# Patient Record
Sex: Female | Born: 1945 | Race: White | Hispanic: No | Marital: Married | Smoking: Never smoker
Health system: Southern US, Community
[De-identification: ages and names within clinical notes are randomized; demographics above are authoritative.]

## PROBLEM LIST (undated history)

## (undated) DIAGNOSIS — Z8489 Family history of other specified conditions: Secondary | ICD-10-CM

## (undated) DIAGNOSIS — S0300XA Dislocation of jaw, unspecified side, initial encounter: Secondary | ICD-10-CM

## (undated) DIAGNOSIS — C801 Malignant (primary) neoplasm, unspecified: Secondary | ICD-10-CM

## (undated) DIAGNOSIS — G479 Sleep disorder, unspecified: Secondary | ICD-10-CM

## (undated) DIAGNOSIS — K219 Gastro-esophageal reflux disease without esophagitis: Secondary | ICD-10-CM

## (undated) DIAGNOSIS — R5383 Other fatigue: Secondary | ICD-10-CM

## (undated) DIAGNOSIS — E739 Lactose intolerance, unspecified: Secondary | ICD-10-CM

## (undated) DIAGNOSIS — C787 Secondary malignant neoplasm of liver and intrahepatic bile duct: Secondary | ICD-10-CM

## (undated) DIAGNOSIS — L719 Rosacea, unspecified: Secondary | ICD-10-CM

## (undated) DIAGNOSIS — E7800 Pure hypercholesterolemia, unspecified: Secondary | ICD-10-CM

## (undated) DIAGNOSIS — I350 Nonrheumatic aortic (valve) stenosis: Secondary | ICD-10-CM

## (undated) DIAGNOSIS — Z803 Family history of malignant neoplasm of breast: Secondary | ICD-10-CM

## (undated) DIAGNOSIS — Z800 Family history of malignant neoplasm of digestive organs: Secondary | ICD-10-CM

## (undated) DIAGNOSIS — Z780 Asymptomatic menopausal state: Secondary | ICD-10-CM

## (undated) DIAGNOSIS — M8580 Other specified disorders of bone density and structure, unspecified site: Secondary | ICD-10-CM

## (undated) DIAGNOSIS — G40909 Epilepsy, unspecified, not intractable, without status epilepticus: Secondary | ICD-10-CM

## (undated) DIAGNOSIS — I2510 Atherosclerotic heart disease of native coronary artery without angina pectoris: Secondary | ICD-10-CM

## (undated) HISTORY — DX: Other fatigue: R53.83

## (undated) HISTORY — PX: HEMANGIOMA W/ LASER EXCISION: SHX1735

## (undated) HISTORY — DX: Lactose intolerance, unspecified: E73.9

## (undated) HISTORY — PX: CORONARY ANGIOPLASTY: SHX604

## (undated) HISTORY — DX: Rosacea, unspecified: L71.9

## (undated) HISTORY — DX: Secondary malignant neoplasm of liver and intrahepatic bile duct: C78.7

## (undated) HISTORY — PX: ANKLE FRACTURE SURGERY: SHX122

## (undated) HISTORY — DX: Nonrheumatic aortic (valve) stenosis: I35.0

## (undated) HISTORY — DX: Family history of malignant neoplasm of digestive organs: Z80.0

## (undated) HISTORY — PX: TONSILLECTOMY: SUR1361

## (undated) HISTORY — DX: Pure hypercholesterolemia, unspecified: E78.00

## (undated) HISTORY — DX: Family history of malignant neoplasm of breast: Z80.3

## (undated) HISTORY — DX: Malignant (primary) neoplasm, unspecified: C80.1

## (undated) HISTORY — DX: Dislocation of jaw, unspecified side, initial encounter: S03.00XA

## (undated) HISTORY — DX: Other specified disorders of bone density and structure, unspecified site: M85.80

## (undated) HISTORY — PX: HEMANGIOMA EXCISION: SHX1734

## (undated) HISTORY — DX: Asymptomatic menopausal state: Z78.0

## (undated) HISTORY — DX: Sleep disorder, unspecified: G47.9

---

## 1997-12-07 ENCOUNTER — Other Ambulatory Visit: Admission: RE | Admit: 1997-12-07 | Discharge: 1997-12-07 | Payer: Self-pay | Admitting: *Deleted

## 1999-03-14 ENCOUNTER — Other Ambulatory Visit: Admission: RE | Admit: 1999-03-14 | Discharge: 1999-03-14 | Payer: Self-pay | Admitting: Family Medicine

## 2000-12-26 ENCOUNTER — Encounter: Payer: Self-pay | Admitting: Family Medicine

## 2000-12-26 ENCOUNTER — Ambulatory Visit (HOSPITAL_COMMUNITY): Admission: RE | Admit: 2000-12-26 | Discharge: 2000-12-26 | Payer: Self-pay | Admitting: Family Medicine

## 2001-01-29 ENCOUNTER — Encounter: Payer: Self-pay | Admitting: Family Medicine

## 2001-01-29 ENCOUNTER — Ambulatory Visit (HOSPITAL_COMMUNITY): Admission: RE | Admit: 2001-01-29 | Discharge: 2001-01-29 | Payer: Self-pay | Admitting: Family Medicine

## 2001-05-26 ENCOUNTER — Ambulatory Visit (HOSPITAL_COMMUNITY): Admission: RE | Admit: 2001-05-26 | Discharge: 2001-05-26 | Payer: Self-pay | Admitting: Gastroenterology

## 2004-02-29 ENCOUNTER — Other Ambulatory Visit: Admission: RE | Admit: 2004-02-29 | Discharge: 2004-02-29 | Payer: Self-pay | Admitting: Family Medicine

## 2005-03-12 ENCOUNTER — Other Ambulatory Visit: Admission: RE | Admit: 2005-03-12 | Discharge: 2005-03-12 | Payer: Self-pay | Admitting: Family Medicine

## 2006-03-21 ENCOUNTER — Other Ambulatory Visit: Admission: RE | Admit: 2006-03-21 | Discharge: 2006-03-21 | Payer: Self-pay | Admitting: *Deleted

## 2007-03-31 ENCOUNTER — Other Ambulatory Visit: Admission: RE | Admit: 2007-03-31 | Discharge: 2007-03-31 | Payer: Self-pay | Admitting: Family Medicine

## 2008-06-08 ENCOUNTER — Encounter: Admission: RE | Admit: 2008-06-08 | Discharge: 2008-06-08 | Payer: Self-pay | Admitting: Neurology

## 2008-12-21 ENCOUNTER — Other Ambulatory Visit: Admission: RE | Admit: 2008-12-21 | Discharge: 2008-12-21 | Payer: Self-pay | Admitting: Family Medicine

## 2010-09-03 ENCOUNTER — Encounter: Payer: Self-pay | Admitting: Neurology

## 2010-12-29 NOTE — Procedures (Signed)
[facility]  Patient:    Judy Cummings, Judy Cummings Visit Number: [id number] MRN: [id number]          Service Type: END Location: ENDO Attending Physician:  Orland Mustard Dictated by:   Llana Aliment. Randa Evens, M.D. Proc. Date: 05/26/01 Admit Date:  05/26/2001   CC:         Arvella Merles, M.D.   Procedure Report  PROCEDURE PERFORMED:  Colonoscopy.  MEDICATIONS:  Fentanyl 75 mcg, Versed 6 mg IV  INDICATION:  A strong family history of colon cancer.  Grandfather and brother both died of colon cancer.  SCOPE:  Olympus pediatric video colonoscope  DESCRIPTION OF PROCEDURE:  The procedure had been explained to the patient and consent obtained.  The patient in the left lateral decubitus position, the Olympus video colonoscope pediatric type was inserted and advanced under direct visualization. Prep was excellent able to reach the cecum without difficulty.  The ileocecal valve and appendiceal orifice were seen.  The scope was withdrawn and the cecum, ascending colon, hepatic flexure, transverse colon, splenic flexure, descending and sigmoid colon were seen well.  No polyps or any other lesions seen. There is no significant diverticular disease.  The scope was withdrawn.  The patient tolerated the procedure well. He was maintained on low flow oxygen and pulse oximetry throughout the procedure with no obvious problems.  ASSESSMENT:  Normal colonoscopy and no colon polyps in a woman with a strong family history of colon cancer.  PLAN:  Will recommend repeating in five years and yearly hemoccults. Dictated by:   Llana Aliment. Randa Evens, M.D. Attending Physician:  Orland Mustard DD:  05/26/01 TD:  05/26/01 Job: [id number]

## 2012-03-06 ENCOUNTER — Other Ambulatory Visit (HOSPITAL_COMMUNITY)
Admission: RE | Admit: 2012-03-06 | Discharge: 2012-03-06 | Disposition: A | Discharge status: Home / Self Care | Payer: Self-pay | Source: Ambulatory Visit | Attending: Family Medicine | Admitting: Family Medicine

## 2012-03-06 ENCOUNTER — Other Ambulatory Visit: Payer: Self-pay | Admitting: Physician Assistant

## 2012-03-06 DIAGNOSIS — Z0000 Encounter for general adult medical examination without abnormal findings: Secondary | ICD-10-CM | POA: Insufficient documentation

## 2014-01-27 DIAGNOSIS — E739 Lactose intolerance, unspecified: Secondary | ICD-10-CM | POA: Insufficient documentation

## 2014-01-27 DIAGNOSIS — E785 Hyperlipidemia, unspecified: Secondary | ICD-10-CM | POA: Insufficient documentation

## 2014-01-27 DIAGNOSIS — R569 Unspecified convulsions: Secondary | ICD-10-CM | POA: Insufficient documentation

## 2014-01-27 DIAGNOSIS — Z780 Asymptomatic menopausal state: Secondary | ICD-10-CM | POA: Insufficient documentation

## 2014-01-27 DIAGNOSIS — M8580 Other specified disorders of bone density and structure, unspecified site: Secondary | ICD-10-CM | POA: Insufficient documentation

## 2014-01-27 DIAGNOSIS — L719 Rosacea, unspecified: Secondary | ICD-10-CM | POA: Insufficient documentation

## 2014-01-27 DIAGNOSIS — E7800 Pure hypercholesterolemia, unspecified: Secondary | ICD-10-CM

## 2015-01-12 ENCOUNTER — Encounter: Payer: Self-pay | Admitting: Cardiology

## 2015-01-12 ENCOUNTER — Ambulatory Visit (INDEPENDENT_AMBULATORY_CARE_PROVIDER_SITE_OTHER): Payer: Medicare PPO | Admitting: Cardiology

## 2015-01-12 VITALS — BP 158/88 | HR 90 | Ht 63.0 in | Wt 122.0 lb

## 2015-01-12 DIAGNOSIS — R002 Palpitations: Secondary | ICD-10-CM | POA: Diagnosis not present

## 2015-01-12 DIAGNOSIS — T733XXA Exhaustion due to excessive exertion, initial encounter: Secondary | ICD-10-CM | POA: Insufficient documentation

## 2015-01-12 DIAGNOSIS — R0609 Other forms of dyspnea: Secondary | ICD-10-CM

## 2015-01-12 DIAGNOSIS — R011 Cardiac murmur, unspecified: Secondary | ICD-10-CM | POA: Insufficient documentation

## 2015-01-12 DIAGNOSIS — R0989 Other specified symptoms and signs involving the circulatory and respiratory systems: Secondary | ICD-10-CM

## 2015-01-12 NOTE — Patient Instructions (Signed)
Medication Instructions:  Your physician recommends that you continue on your current medications as directed. Please refer to the Current Medication list given to you today.   Labwork: None  Testing/Procedures: Your physician has requested that you have an echocardiogram. Echocardiography is a painless test that uses sound waves to create images of your heart. It provides your doctor with information about the size and shape of your heart and how well your heart's chambers and valves are working. This procedure takes approximately one hour. There are no restrictions for this procedure.   Your physician has requested that you have a carotid duplex. This test is an ultrasound of the carotid arteries in your neck. It looks at blood flow through these arteries that supply the brain with blood. Allow one hour for this exam. There are no restrictions or special instructions.   Dr. Radford Pax recommends you have a STRESS MYOVIEW.  Your physician has recommended that you wear an event monitor. Event monitors are medical devices that record the heart's electrical activity. Doctors most often [place] these monitors to diagnose arrhythmias. Arrhythmias are problems with the speed or rhythm of the heartbeat. The monitor is a small, portable device. You can wear one while you do your normal daily activities. This is usually used to diagnose what is causing palpitations/syncope (passing out).  Follow-Up: Your physician recommends that you schedule a follow-up appointment AS NEEDED with Dr. Radford Pax pending your study results.  Any Other Special Instructions Will Be Listed Below (If Applicable).

## 2015-01-12 NOTE — Progress Notes (Signed)
Cardiology Office Note   Date:  01/12/2015   ID:  Judy Cummings, DOB [birth year], MRN [id number]  PCP:  No primary care provider on file.    Chief Complaint  Patient presents with  . New Evaluation    extreme fatigue      History of Present Illness: Judy Cummings is a 69 y.o. female who presents for evaluation of exertional fatigue.  She also has felt like her heart is not beating fast enough to keep up with her and she felt SOB.  These symptoms typically occur after she has been working out in the yard for a few days, keeping grandkids and running errand.  The next day she will feel exhausted and felt that her heart was not beating fast enough.  She denies any chest pain or pressure.  She has noticed some exertional SOB when walking up a hill but is able to ride a bike on flat ground without any problems.  She has felt dizzy sometimes when out doing yard work even walking across the yard.  She denies any LE edema, PND, orthopnea or syncope.  She had a stomach flu in April and since then has had a lot of reflux symptoms with indigestion and gagging.    Past Medical History  Diagnosis Date  . Seizures   . Osteopenia   . Hypercholesterolemia   . Menopause   . Rosacea   . Lactose intolerance   . TMJ (dislocation of temporomandibular joint)   . Fatigue   . Rosacea   . TMJ (dislocation of temporomandibular joint)     Past Surgical History  Procedure Laterality Date  . Cosmetic surgery       Current Outpatient Prescriptions  Medication Sig Dispense Refill  . acetaminophen (TYLENOL) 325 MG tablet Take 650 mg by mouth every 6 (six) hours as needed for mild pain.    . Azelaic Acid 15 % cream 1 application to affected area    . carbamazepine (TEGRETOL XR) 400 MG 12 hr tablet Take 400 mg by mouth daily.    . fluticasone (FLONASE) 50 MCG/ACT nasal spray Place 1 spray into both nostrils daily. 1 spray in each nostril    . TEGRETOL-XR 200 MG 12 hr tablet Take 200 mg by  mouth daily.     . zaleplon (SONATA) 5 MG capsule Take 5 mg by mouth at bedtime as needed for sleep.     No current facility-administered medications for this visit.    Allergies:   Boniva; Erythromycin; Fosamax; Glucosamine forte; Guaifenesin & derivatives; Nexium; and Zostavax    Social History:  The patient  reports that she has never smoked. She does not have any smokeless tobacco history on file. She reports that she does not drink alcohol or use illicit drugs.   Family History:  The patient's family history includes Atrial fibrillation in her mother; CAD in her father; CVA in her father and paternal grandmother; Colon cancer in her paternal grandfather; Diabetes Mellitus I in her mother; Heart attack in her father and maternal grandfather; Hyperlipidemia in her sister; Hypertension in her mother.    ROS:  Please see the history of present illness.   Otherwise, review of systems are positive for none.   All other systems are reviewed and negative.    PHYSICAL EXAM: VS:  BP 158/88 mmHg  Pulse 90  Ht 5\' 3"  (1.6 m)  Wt  122 lb (55.339 kg)  BMI 21.62 kg/m2  SpO2 97% , BMI Body mass index is 21.62 kg/(m^2). GEN: Well nourished, well developed, in no acute distress HEENT: normal Neck: no JVD or masses.  Left carotid artery bruit Cardiac: RRR; no rubs, or gallops,no edema.  1/6 SM at RUSB to LLSB Respiratory:  clear to auscultation bilaterally, normal work of breathing GI: soft, nontender, nondistended, + BS MS: no deformity or atrophy Skin: warm and dry, no rash Neuro:  Strength and sensation are intact Psych: euthymic mood, full affect   EKG:  EKG is not ordered today.    Recent Labs: No results found for requested labs within last 365 days.    Lipid Panel No results found for: CHOL, TRIG, HDL, CHOLHDL, VLDL, LDLCALC, LDLDIRECT    Wt Readings from Last 3 Encounters:  01/12/15 122 lb (55.339 kg)       ASSESSMENT AND PLAN:  1.  Palpitations that she describes as  her heart not being able to keep up with her activities.  I will get a 30 day heart monitor to assess for arrhythmias 2.  DOE that is new for her.  It has gotten to the point that walking up any incline causes SOB.  She has a family history of CAD with her Dad having an MI at age 9.  Her mom has afib.  She has a history of dyslipidemia. She has never smoked.  Her EKG done at her PCP office is normal.  I will get a stress myoview to rule out ischemia and 2D echo to assess LVF and look for diastolic dysfunction. 3.  Exertional fagitue with Family history of CAD 4.  GERD 5.  Left carotid artery bruit - I will check carotid arterial dopplers 6.  Heart murmur - check 2D echo   Current medicines are reviewed at length with the patient today.  The patient does not have concerns regarding medicines.  The following changes have been made:  no change  Labs/ tests ordered today: See above Assessment and Plan No orders of the defined types were placed in this encounter.     Disposition:   FU with me in PRN pending results of studies  Signed, [name], MD  01/12/2015 1:35 PM    [facility] [place], [place], [place]  [address] Phone: [phone]; Fax: [phone]

## 2015-01-31 ENCOUNTER — Encounter: Payer: Self-pay | Admitting: Cardiology

## 2015-01-31 ENCOUNTER — Ambulatory Visit (HOSPITAL_COMMUNITY): Payer: Medicare PPO | Attending: Cardiology

## 2015-01-31 ENCOUNTER — Other Ambulatory Visit: Payer: Self-pay

## 2015-01-31 ENCOUNTER — Ambulatory Visit (HOSPITAL_BASED_OUTPATIENT_CLINIC_OR_DEPARTMENT_OTHER): Payer: Medicare PPO

## 2015-01-31 ENCOUNTER — Telehealth (HOSPITAL_COMMUNITY): Payer: Self-pay | Admitting: *Deleted

## 2015-01-31 DIAGNOSIS — I350 Nonrheumatic aortic (valve) stenosis: Secondary | ICD-10-CM | POA: Insufficient documentation

## 2015-01-31 DIAGNOSIS — I6523 Occlusion and stenosis of bilateral carotid arteries: Secondary | ICD-10-CM | POA: Insufficient documentation

## 2015-01-31 DIAGNOSIS — Z87891 Personal history of nicotine dependence: Secondary | ICD-10-CM | POA: Insufficient documentation

## 2015-01-31 DIAGNOSIS — R0989 Other specified symptoms and signs involving the circulatory and respiratory systems: Secondary | ICD-10-CM

## 2015-01-31 DIAGNOSIS — E785 Hyperlipidemia, unspecified: Secondary | ICD-10-CM | POA: Insufficient documentation

## 2015-01-31 DIAGNOSIS — R011 Cardiac murmur, unspecified: Secondary | ICD-10-CM

## 2015-01-31 DIAGNOSIS — R42 Dizziness and giddiness: Secondary | ICD-10-CM | POA: Diagnosis not present

## 2015-01-31 NOTE — Telephone Encounter (Signed)
Patient given detailed instructions per Myocardial Perfusion Study Information Sheet for test on 02/02/15 at 0730. Patient Notified to arrive 15 minutes early, and that it is imperative to arrive on time for appointment to keep from having the test rescheduled. Patient verbalized understanding. Malita Ignasiak, Ranae Palms

## 2015-01-31 NOTE — Telephone Encounter (Signed)
Left message on voicemail in reference to upcoming appointment scheduled for 02/02/15. Phone number given for a call back so details instructions can be given. Judy Cummings   

## 2015-02-01 ENCOUNTER — Telehealth: Payer: Self-pay

## 2015-02-01 DIAGNOSIS — I6523 Occlusion and stenosis of bilateral carotid arteries: Secondary | ICD-10-CM

## 2015-02-02 ENCOUNTER — Ambulatory Visit (HOSPITAL_COMMUNITY): Payer: Medicare PPO | Attending: Cardiology

## 2015-02-02 ENCOUNTER — Ambulatory Visit (INDEPENDENT_AMBULATORY_CARE_PROVIDER_SITE_OTHER): Payer: Medicare PPO

## 2015-02-02 DIAGNOSIS — R002 Palpitations: Secondary | ICD-10-CM | POA: Insufficient documentation

## 2015-02-02 DIAGNOSIS — R0609 Other forms of dyspnea: Secondary | ICD-10-CM | POA: Insufficient documentation

## 2015-02-02 DIAGNOSIS — I779 Disorder of arteries and arterioles, unspecified: Secondary | ICD-10-CM | POA: Diagnosis not present

## 2015-02-02 LAB — MYOCARDIAL PERFUSION IMAGING
CHL CUP NUCLEAR SSS: 6
CSEPEW: 10.4 METS
CSEPPHR: 134 {beats}/min
Exercise duration (min): 9 min
Exercise duration (sec): 16 s
LHR: 0.22
LV dias vol: 56 mL
LVSYSVOL: 20 mL
MPHR: 153 {beats}/min
Percent HR: 87 %
Rest HR: 75 {beats}/min
SDS: 6
SRS: 0
TID: 0.93

## 2015-02-02 MED ORDER — TECHNETIUM TC 99M SESTAMIBI GENERIC - CARDIOLITE
11.0000 | Freq: Once | INTRAVENOUS | Status: AC | PRN
  Administered 2015-02-02: 11 via INTRAVENOUS

## 2015-02-02 MED ORDER — TECHNETIUM TC 99M SESTAMIBI GENERIC - CARDIOLITE
33.0000 | Freq: Once | INTRAVENOUS | Status: AC | PRN
  Administered 2015-02-02: 33 via INTRAVENOUS

## 2015-02-02 MED ORDER — ASPIRIN EC 81 MG PO TBEC: 81.0000 mg | DELAYED_RELEASE_TABLET | Freq: Every day | ORAL | Status: DC

## 2015-02-02 NOTE — Telephone Encounter (Signed)
-----   Message from [name], MD sent at 01/31/2015  3:29 PM EDT ----- 40-59% carotid artery stenosis bilaterally - repeat study in 1 year

## 2015-02-02 NOTE — Telephone Encounter (Signed)
See result note.  Repeat duplex ordered to be scheduled in 1 year.  Patient knows to start 81 mg ASA daily.

## 2015-02-03 ENCOUNTER — Other Ambulatory Visit: Payer: Self-pay | Admitting: Cardiology

## 2015-02-03 ENCOUNTER — Encounter (HOSPITAL_COMMUNITY): Payer: Self-pay | Admitting: Pharmacy Technician

## 2015-02-03 ENCOUNTER — Telehealth: Payer: Self-pay

## 2015-02-03 DIAGNOSIS — R9439 Abnormal result of other cardiovascular function study: Secondary | ICD-10-CM

## 2015-02-03 DIAGNOSIS — Z01812 Encounter for preprocedural laboratory examination: Secondary | ICD-10-CM

## 2015-02-03 NOTE — Telephone Encounter (Signed)
-----   Message from [name], MD sent at 02/02/2015  7:27 PM EDT ----- Abnormal stress test with positive EKG for ischemia and nuclear with inferior ischemia - needs to be set up for Cath ASAP

## 2015-02-03 NOTE — Telephone Encounter (Signed)
Informed patient of results and verbal understanding expressed.  Scheduled patient for cath on Monday, June 27 with Dr. Angelena Form. Pre-procedure labwork scheduled for tomorrow. Patient understands to pick up instruction letter at check-in tomorrow.

## 2015-02-04 ENCOUNTER — Telehealth: Payer: Self-pay

## 2015-02-04 ENCOUNTER — Other Ambulatory Visit (INDEPENDENT_AMBULATORY_CARE_PROVIDER_SITE_OTHER): Payer: Medicare PPO | Admitting: *Deleted

## 2015-02-04 DIAGNOSIS — R931 Abnormal findings on diagnostic imaging of heart and coronary circulation: Secondary | ICD-10-CM | POA: Diagnosis not present

## 2015-02-04 DIAGNOSIS — R9439 Abnormal result of other cardiovascular function study: Secondary | ICD-10-CM

## 2015-02-04 DIAGNOSIS — Z01812 Encounter for preprocedural laboratory examination: Secondary | ICD-10-CM | POA: Diagnosis not present

## 2015-02-04 DIAGNOSIS — Z5181 Encounter for therapeutic drug level monitoring: Secondary | ICD-10-CM

## 2015-02-04 LAB — APTT: APTT: 25.9 s (ref 23.4–32.7)

## 2015-02-04 LAB — CBC WITH DIFFERENTIAL/PLATELET
Basophils Absolute: 0 10*3/uL (ref 0.0–0.1)
Basophils Relative: 0.3 % (ref 0.0–3.0)
EOS ABS: 0.2 10*3/uL (ref 0.0–0.7)
Eosinophils Relative: 2.6 % (ref 0.0–5.0)
HCT: 37.6 % (ref 36.0–46.0)
Hemoglobin: 12.4 g/dL (ref 12.0–15.0)
Lymphocytes Relative: 9.2 % — ABNORMAL LOW (ref 12.0–46.0)
Lymphs Abs: 0.8 10*3/uL (ref 0.7–4.0)
MCHC: 33.1 g/dL (ref 30.0–36.0)
MCV: 94.6 fl (ref 78.0–100.0)
MONO ABS: 0.8 10*3/uL (ref 0.1–1.0)
Monocytes Relative: 9.7 % (ref 3.0–12.0)
Neutro Abs: 6.6 10*3/uL (ref 1.4–7.7)
Neutrophils Relative %: 78.2 % — ABNORMAL HIGH (ref 43.0–77.0)
PLATELETS: 204 10*3/uL (ref 150.0–400.0)
RBC: 3.98 Mil/uL (ref 3.87–5.11)
RDW: 15.4 % (ref 11.5–15.5)
WBC: 8.4 10*3/uL (ref 4.0–10.5)

## 2015-02-04 LAB — BASIC METABOLIC PANEL
BUN: 8 mg/dL (ref 6–23)
CO2: 33 mEq/L — ABNORMAL HIGH (ref 19–32)
Calcium: 9.1 mg/dL (ref 8.4–10.5)
Chloride: 100 mEq/L (ref 96–112)
Creatinine, Ser: 0.58 mg/dL (ref 0.40–1.20)
GFR: 110.17 mL/min (ref >60.00)
GLUCOSE: 85 mg/dL (ref 70–99)
Potassium: 3.8 mEq/L (ref 3.5–5.1)
SODIUM: 137 meq/L (ref 135–145)

## 2015-02-04 LAB — PROTIME-INR
INR: 1.1 ratio — AB (ref 0.8–1.0)
Prothrombin Time: 12.1 s (ref 9.6–13.1)

## 2015-02-04 NOTE — Telephone Encounter (Signed)
Patient came in to have lab work done. Patient concerned about having elevated liver labs about a week ago with [facility] with Dr. Jonny Ruiz. Informed patient that with heart cath. that the office checks regularly BMET, CBC and INR/PT. Informed patient that I would let her primary cardiologist know about her concerns. Patient had no other questions. Will forward to Dr. Radford Pax and Dr. Standley Brooking, who is doing the heart cath.

## 2015-02-04 NOTE — Telephone Encounter (Signed)
Spoke with patient who states she had elevated liver enzymes per PCP, believed to be related to interaction between Tegretol and Omeprazole.  She was advised to stop Omeprazole and have liver enzymes rechecked in 1 week. I advised her that the cardiac cath will not interfere with her elevated liver enzymes and to follow her PCP's advice.  I advised her that she will receive sedation and be made comfortable but not put to sleep for her cath.  She verbalized understanding and agreement and thanked me for the call

## 2015-02-04 NOTE — Telephone Encounter (Signed)
Left message for patient to call back  

## 2015-02-04 NOTE — Telephone Encounter (Signed)
Follow Up      Pt calling stating that she was scheduled for a heart cath for Monday and wants to know if she will be given a medication to help her relax because she will be very anxious. Pt also states that about a week ago her PCP did some blood work and her liver levels were elivated, is she ok for surgery? Pt states her PCP will be checking those levels again in 1 week. Please call back and advise.

## 2015-02-07 ENCOUNTER — Encounter (HOSPITAL_COMMUNITY): Payer: Self-pay | Admitting: Cardiovascular Disease

## 2015-02-07 ENCOUNTER — Ambulatory Visit (HOSPITAL_COMMUNITY)
Admission: RE | Admit: 2015-02-07 | Discharge: 2015-02-08 | Disposition: A | Discharge status: Home / Self Care | Payer: Medicare PPO | Source: Ambulatory Visit | Attending: Cardiovascular Disease | Admitting: Cardiovascular Disease

## 2015-02-07 ENCOUNTER — Encounter (HOSPITAL_COMMUNITY)
Admission: RE | Disposition: A | Discharge status: Home / Self Care | Payer: Medicare PPO | Source: Ambulatory Visit | Attending: Cardiovascular Disease

## 2015-02-07 DIAGNOSIS — Z8249 Family history of ischemic heart disease and other diseases of the circulatory system: Secondary | ICD-10-CM | POA: Insufficient documentation

## 2015-02-07 DIAGNOSIS — K219 Gastro-esophageal reflux disease without esophagitis: Secondary | ICD-10-CM | POA: Insufficient documentation

## 2015-02-07 DIAGNOSIS — E780 Pure hypercholesterolemia: Secondary | ICD-10-CM | POA: Diagnosis not present

## 2015-02-07 DIAGNOSIS — I200 Unstable angina: Secondary | ICD-10-CM | POA: Diagnosis present

## 2015-02-07 DIAGNOSIS — Z955 Presence of coronary angioplasty implant and graft: Secondary | ICD-10-CM

## 2015-02-07 DIAGNOSIS — I10 Essential (primary) hypertension: Secondary | ICD-10-CM | POA: Diagnosis present

## 2015-02-07 DIAGNOSIS — R9439 Abnormal result of other cardiovascular function study: Secondary | ICD-10-CM

## 2015-02-07 DIAGNOSIS — R011 Cardiac murmur, unspecified: Secondary | ICD-10-CM | POA: Diagnosis not present

## 2015-02-07 DIAGNOSIS — R0609 Other forms of dyspnea: Secondary | ICD-10-CM

## 2015-02-07 DIAGNOSIS — E785 Hyperlipidemia, unspecified: Secondary | ICD-10-CM | POA: Diagnosis present

## 2015-02-07 DIAGNOSIS — I25119 Atherosclerotic heart disease of native coronary artery with unspecified angina pectoris: Secondary | ICD-10-CM | POA: Diagnosis present

## 2015-02-07 DIAGNOSIS — I25110 Atherosclerotic heart disease of native coronary artery with unstable angina pectoris: Secondary | ICD-10-CM | POA: Diagnosis not present

## 2015-02-07 HISTORY — PX: CARDIAC CATHETERIZATION: SHX172

## 2015-02-07 HISTORY — DX: Epilepsy, unspecified, not intractable, without status epilepticus: G40.909

## 2015-02-07 HISTORY — DX: Atherosclerotic heart disease of native coronary artery without angina pectoris: I25.10

## 2015-02-07 HISTORY — DX: Gastro-esophageal reflux disease without esophagitis: K21.9

## 2015-02-07 HISTORY — DX: Family history of other specified conditions: Z84.89

## 2015-02-07 LAB — POCT ACTIVATED CLOTTING TIME
Activated Clotting Time: 313 seconds
Activated Clotting Time: 325 seconds
Activated Clotting Time: 319 seconds

## 2015-02-07 SURGERY — LEFT HEART CATH AND CORONARY ANGIOGRAPHY
Anesthesia: LOCAL

## 2015-02-07 MED ORDER — HEPARIN SODIUM (PORCINE) 1000 UNIT/ML IJ SOLN
INTRAMUSCULAR | Status: AC
  Filled 2015-02-07: qty 1

## 2015-02-07 MED ORDER — CARBAMAZEPINE ER 200 MG PO TB12: 200.0000 mg | ORAL_TABLET | Freq: Every day | ORAL | Status: DC

## 2015-02-07 MED ORDER — MIDAZOLAM HCL 2 MG/2ML IJ SOLN
INTRAMUSCULAR | Status: AC
Start: 2015-02-07 — End: 2015-02-07
  Filled 2015-02-07: qty 2

## 2015-02-07 MED ORDER — FENTANYL CITRATE (PF) 100 MCG/2ML IJ SOLN
INTRAMUSCULAR | Status: AC
  Filled 2015-02-07: qty 2

## 2015-02-07 MED ORDER — CLOPIDOGREL BISULFATE 300 MG PO TABS
ORAL_TABLET | ORAL | Status: AC
  Filled 2015-02-07: qty 1

## 2015-02-07 MED ORDER — CARBAMAZEPINE ER 100 MG PO TB12
200.0000 mg | ORAL_TABLET | Freq: Every day | ORAL | Status: DC
  Administered 2015-02-08: 09:00:00 200 mg via ORAL
  Filled 2015-02-07: qty 2
  Filled 2015-02-07: qty 1

## 2015-02-07 MED ORDER — ACETAMINOPHEN 325 MG PO TABS
650.0000 mg | ORAL_TABLET | ORAL | Status: DC | PRN
  Administered 2015-02-08: 06:00:00 650 mg via ORAL
  Filled 2015-02-07: qty 2

## 2015-02-07 MED ORDER — ASPIRIN EC 81 MG PO TBEC
81.0000 mg | DELAYED_RELEASE_TABLET | Freq: Every day | ORAL | Status: DC
  Administered 2015-02-08: 09:00:00 81 mg via ORAL
  Filled 2015-02-07: qty 1

## 2015-02-07 MED ORDER — HEPARIN (PORCINE) IN NACL 2-0.9 UNIT/ML-% IJ SOLN
INTRAMUSCULAR | Status: AC
  Filled 2015-02-07: qty 1000

## 2015-02-07 MED ORDER — CARBAMAZEPINE ER 400 MG PO TB12: 600.0000 mg | ORAL_TABLET | Freq: Every day | ORAL | Status: DC

## 2015-02-07 MED ORDER — CARBAMAZEPINE ER 100 MG PO TB12
400.0000 mg | ORAL_TABLET | Freq: Every day | ORAL | Status: DC
  Administered 2015-02-07: 21:00:00 400 mg via ORAL
  Filled 2015-02-07: qty 4
  Filled 2015-02-07: qty 1

## 2015-02-07 MED ORDER — SODIUM CHLORIDE 0.9 % WEIGHT BASED INFUSION: 1.0000 mL/kg/h | INTRAVENOUS | Status: DC

## 2015-02-07 MED ORDER — ATORVASTATIN CALCIUM 80 MG PO TABS
80.0000 mg | ORAL_TABLET | Freq: Every day | ORAL | Status: DC
  Administered 2015-02-07: 21:00:00 80 mg via ORAL
  Filled 2015-02-07: qty 1

## 2015-02-07 MED ORDER — MIDAZOLAM HCL 2 MG/2ML IJ SOLN
INTRAMUSCULAR | Status: AC
  Filled 2015-02-07: qty 2

## 2015-02-07 MED ORDER — ASPIRIN 81 MG PO CHEW: 81.0000 mg | CHEWABLE_TABLET | ORAL | Status: DC

## 2015-02-07 MED ORDER — SODIUM CHLORIDE 0.9 % IV SOLN
250.0000 mL | INTRAVENOUS | Status: DC | PRN
Start: 2015-02-07 — End: 2015-02-07

## 2015-02-07 MED ORDER — VERAPAMIL HCL 2.5 MG/ML IV SOLN
INTRAVENOUS | Status: AC
  Filled 2015-02-07: qty 2

## 2015-02-07 MED ORDER — SODIUM CHLORIDE 0.9 % IV SOLN: 250.0000 mL | INTRAVENOUS | Status: DC | PRN

## 2015-02-07 MED ORDER — CARBAMAZEPINE ER 200 MG PO TB12
200.0000 mg | ORAL_TABLET | Freq: Every day | ORAL | Status: DC
  Filled 2015-02-07 (×2): qty 1

## 2015-02-07 MED ORDER — SODIUM CHLORIDE 0.9 % IV SOLN: INTRAVENOUS | Status: AC

## 2015-02-07 MED ORDER — CLOPIDOGREL BISULFATE 300 MG PO TABS
ORAL_TABLET | ORAL | Status: DC | PRN
  Administered 2015-02-07: 600 mg via ORAL

## 2015-02-07 MED ORDER — SODIUM CHLORIDE 0.9 % IJ SOLN: 3.0000 mL | Freq: Two times a day (BID) | INTRAMUSCULAR | Status: DC

## 2015-02-07 MED ORDER — SODIUM CHLORIDE 0.9 % IJ SOLN
3.0000 mL | Freq: Two times a day (BID) | INTRAMUSCULAR | Status: DC
  Administered 2015-02-07: 3 mL via INTRAVENOUS

## 2015-02-07 MED ORDER — FENTANYL CITRATE (PF) 100 MCG/2ML IJ SOLN
INTRAMUSCULAR | Status: DC | PRN
  Administered 2015-02-07 (×2): 25 ug via INTRAVENOUS
  Administered 2015-02-07: 50 ug via INTRAVENOUS

## 2015-02-07 MED ORDER — SODIUM CHLORIDE 0.9 % WEIGHT BASED INFUSION
3.0000 mL/kg/h | INTRAVENOUS | Status: DC
Start: 2015-02-08 — End: 2015-02-07

## 2015-02-07 MED ORDER — ANGIOPLASTY BOOK
Freq: Once | Status: AC
  Administered 2015-02-07: 20:00:00
  Filled 2015-02-07: qty 1

## 2015-02-07 MED ORDER — SODIUM CHLORIDE 0.9 % IJ SOLN: 3.0000 mL | INTRAMUSCULAR | Status: DC | PRN

## 2015-02-07 MED ORDER — CLOPIDOGREL BISULFATE 75 MG PO TABS
75.0000 mg | ORAL_TABLET | Freq: Every day | ORAL | Status: DC
  Filled 2015-02-07: qty 1

## 2015-02-07 MED ORDER — CARBAMAZEPINE ER 400 MG PO TB12: 400.0000 mg | ORAL_TABLET | Freq: Every day | ORAL | Status: DC

## 2015-02-07 MED ORDER — LIDOCAINE HCL (PF) 1 % IJ SOLN
INTRAMUSCULAR | Status: AC
  Filled 2015-02-07: qty 30

## 2015-02-07 MED ORDER — ALUM & MAG HYDROXIDE-SIMETH 200-200-20 MG/5ML PO SUSP
30.0000 mL | ORAL | Status: DC | PRN
  Administered 2015-02-07: 30 mL via ORAL
  Filled 2015-02-07 (×2): qty 30

## 2015-02-07 MED ORDER — NITROGLYCERIN 1 MG/10 ML FOR IR/CATH LAB
INTRA_ARTERIAL | Status: DC | PRN
  Administered 2015-02-07 (×2): 200 ug via INTRACORONARY
  Administered 2015-02-07: 100 ug via INTRACORONARY
  Administered 2015-02-07: 200 ug via INTRACORONARY

## 2015-02-07 MED ORDER — VERAPAMIL HCL 2.5 MG/ML IV SOLN
INTRAVENOUS | Status: DC | PRN
  Administered 2015-02-07: 09:00:00 via INTRA_ARTERIAL

## 2015-02-07 MED ORDER — NITROGLYCERIN 1 MG/10 ML FOR IR/CATH LAB
INTRA_ARTERIAL | Status: AC
  Filled 2015-02-07: qty 10

## 2015-02-07 MED ORDER — MIDAZOLAM HCL 2 MG/2ML IJ SOLN
INTRAMUSCULAR | Status: DC | PRN
  Administered 2015-02-07: 1 mg via INTRAVENOUS
  Administered 2015-02-07: 2 mg via INTRAVENOUS
  Administered 2015-02-07: 1 mg via INTRAVENOUS

## 2015-02-07 MED ORDER — FLUTICASONE PROPIONATE 50 MCG/ACT NA SUSP
1.0000 | Freq: Every day | NASAL | Status: DC
  Filled 2015-02-07: qty 16

## 2015-02-07 MED ORDER — HEPARIN SODIUM (PORCINE) 1000 UNIT/ML IJ SOLN
INTRAMUSCULAR | Status: DC | PRN
  Administered 2015-02-07: 7000 [IU] via INTRAVENOUS
  Administered 2015-02-07: 2000 [IU] via INTRAVENOUS
  Administered 2015-02-07: 3000 [IU] via INTRAVENOUS

## 2015-02-07 MED ORDER — ONDANSETRON HCL 4 MG/2ML IJ SOLN: 4.0000 mg | Freq: Four times a day (QID) | INTRAMUSCULAR | Status: DC | PRN

## 2015-02-07 SURGICAL SUPPLY — 29 items
BALLN EMERGE MR 2.0X12 (BALLOONS) ×2
BALLN EMERGE MR 2.0X20 (BALLOONS) ×2
BALLN ~~LOC~~ EMERGE MR 2.25X20 (BALLOONS) ×2
BALLN ~~LOC~~ EMERGE MR 2.25X8 (BALLOONS) ×1 IMPLANT
BALLOON EMERGE MR 2.0X12 (BALLOONS) IMPLANT
BALLOON EMERGE MR 2.0X20 (BALLOONS) IMPLANT
BALLOON ~~LOC~~ EMERGE MR 2.25X20 (BALLOONS) IMPLANT
CATH INFINITI 5 FR JL3.5 (CATHETERS) ×2 IMPLANT
CATH INFINITI 5FR ANG PIGTAIL (CATHETERS) ×2 IMPLANT
CATH INFINITI 5FR MULTPACK ANG (CATHETERS) IMPLANT
CATH INFINITI JR4 5F (CATHETERS) ×2 IMPLANT
CATH VISTA GUIDE 6FR AL1 (CATHETERS) ×1 IMPLANT
CATH VISTA GUIDE 6FR XBLAD3.5 (CATHETERS) ×1 IMPLANT
DEVICE RAD COMP TR BAND LRG (VASCULAR PRODUCTS) ×3 IMPLANT
GLIDESHEATH SLEND SS 6F .021 (SHEATH) ×2 IMPLANT
KIT ENCORE 26 ADVANTAGE (KITS) ×1 IMPLANT
KIT HEART LEFT (KITS) ×2 IMPLANT
PACK CARDIAC CATHETERIZATION (CUSTOM PROCEDURE TRAY) ×2 IMPLANT
SHEATH PINNACLE 5F 10CM (SHEATH) IMPLANT
STENT PROMUS PREM MR 2.25X12 (Permanent Stent) ×2 IMPLANT
STENT PROMUS PREM MR 2.25X32 (Permanent Stent) ×1 IMPLANT
SYR MEDRAD MARK V 150ML (SYRINGE) ×2 IMPLANT
TRANSDUCER W/STOPCOCK (MISCELLANEOUS) ×2 IMPLANT
TUBING CIL FLEX 10 FLL-RA (TUBING) ×2 IMPLANT
WIRE COUGAR XT STRL 190CM (WIRE) ×2 IMPLANT
WIRE EMERALD 3MM-J .035X150CM (WIRE) IMPLANT
WIRE HI TORQ VERSACORE-J 145CM (WIRE) ×1 IMPLANT
WIRE MAILMAN 182CM (WIRE) ×1 IMPLANT
WIRE SAFE-T 1.5MM-J .035X260CM (WIRE) ×2 IMPLANT

## 2015-02-07 NOTE — Progress Notes (Signed)
Patient had slight bruising proximal to site on admission to unit. Bruising noted and pressure held x two with 2 cc's added to band. After 2nd episode bruised area wrapped after pressure held and area soft. 1230 wrap removed and area proximal bruised but soft. Noted swelling after wrap removed and manual pressure held. Wrapped for additional 30 minutes. When wrap removed swelling again, manual pressure held and Dr. Angelena Form paged at 1400. Called lab as instructed and cath lab personnel up to assess and treat bruising and hematoma.  Blood pressure cuff put around bruised swollen area and inflated for 2 minutes by Sherlyn Lick and area softer. Dr. Ellyn Hack into see patient and 2nd TR band applied proximal to original TR band and bands adjusted by Dr. Ellyn Hack. Instructed to slowly deflate each band distal first then proximal and apply ice to proximal area to bands for comfort. Area stable with no further swelling noted.  CSMs to right hand wnls continuously with no deficit noted and radial and ulnar pulses continuously palpable. Oximetry to right thumb continuous and good pleth noted. No deficit in circulation to extremity during shift noted. Patient reported less discomfort after intervention by [facility] and Dr. Ellyn Hack. Ice pack decreased discomfort, applied intermittently for comfort.  Removed band and noted bruising and tenderness. Area soft with no swelling or palpable hematoma at 1800.  Report to Roselle Zarsona at 1900.

## 2015-02-07 NOTE — H&P (View-Only) (Signed)
Cardiology Office Note   Date:  01/12/2015   ID:  Judy Cummings, DOB Dec 19, 1945, MRN [id number]  PCP:  No primary care provider on file.    Chief Complaint  Patient presents with  . New Evaluation    extreme fatigue      History of Present Illness: Judy Cummings is a 69 y.o. female who presents for evaluation of exertional fatigue.  She also has felt like her heart is not beating fast enough to keep up with her and she felt SOB.  These symptoms typically occur after she has been working out in the yard for a few days, keeping grandkids and running errand.  The next day she will feel exhausted and felt that her heart was not beating fast enough.  She denies any chest pain or pressure.  She has noticed some exertional SOB when walking up a hill but is able to ride a bike on flat ground without any problems.  She has felt dizzy sometimes when out doing yard work even walking across the yard.  She denies any LE edema, PND, orthopnea or syncope.  She had a stomach flu in April and since then has had a lot of reflux symptoms with indigestion and gagging.    Past Medical History  Diagnosis Date  . Seizures   . Osteopenia   . Hypercholesterolemia   . Menopause   . Rosacea   . Lactose intolerance   . TMJ (dislocation of temporomandibular joint)   . Fatigue   . Rosacea   . TMJ (dislocation of temporomandibular joint)     Past Surgical History  Procedure Laterality Date  . Cosmetic surgery       Current Outpatient Prescriptions  Medication Sig Dispense Refill  . acetaminophen (TYLENOL) 325 MG tablet Take 650 mg by mouth every 6 (six) hours as needed for mild pain.    . Azelaic Acid 15 % cream 1 application to affected area    . carbamazepine (TEGRETOL XR) 400 MG 12 hr tablet Take 400 mg by mouth daily.    . fluticasone (FLONASE) 50 MCG/ACT nasal spray Place 1 spray into both nostrils daily. 1 spray in each nostril    . TEGRETOL-XR 200 MG 12 hr tablet Take 200 mg by  mouth daily.     . zaleplon (SONATA) 5 MG capsule Take 5 mg by mouth at bedtime as needed for sleep.     No current facility-administered medications for this visit.    Allergies:   Boniva; Erythromycin; Fosamax; Glucosamine forte; Guaifenesin & derivatives; Nexium; and Zostavax    Social History:  The patient  reports that she has never smoked. She does not have any smokeless tobacco history on file. She reports that she does not drink alcohol or use illicit drugs.   Family History:  The patient's family history includes Atrial fibrillation in her mother; CAD in her father; CVA in her father and paternal grandmother; Colon cancer in her paternal grandfather; Diabetes Mellitus I in her mother; Heart attack in her father and maternal grandfather; Hyperlipidemia in her sister; Hypertension in her mother.    ROS:  Please see the history of present illness.   Otherwise, review of systems are positive for none.   All other systems are reviewed and negative.    PHYSICAL EXAM: VS:  BP 158/88 mmHg  Pulse 90  Ht 5\' 3"  (1.6 m)  Wt  122 lb (55.339 kg)  BMI 21.62 kg/m2  SpO2 97% , BMI Body mass index is 21.62 kg/(m^2). GEN: Well nourished, well developed, in no acute distress HEENT: normal Neck: no JVD or masses.  Left carotid artery bruit Cardiac: RRR; no rubs, or gallops,no edema.  1/6 SM at RUSB to LLSB Respiratory:  clear to auscultation bilaterally, normal work of breathing GI: soft, nontender, nondistended, + BS MS: no deformity or atrophy Skin: warm and dry, no rash Neuro:  Strength and sensation are intact Psych: euthymic mood, full affect   EKG:  EKG is not ordered today.    Recent Labs: No results found for requested labs within last 365 days.    Lipid Panel No results found for: CHOL, TRIG, HDL, CHOLHDL, VLDL, LDLCALC, LDLDIRECT    Wt Readings from Last 3 Encounters:  01/12/15 122 lb (55.339 kg)       ASSESSMENT AND PLAN:  1.  Palpitations that she describes as  her heart not being able to keep up with her activities.  I will get a 30 day heart monitor to assess for arrhythmias 2.  DOE that is new for her.  It has gotten to the point that walking up any incline causes SOB.  She has a family history of CAD with her Dad having an MI at age 40.  Her mom has afib.  She has a history of dyslipidemia. She has never smoked.  Her EKG done at her PCP office is normal.  I will get a stress myoview to rule out ischemia and 2D echo to assess LVF and look for diastolic dysfunction. 3.  Exertional fagitue with Family history of CAD 4.  GERD 5.  Left carotid artery bruit - I will check carotid arterial dopplers 6.  Heart murmur - check 2D echo   Current medicines are reviewed at length with the patient today.  The patient does not have concerns regarding medicines.  The following changes have been made:  no change  Labs/ tests ordered today: See above Assessment and Plan No orders of the defined types were placed in this encounter.     Disposition:   FU with me in PRN pending results of studies  Signed, [name], MD  01/12/2015 1:35 PM    [facility] [place], [place], [place]  [address] Phone: [phone]; Fax: [phone]

## 2015-02-07 NOTE — Progress Notes (Signed)
Stent card reviewed and given to husband [name].

## 2015-02-07 NOTE — Research (Signed)
CAD LAD Informed Consent   Subject Name: Judy Cummings  Subject met inclusion and exclusion criteria.  The informed consent form, study requirements and expectations were reviewed with the subject and questions and concerns were addressed prior to the signing of the consent form.  The subject verbalized understanding of the trail requirements.  The subject agreed to participate in the CAD LAD trial and signed the informed consent.  The informed consent was obtained prior to performance of any protocol-specific procedures for the subject.  A copy of the signed informed consent was given to the subject and a copy was placed in the subject's medical record.  Jakyron Fabro 02/07/2015, 07:20 AM

## 2015-02-07 NOTE — Interval H&P Note (Signed)
History and Physical Interval Note:  02/07/2015 7:26 AM  Judy Cummings  has presented today for cardiac cath with the diagnosis of unstable angina, abnornal stress myoview  The various methods of treatment have been discussed with the patient and family. After consideration of risks, benefits and other options for treatment, the patient has consented to  Procedure(s): Left Heart Cath and Coronary Angiography (N/A) as a surgical intervention .  The patient's history has been reviewed, patient examined, no change in status, stable for surgery.  I have reviewed the patient's chart and labs.  Questions were answered to the patient's satisfaction.    Cath Lab Visit (complete for each Cath Lab visit)  Clinical Evaluation Leading to the Procedure:   ACS: No.  Non-ACS:    Anginal Classification: CCS II  Anti-ischemic medical therapy: No Therapy  Non-Invasive Test Results: Intermediate-risk stress test findings: cardiac mortality 1-3%/year  Prior CABG: No previous CABG         [name]

## 2015-02-08 DIAGNOSIS — K219 Gastro-esophageal reflux disease without esophagitis: Secondary | ICD-10-CM | POA: Diagnosis not present

## 2015-02-08 DIAGNOSIS — I10 Essential (primary) hypertension: Secondary | ICD-10-CM | POA: Diagnosis not present

## 2015-02-08 DIAGNOSIS — I200 Unstable angina: Secondary | ICD-10-CM | POA: Diagnosis not present

## 2015-02-08 DIAGNOSIS — E780 Pure hypercholesterolemia: Secondary | ICD-10-CM | POA: Diagnosis not present

## 2015-02-08 DIAGNOSIS — R0609 Other forms of dyspnea: Secondary | ICD-10-CM

## 2015-02-08 DIAGNOSIS — I25110 Atherosclerotic heart disease of native coronary artery with unstable angina pectoris: Secondary | ICD-10-CM | POA: Diagnosis not present

## 2015-02-08 LAB — BASIC METABOLIC PANEL
Anion gap: 5 (ref 5–15)
CHLORIDE: 101 mmol/L (ref 101–111)
CO2: 29 mmol/L (ref 22–32)
Calcium: 8.5 mg/dL — ABNORMAL LOW (ref 8.9–10.3)
Creatinine, Ser: 0.56 mg/dL (ref 0.44–1.00)
GFR calc Af Amer: >60 mL/min (ref >60)
GFR calc non Af Amer: >60 mL/min (ref >60)
GLUCOSE: 105 mg/dL — AB (ref 65–99)
POTASSIUM: 4.1 mmol/L (ref 3.5–5.1)
Sodium: 135 mmol/L (ref 135–145)

## 2015-02-08 LAB — CBC
HCT: 34 % — ABNORMAL LOW (ref 36.0–46.0)
Hemoglobin: 11.5 g/dL — ABNORMAL LOW (ref 12.0–15.0)
MCH: 31.6 pg (ref 26.0–34.0)
MCHC: 33.8 g/dL (ref 30.0–36.0)
MCV: 93.4 fL (ref 78.0–100.0)
Platelets: 180 10*3/uL (ref 150–400)
RBC: 3.64 MIL/uL — AB (ref 3.87–5.11)
RDW: 14.5 % (ref 11.5–15.5)
WBC: 8.5 10*3/uL (ref 4.0–10.5)

## 2015-02-08 MED ORDER — TICAGRELOR 90 MG PO TABS
180.0000 mg | ORAL_TABLET | Freq: Once | ORAL | Status: AC
  Administered 2015-02-08: 09:00:00 180 mg via ORAL
  Filled 2015-02-08: qty 2

## 2015-02-08 MED ORDER — LISINOPRIL 5 MG PO TABS
5.0000 mg | ORAL_TABLET | Freq: Every day | ORAL | Status: DC
  Administered 2015-02-08: 15:00:00 5 mg via ORAL
  Filled 2015-02-08: qty 1

## 2015-02-08 MED ORDER — ATORVASTATIN CALCIUM 80 MG PO TABS: 80.0000 mg | ORAL_TABLET | Freq: Every day | ORAL | Status: DC

## 2015-02-08 MED ORDER — METOPROLOL TARTRATE 12.5 MG HALF TABLET
12.5000 mg | ORAL_TABLET | Freq: Two times a day (BID) | ORAL | Status: DC
  Administered 2015-02-08: 09:00:00 12.5 mg via ORAL
  Filled 2015-02-08: qty 1

## 2015-02-08 MED ORDER — CLOPIDOGREL BISULFATE 75 MG PO TABS: 75.0000 mg | ORAL_TABLET | Freq: Every day | ORAL | Status: DC

## 2015-02-08 MED ORDER — LISINOPRIL 5 MG PO TABS: 5.0000 mg | ORAL_TABLET | Freq: Every day | ORAL | Status: DC

## 2015-02-08 MED ORDER — TICAGRELOR 90 MG PO TABS: 90.0000 mg | ORAL_TABLET | Freq: Two times a day (BID) | ORAL | Status: DC

## 2015-02-08 MED ORDER — ALUM & MAG HYDROXIDE-SIMETH 200-200-20 MG/5ML PO SUSP
30.0000 mL | ORAL | Status: DC | PRN
  Administered 2015-02-08: 10:00:00 30 mL via ORAL

## 2015-02-08 MED ORDER — METOPROLOL TARTRATE 12.5 MG HALF TABLET: 12.5000 mg | ORAL_TABLET | Freq: Two times a day (BID) | ORAL | Status: DC

## 2015-02-08 MED ORDER — METOPROLOL TARTRATE 25 MG PO TABS: 12.5000 mg | ORAL_TABLET | Freq: Two times a day (BID) | ORAL | Status: AC

## 2015-02-08 MED FILL — Heparin Sodium (Porcine) 2 Unit/ML in Sodium Chloride 0.9%: INTRAMUSCULAR | Qty: 500 | Status: AC

## 2015-02-08 MED FILL — Lidocaine HCl Local Preservative Free (PF) Inj 1%: INTRAMUSCULAR | Qty: 30 | Status: AC

## 2015-02-08 NOTE — Progress Notes (Signed)
Subjective: Right wrist sore.  Objective: Vital signs in last 24 hours: Temp:  [97.7 F (36.5 C)-98.7 F (37.1 C)] 98.6 F (37 C) (06/28 0526) Pulse Rate:  [0-109] 80 (06/28 0526) Resp:  [0-69] 20 (06/28 0526) BP: (140-199)/(68-101) 165/76 mmHg (06/28 0625) SpO2:  [0 %-100 %] 98 % (06/28 0526) Weight:  [117 lb 1 oz (53.1 kg)-120 lb (54.432 kg)] 117 lb 1 oz (53.1 kg) (06/28 0007) Last BM Date: 02/07/15  Intake/Output from previous day: 06/27 0701 - 06/28 0700 In: 930 [P.O.:480; I.V.:450] Out: 2700 [Urine:2700] Intake/Output this shift: Total I/O In: -  Out: 1750 [Urine:1750]  Medications Scheduled Meds: . aspirin EC  81 mg Oral Daily  . atorvastatin  80 mg Oral q1800  . carbamazepine  200 mg Oral Daily  . carbamazepine  400 mg Oral QHS  . clopidogrel  75 mg Oral Q breakfast  . fluticasone  1 spray Each Nare Daily  . metoprolol tartrate  12.5 mg Oral BID  . sodium chloride  3 mL Intravenous Q12H   Continuous Infusions:  PRN Meds:.sodium chloride, acetaminophen, alum & mag hydroxide-simeth, ondansetron (ZOFRAN) IV, sodium chloride  PE: General appearance: alert, cooperative and no distress Lungs: clear to auscultation bilaterally Heart: regular rate and rhythm and 1/6 sys MM RSB Extremities: No LEE Pulses: 2+ and symmetric Skin: Warm and dry.  Large area of ecchymosis at the right wrist cath site.  Sore Neurologic: Grossly normal  Lab Results:   Recent Labs  02/08/15 0510  WBC 8.5  HGB 11.5*  HCT 34.0*  PLT 180   BMET  Recent Labs  02/08/15 0510  NA 135  K 4.1  CL 101  CO2 29  GLUCOSE 105*  BUN <5*  CREATININE 0.56  CALCIUM 8.5*    Ost RPDA lesion, 95% stenosed.  Prox RCA lesion, 40% stenosed.  Mid Cx lesion, 20% stenosed.  Prox LAD to Mid LAD lesion, 40% stenosed.  The left ventricular systolic function is normal.  Dist LAD lesion, 99% stenosed. There is a 0% residual stenosis post intervention. The lesion was previously treated  with a stent (unknown type) .  A drug-eluting stent was placed.  Mid LAD to Dist LAD lesion, 99% stenosed. There is a 0% residual stenosis post intervention.  A drug-eluting stent was placed.  Mid RCA-1 lesion, 40% stenosed.  Mid RCA-2 lesion, 99% stenosed. There is a 0% residual stenosis post intervention.  A drug-eluting stent was placed.   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  She underwent successful PTCA/DES x 2 mid LAD.  ASA, plavix, statin .  Add lopressor 12.5 bid.  Titrate up.  Cardiac rehab this morning and Phase II.  SCr WNL.   HAGER, BRYAN PA-C 02/08/2015 6:46 AM  i added lisinopril 5mg  also.  BP still high  HAGER, BRYAN, PAC 10:00 AM  I have examined the patient and reviewed assessment and plan and discussed with patient.  Agree with above as stated.  Mild bruise at right wrist.  Pulse present in the right wrist. Doing well post cath.  Plan dsicharge after cardiac rehab eval.  SHe is interested in the education from cardiac rehab and also in dietary changes.   I personally reviewed tele.  Adding ACE-I for HTN.  Maurina Fawaz  S.

## 2015-02-08 NOTE — Discharge Summary (Signed)
Physician Discharge Summary     Cardiologist:  Turner  Patient ID: Judy Cummings MRN: [id number] DOB/AGE: 08-28-1945 69 y.o.  Admit date: 02/07/2015 Discharge date: 02/08/2015  Admission Diagnoses:  DOE, Unstable angina  Discharge Diagnoses:  Active Problems:   Hypercholesterolemia   DOE (dyspnea on exertion)   Unstable angina   Essential hypertension   Discharged Condition: stable  Hospital Course:   Judy Cummings is a 69 y.o. female who presents for evaluation of exertional fatigue. She also has felt like her heart is not beating fast enough to keep up with her and she felt SOB. These symptoms typically occur after she has been working out in the yard for a few days, keeping grandkids and running errand. The next day she will feel exhausted and felt that her heart was not beating fast enough. She denies any chest pain or pressure. She has noticed some exertional SOB when walking up a hill but is able to ride a bike on flat ground without any problems. She has felt dizzy sometimes when out doing yard work even walking across the yard. She denies any LE edema, PND, orthopnea or syncope. She had a stomach flu in April and since then has had a lot of reflux symptoms with indigestion and gagging.  She underwent a myoview stress test which was interpreted as moderate risk with a small area of moderate inferior wall ischemia from apex to base.  The patient was then scheduled for and underwent a left heart cath which revealed severe double vessel CAD with severe stenosis mid LAD and severe stenosis mid RCA, normal LV systolic function. She underwent successful PTCA/DES x 2 mid LAD. ASA, plavix, statin were started.  The patient was apparently enrolled in the TWILIGHT trial and plavix was discontinued by the study RN.  We added lopressor 12.5 bid and lisinopril 5mg  for HTN.  Follow in the office. Cardiac rehab with recommendation for Phase II. SCr WNL post cath.  The patient was seen by Dr.  Irish Lack who felt she was stable for DC home.   Consults: Cardiac rehab  Significant Diagnostic Studies:   Left Heart cath  PCI Note:   Lesion #1 mid LAD: XB LAD 3.5 guiding catheter. Additional 7000 units IV heparin. ACT over 200. Cougar IC wire down LAD. 2.0 x 20 mm balloon x 1 mid LAD x 1. 2.25 x 32 Promus Premier deployed mid LAD. The stent was post-dilated with a 2.25 x 20 mm [place] balloon x 2. I was unable to deliver a stent to the distal segment. I initially used another Cougar wire as a buddy wire but could not deliver the stent. I then put down a Mailman wire into the distal LAD. A 2.0 x 20 mm balloon was used to pre-dilate the distal segment. I then delivered and deployed a 2.25 x 12 mm Promus Premier DES in the distal LAD overlapping the first stent. The stents were post-dilated with the 2.25 x 20 mm [place] balloon x 2. The stenosis was taken from 99% in three locations down to 0% in all locations. TIMI-3 flow pre and post stent.   Lesion #2 mid RCA: AR1 guiding catheter. Cougar IC wire down the RCA. 2.0 x 12 mm balloon x 1 in mid RCA. A 2.25 x 12 mm Promus Premier DES was deployed in the mid RCA. The stent was post-dilated with a 2.25 x 8 mm [place] balloon x 1. The stenosis was taken from 99% down to 0%. TIMI-3 flow pre  and post stent.   The sheath was removed from the right radial artery and a Terumo hemostasis band was applied at the arteriotomy site on the right wrist.    Conclusion     Ost RPDA lesion, 95% stenosed.  Prox RCA lesion, 40% stenosed.  Mid Cx lesion, 20% stenosed.  Prox LAD to Mid LAD lesion, 40% stenosed.  The left ventricular systolic function is normal.  Dist LAD lesion, 99% stenosed. There is a 0% residual stenosis post intervention. The lesion was previously treated with a stent (unknown type) .  A drug-eluting stent was placed.  Mid LAD to Dist LAD lesion, 99% stenosed. There is a 0% residual stenosis post intervention.  A drug-eluting stent was  placed.  Mid RCA-1 lesion, 40% stenosed.  Mid RCA-2 lesion, 99% stenosed. There is a 0% residual stenosis post intervention.  A drug-eluting stent was placed.  1. Severe double vessel CAD with severe stenosis mid LAD and severe stenosis mid RCA 2. Normal LV systolic function 3. Unstable angina 4. Successful PTCA/DES x 2 mid LAD 5. Successful PTCA/DES x 1 mid RCA  Recommendations: Will continue ASA and Plavix for at least one year. Will start statin.       Treatments:  See above  Discharge Exam: Blood pressure 158/72, pulse 71, temperature 98 F (36.7 C), temperature source Oral, resp. rate 20, height 5\' 3"  (1.6 m), weight 117 lb 1 oz (53.1 kg), SpO2 98 %.  Disposition: Final discharge disposition not confirmed      Discharge Instructions    Amb Referral to Cardiac Rehabilitation    Complete by:  As directed   Congestive Heart Failure: If diagnosis is Heart Failure, patient MUST meet each of the CMS criteria: 1. Left Ventricular Ejection Fraction </= 35% 2. NYHA class II-IV symptoms despite being on optimal heart failure therapy for at least 6 weeks. 3. Stable = have not had a recent (<6 weeks) or planned (<6 months) major cardiovascular hospitalization or procedure  Program Details: - Physician supervised classes - 1-3 classes per week over a 12-18 week period, generally for a total of 36 sessions  Physician Certification: I certify that the above Cardiac Rehabilitation treatment is medically necessary and is medically approved by me for treatment of this patient. The patient is willing and cooperative, able to ambulate and medically stable to participate in exercise rehabilitation. The participant's progress and Individualized Treatment Plan will be reviewed by the Medical Director, Cardiac Rehab staff and as indicated by the Referring/Ordering Physician.  Diagnosis:  PCI     Diet - low sodium heart healthy    Complete by:  As directed      Discharge instructions     Complete by:  As directed   No lifting with right arm for 3-4 days.     Increase activity slowly    Complete by:  As directed             Medication List    TAKE these medications        acetaminophen 325 MG tablet  Commonly known as:  TYLENOL  Take 650 mg by mouth every 6 (six) hours as needed for mild pain.     aspirin EC 81 MG tablet  Take 1 tablet (81 mg total) by mouth daily.     atorvastatin 80 MG tablet  Commonly known as:  LIPITOR  Take 1 tablet (80 mg total) by mouth daily at 6 PM.     Azelaic Acid 15 % cream  Apply 1 application topically daily. 1 application to affected area     fluticasone 50 MCG/ACT nasal spray  Commonly known as:  FLONASE  Place 1 spray into both nostrils daily. 1 spray in each nostril     lisinopril 5 MG tablet  Commonly known as:  PRINIVIL,ZESTRIL  Take 1 tablet (5 mg total) by mouth daily.     metoprolol tartrate 25 MG tablet  Commonly known as:  LOPRESSOR  Take 0.5 tablets (12.5 mg total) by mouth 2 (two) times daily.     carbamazepine 400 MG 12 hr tablet  Commonly known as:  TEGRETOL XR  Take 400 mg by mouth at bedtime. Take along with the 200mg  XR tablet per patient     TEGRETOL-XR 200 MG 12 hr tablet  Generic drug:  carbamazepine  Take 200 mg by mouth daily. Take along with the 400mg  XR tablet. Per patient     zaleplon 5 MG capsule  Commonly known as:  SONATA  Take 5 mg by mouth at bedtime as needed for sleep.       Follow-up Information    Follow up with [name], PA-C On 02/16/2015.   Specialties:  Cardiology, Radiology   Why:  3:00 PM   Contact information:   [address] [place] [address] [phone]      Greater than 30 minutes was spent completing the patient's discharge.    SignedTarri Fuller, [place] 02/08/2015, 12:24 PM  I have examined the patient and reviewed assessment and plan and discussed with patient.  Agree with above as stated.  Mild bruise at right wrist.  Pulse present in  the right wrist. Doing well post cath.  Plan dsicharge after cardiac rehab eval.  SHe is interested in the education from cardiac rehab and also in dietary changes.   I personally reviewed tele.  Adding ACE-I for HTN.  VARANASI,JAYADEEP S.

## 2015-02-08 NOTE — Research (Signed)
TWILIGHT Informed Consent   Subject Name: Judy Cummings  Subject met inclusion and exclusion criteria.  The informed consent form, study requirements and expectations were reviewed with the subject and questions and concerns were addressed prior to the signing of the consent form.  The subject verbalized understanding of the trail requirements.  The subject agreed to participate in the TWILIGHT trial and signed the informed consent.  The informed consent was obtained prior to performance of any protocol-specific procedures for the subject.  A copy of the signed informed consent was given to the subject and a copy was placed in the subject's medical record.  Hedrick,Trammell Bowden W 02/08/2015, 2998

## 2015-02-08 NOTE — Progress Notes (Signed)
CARDIAC REHAB PHASE I   PRE:  Rate/Rhythm: 86 SR    BP: sitting 146/72    SaO2:   MODE:  Ambulation: 800 ft   POST:  Rate/Rhythm: 102 ST    BP: sitting 160/84     SaO2:   Pt tolerated fairly well. Felt tired toward end. To recliner. Began ed but toward end of education pt felt too tired to sit in recliner, felt she needed to lie down. Also felt hot. HR and BP stable, somewhat elevated after walking. Ed completed with good reception. Pt interested in CRPII and requests her name be sent to [place].  [place], [place], ACSM 02/08/2015 8:55 AM

## 2015-02-09 ENCOUNTER — Other Ambulatory Visit: Payer: Self-pay | Admitting: *Deleted

## 2015-02-09 ENCOUNTER — Telehealth: Payer: Self-pay | Admitting: Cardiology

## 2015-02-09 MED ORDER — AMBULATORY NON FORMULARY MEDICATION: 81.0000 mg | Freq: Every day | Status: DC

## 2015-02-09 MED ORDER — AMBULATORY NON FORMULARY MEDICATION: 90.0000 mg | Freq: Two times a day (BID) | Status: AC

## 2015-02-09 NOTE — Telephone Encounter (Signed)
Follow up     pt is calling in to speak to Dr.Turner  She states on 6-28 the pt was in the office and the RN took the IV out and the bleeding and the nurse tore her skin  About office visit please return call.

## 2015-02-09 NOTE — Telephone Encounter (Signed)
Judy Cummings is calling because she had stents placed on Monday Dr. Angelena Form and when they took the IV out  The tape took some of her skin off and she cannot get it to stop bleeding . Please call    Thanks

## 2015-02-09 NOTE — Telephone Encounter (Signed)
**Note De-Identified Denna Fryberger Obfuscation** The pt is advised and she verbalized understanding.  Correction to last entry: The pt was not seen in this office yesterday. Her complant is that after her cath on 6/27 the nurse at the hospital accidentally tore the skin on her left [facility] when removing IV tape.  Will forward message to [name] as Juluis Rainier.

## 2015-02-09 NOTE — Telephone Encounter (Addendum)
LMTCB.  Per Kerin Ransom, PA-c (Flex) the pt should lightly wrap wound with a non stick dressing.

## 2015-02-10 ENCOUNTER — Other Ambulatory Visit: Payer: Self-pay | Admitting: Family Medicine

## 2015-02-10 ENCOUNTER — Telehealth: Payer: Self-pay | Admitting: Physician Assistant

## 2015-02-10 DIAGNOSIS — R9389 Abnormal findings on diagnostic imaging of other specified body structures: Secondary | ICD-10-CM

## 2015-02-10 NOTE — Telephone Encounter (Signed)
Per Dr. Radford Pax, called patient to ask her if her arm is better.  Left message to call back.

## 2015-02-10 NOTE — Telephone Encounter (Signed)
Patient st her arm is not bleeding anymore, but there is a skin tear. She has covered it with gauze.  She c/o mild intermittent back pain from her shoulder blades to her waist that is improving. Informed patient that her back pain could be from lying on the cath lab table for so long.  Informed patient that if her back pain persists, to contact her PCP. Patient has FU appointment with Kerin Ransom on 7/6.

## 2015-02-10 NOTE — Telephone Encounter (Signed)
Contacted the after hour cardiology service for advise on abdominal U/S and recent stents. I have told her stents will not interfere with any U/S procedure. I have advised her to continue on ASA and brilinta this morning after U/S.  Hilbert Corrigan PA Pager: [phone]

## 2015-02-15 ENCOUNTER — Ambulatory Visit
Admission: RE | Admit: 2015-02-15 | Discharge: 2015-02-15 | Disposition: A | Discharge status: Home / Self Care | Payer: Medicare PPO | Source: Ambulatory Visit | Attending: Family Medicine | Admitting: Family Medicine

## 2015-02-15 ENCOUNTER — Telehealth: Payer: Self-pay | Admitting: Cardiology

## 2015-02-15 DIAGNOSIS — R9389 Abnormal findings on diagnostic imaging of other specified body structures: Secondary | ICD-10-CM

## 2015-02-15 MED ORDER — IOPAMIDOL (ISOVUE-370) INJECTION 76%
100.0000 mL | Freq: Once | INTRAVENOUS | Status: AC | PRN
  Administered 2015-02-15: 100 mL via INTRAVENOUS

## 2015-02-15 NOTE — Telephone Encounter (Signed)
Called patient back at 940am.  Left VM to return call

## 2015-02-15 NOTE — Telephone Encounter (Signed)
New message      Pt had stints put in on June 27th.  Pt having pain in back, pain around left breast.  Pt also having problem with fatigue. Please advise

## 2015-02-16 ENCOUNTER — Other Ambulatory Visit: Payer: Self-pay | Admitting: Rheumatology

## 2015-02-16 ENCOUNTER — Ambulatory Visit (INDEPENDENT_AMBULATORY_CARE_PROVIDER_SITE_OTHER): Payer: Medicare PPO | Admitting: Cardiology

## 2015-02-16 ENCOUNTER — Encounter: Payer: Self-pay | Admitting: Cardiology

## 2015-02-16 VITALS — BP 132/66 | HR 71 | Ht 63.0 in | Wt 116.6 lb

## 2015-02-16 DIAGNOSIS — I739 Peripheral vascular disease, unspecified: Secondary | ICD-10-CM

## 2015-02-16 DIAGNOSIS — Z9861 Coronary angioplasty status: Secondary | ICD-10-CM

## 2015-02-16 DIAGNOSIS — R569 Unspecified convulsions: Secondary | ICD-10-CM

## 2015-02-16 DIAGNOSIS — R0609 Other forms of dyspnea: Secondary | ICD-10-CM

## 2015-02-16 DIAGNOSIS — E7800 Pure hypercholesterolemia, unspecified: Secondary | ICD-10-CM

## 2015-02-16 DIAGNOSIS — I779 Disorder of arteries and arterioles, unspecified: Secondary | ICD-10-CM

## 2015-02-16 DIAGNOSIS — I2510 Atherosclerotic heart disease of native coronary artery without angina pectoris: Secondary | ICD-10-CM | POA: Insufficient documentation

## 2015-02-16 DIAGNOSIS — T733XXA Exhaustion due to excessive exertion, initial encounter: Secondary | ICD-10-CM

## 2015-02-16 DIAGNOSIS — I10 Essential (primary) hypertension: Secondary | ICD-10-CM

## 2015-02-16 NOTE — Patient Instructions (Signed)
Your physician recommends that you schedule a follow-up appointment in: 3 Months with [name]  Your physician recommends that you return for lab work in: 6 Weeks CMP and Fasting Lipids

## 2015-02-16 NOTE — Assessment & Plan Note (Signed)
Moderate, asymptomatic

## 2015-02-16 NOTE — Progress Notes (Signed)
02/16/2015 Judy Cummings   Feb 15, 1946  [id number]  Primary Physician Shirline Frees, MD Primary Cardiologist: [name]  HPI:  69 y/o female referred to [name] with complaints of fatigue, DOE, and palpitations. The pt had risk factors for CAD and had PVD (moderate bilateral ICA disease). Work up done included and echo ( essentially normal) and a Myoview 02/02/15 that was positive for ischemia. She underwent cath and was found to have severe LAD and RCA disease. She underwent DES placement and was enrolled in the TWILIGHT study. She is in the office today for follow up. She has had some "sharp" shooting chest pain. She notes DOE, worse in the am. She has had some anorexia and GI issues but this started before her stents. She has an appointment with Dr Oletta Lamas about his.    Current Outpatient Prescriptions  Medication Sig Dispense Refill  . acetaminophen (TYLENOL) 325 MG tablet Take 650 mg by mouth every 6 (six) hours as needed for mild pain.    [name] AMBULATORY NON FORMULARY MEDICATION Take 90 mg by mouth 2 (two) times daily. Medication Name: Brilinta 90 mg BID provided by TWILIGHT Research study (Do Not Fill)    . AMBULATORY NON FORMULARY MEDICATION Take 81 mg by mouth daily. Medication Name: ASPIRIN 81 mg daily provided by [facility] Research study    . atorvastatin (LIPITOR) 80 MG tablet Take 1 tablet (80 mg total) by mouth daily at 6 PM. 30 tablet 11  . Azelaic Acid 15 % cream Apply 1 application topically daily. 1 application to affected area    . carbamazepine (TEGRETOL XR) 400 MG 12 hr tablet Take 400 mg by mouth at bedtime. Take along with the 200mg  XR tablet per patient    . fluticasone (FLONASE) 50 MCG/ACT nasal spray Place 1 spray into both nostrils daily. 1 spray in each nostril    . lisinopril (PRINIVIL,ZESTRIL) 5 MG tablet Take 1 tablet (5 mg total) by mouth daily. 30 tablet 11  . metoprolol tartrate (LOPRESSOR) 25 MG tablet Take 0.5 tablets (12.5 mg total) by mouth 2 (two) times  daily. 60 tablet 4  . Multiple Vitamins-Minerals (MULTIVITAMIN & MINERAL PO) Take 1 tablet by mouth daily.    . TEGRETOL-XR 200 MG 12 hr tablet Take 200 mg by mouth daily. Take along with the 400mg  XR tablet. Per patient    . zaleplon (SONATA) 5 MG capsule Take 5 mg by mouth at bedtime as needed for sleep.     No current facility-administered medications for this visit.    Allergies  Allergen Reactions  . Boniva [Ibandronic Acid]     DIARRHEA   . Erythromycin     INTERACTION WITH TEGRETOL AND SIDE EFFECTS   . Fosamax [Alendronate Sodium]     UNKNOWN  . Glucosamine Forte [Nutritional Supplements]     VARIOCELES IN MOUTH AND SIDE EFFECTS   . Guaifenesin & Derivatives     SLEEPY AND SIDE EFFECTS  . Nexium [Esomeprazole Magnesium]     DIZZINESS  . Zostavax [Zoster Vaccine Live]     DIZZINESS, HEADACHES, DYSPHAGIA    History   Social History  . Marital Status: Married    Spouse Name: gilbert  . Number of Children: 2  . Years of Education: college   Occupational History  . retired    Social History Main Topics  . Smoking status: Never Smoker   . Smokeless tobacco: Never Used  . Alcohol Use: No  . Drug Use: No  .  Sexual Activity: Not on file   Other Topics Concern  . Not on file   Social History Narrative     Review of Systems: General: negative for chills, fever, night sweats or weight changes.  Cardiovascular: negative for chest pain, dyspnea on exertion, edema, orthopnea, palpitations, paroxysmal nocturnal dyspnea or shortness of breath Dermatological: negative for rash Respiratory: negative for cough or wheezing Urologic: negative for hematuria Abdominal: negative for nausea, vomiting, diarrhea, bright red blood per rectum, melena, or hematemesis Neurologic: negative for visual changes, syncope, or dizziness All other systems reviewed and are otherwise negative except as noted above.    Blood pressure 132/66, pulse 71, height 5\' 3"  (1.6 m), weight 116 lb  9.6 oz (52.889 kg).  General appearance: alert, cooperative and no distress Neck: no JVD and soft LCA bruit Lungs: clear to auscultation bilaterally Heart: regular rate and rhythm Abdomen: soft not distended Extremities: ecchymosis LUE Pulses: 2+ and symmetric Skin: Skin color, texture, turgor normal. No rashes or lesions Neurologic: Grossly normal  EKG NSR  ASSESSMENT AND PLAN:   Dyslipidemia Statin is new June 2016  Seizures On Tegretol, she has not had siezures in years  Carotid disease, bilateral Moderate, asymptomatic  Essential hypertension Controlled  Fatigue due to excessive exertion Anginal equivalent leading to her work up May and early June 2016  DOE (dyspnea on exertion) I suspect this is from [facility]. I reviewed her medications in detail with her. She should have lipids and a CMET in 6 weeks. I did not suggest a PPI as she had had problems with these in the past, and she will see Dr Oletta Lamas in a couple of weeks. I did tell her she could take her Brilinta with a little caffeine. She has a f/u with the research RN and will mention this to them as well. F/U [name] in 3 months.   Kerin Ransom K PA-C 02/16/2015 4:11 PM

## 2015-02-16 NOTE — Assessment & Plan Note (Signed)
Controlled.  

## 2015-02-16 NOTE — Assessment & Plan Note (Signed)
Statin is new June 2016

## 2015-02-16 NOTE — Telephone Encounter (Signed)
Patient complains she just doesn't have any energy.  Gets up and walks but is having intermittent pain from shoulders to mid-back. Better than it was but still there.  C/o always being humgry.  Up at 0300 eating, Will be seeing Kerin Ransom PA for appointment this afternoon.at 3pm

## 2015-02-16 NOTE — Assessment & Plan Note (Addendum)
Anginal equivalent leading to her work up May and early June 2016

## 2015-02-16 NOTE — Assessment & Plan Note (Signed)
I suspect this is from [employer]

## 2015-02-16 NOTE — Assessment & Plan Note (Signed)
On Tegretol, she has not had siezures in years

## 2015-02-17 ENCOUNTER — Other Ambulatory Visit (HOSPITAL_COMMUNITY): Payer: Self-pay | Admitting: Family Medicine

## 2015-02-17 ENCOUNTER — Telehealth: Payer: Self-pay | Admitting: Cardiology

## 2015-02-17 DIAGNOSIS — K769 Liver disease, unspecified: Secondary | ICD-10-CM

## 2015-02-17 NOTE — Telephone Encounter (Signed)
Pt is scheduled for a liver biopsy 02/21/15.  After discussing patients meds she is taking brilinta and aspirin. Pt was told she can not stop brilinta for up to 1 year due to new cardiac stents placed last month. She was told to call ordering physician for the liver biopsy to make them aware.  Pt was told to call with further questions, she voiced understanding.

## 2015-02-17 NOTE — Telephone Encounter (Signed)
New Message       Pt calling stating that she is scheduled to have a Biopsy on Monday at 10 am and was told to take her heart meds but not aspirin. Pt is wanting to clarify with Dr. Radford Pax which meds she should and should not take. Please call back and advise.

## 2015-02-18 ENCOUNTER — Other Ambulatory Visit: Payer: Self-pay | Admitting: Family Medicine

## 2015-02-18 ENCOUNTER — Other Ambulatory Visit: Payer: Self-pay | Admitting: Radiology

## 2015-02-18 ENCOUNTER — Ambulatory Visit
Admission: RE | Admit: 2015-02-18 | Discharge: 2015-02-18 | Disposition: A | Discharge status: Home / Self Care | Payer: Medicare PPO | Source: Ambulatory Visit | Attending: Family Medicine | Admitting: Family Medicine

## 2015-02-18 ENCOUNTER — Telehealth: Payer: Self-pay | Admitting: Hematology

## 2015-02-18 DIAGNOSIS — K769 Liver disease, unspecified: Secondary | ICD-10-CM

## 2015-02-18 NOTE — Telephone Encounter (Signed)
new patient appt-s/w patient and gave np appt for 07/11 @ 1:30 w/Dr. Irene Limbo. Referring Dr. Shirline Frees Dx- Liver Mets   Referral information scanned

## 2015-02-21 ENCOUNTER — Ambulatory Visit: Payer: Medicare PPO

## 2015-02-21 ENCOUNTER — Ambulatory Visit (HOSPITAL_COMMUNITY): Payer: Medicare PPO

## 2015-02-21 ENCOUNTER — Ambulatory Visit (HOSPITAL_BASED_OUTPATIENT_CLINIC_OR_DEPARTMENT_OTHER): Payer: Medicare PPO | Admitting: Hematology

## 2015-02-21 ENCOUNTER — Telehealth: Payer: Self-pay | Admitting: Hematology

## 2015-02-21 ENCOUNTER — Encounter: Payer: Self-pay | Admitting: Hematology

## 2015-02-21 ENCOUNTER — Ambulatory Visit (HOSPITAL_BASED_OUTPATIENT_CLINIC_OR_DEPARTMENT_OTHER): Payer: Medicare PPO

## 2015-02-21 ENCOUNTER — Other Ambulatory Visit (HOSPITAL_BASED_OUTPATIENT_CLINIC_OR_DEPARTMENT_OTHER): Payer: Self-pay | Admitting: *Deleted

## 2015-02-21 VITALS — BP 159/78 | HR 76 | Temp 98.4°F | Resp 16 | Ht 63.0 in | Wt 117.7 lb

## 2015-02-21 DIAGNOSIS — I6523 Occlusion and stenosis of bilateral carotid arteries: Secondary | ICD-10-CM

## 2015-02-21 DIAGNOSIS — K30 Functional dyspepsia: Secondary | ICD-10-CM

## 2015-02-21 DIAGNOSIS — R52 Pain, unspecified: Secondary | ICD-10-CM

## 2015-02-21 DIAGNOSIS — E46 Unspecified protein-calorie malnutrition: Secondary | ICD-10-CM

## 2015-02-21 DIAGNOSIS — C801 Malignant (primary) neoplasm, unspecified: Secondary | ICD-10-CM

## 2015-02-21 DIAGNOSIS — R110 Nausea: Secondary | ICD-10-CM

## 2015-02-21 DIAGNOSIS — R1013 Epigastric pain: Secondary | ICD-10-CM

## 2015-02-21 DIAGNOSIS — C787 Secondary malignant neoplasm of liver and intrahepatic bile duct: Secondary | ICD-10-CM

## 2015-02-21 DIAGNOSIS — R978 Other abnormal tumor markers: Secondary | ICD-10-CM

## 2015-02-21 LAB — CBC WITH DIFFERENTIAL/PLATELET
BASO%: 0.7 % (ref 0.0–2.0)
BASOS ABS: 0.1 10*3/uL (ref 0.0–0.1)
EOS%: 2.8 % (ref 0.0–7.0)
Eosinophils Absolute: 0.3 10*3/uL (ref 0.0–0.5)
HCT: 37 % (ref 34.8–46.6)
HGB: 12.2 g/dL (ref 11.6–15.9)
LYMPH#: 0.9 10*3/uL (ref 0.9–3.3)
LYMPH%: 7.5 % — ABNORMAL LOW (ref 14.0–49.7)
MCH: 31.4 pg (ref 25.1–34.0)
MCHC: 33.1 g/dL (ref 31.5–36.0)
MCV: 94.7 fL (ref 79.5–101.0)
MONO#: 1 10*3/uL — AB (ref 0.1–0.9)
MONO%: 8.9 % (ref 0.0–14.0)
NEUT#: 9.4 10*3/uL — ABNORMAL HIGH (ref 1.5–6.5)
NEUT%: 80.1 % — ABNORMAL HIGH (ref 38.4–76.8)
PLATELETS: 230 10*3/uL (ref 145–400)
RBC: 3.9 10*6/uL (ref 3.70–5.45)
RDW: 15.2 % — ABNORMAL HIGH (ref 11.2–14.5)
WBC: 11.7 10*3/uL — ABNORMAL HIGH (ref 3.9–10.3)

## 2015-02-21 LAB — COMPREHENSIVE METABOLIC PANEL (CC13)
ALBUMIN: 3.7 g/dL (ref 3.5–5.0)
ALK PHOS: 237 U/L — AB (ref 40–150)
ALT: 39 U/L (ref 0–55)
AST: 67 U/L — AB (ref 5–34)
Anion Gap: 8 mEq/L (ref 3–11)
BILIRUBIN TOTAL: 0.48 mg/dL (ref 0.20–1.20)
BUN: 10.7 mg/dL (ref 7.0–26.0)
CO2: 25 mEq/L (ref 22–29)
CREATININE: 0.7 mg/dL (ref 0.6–1.1)
Calcium: 9.7 mg/dL (ref 8.4–10.4)
Chloride: 100 mEq/L (ref 98–109)
EGFR: >90 mL/min/{1.73_m2} (ref >90)
GLUCOSE: 96 mg/dL (ref 70–140)
POTASSIUM: 4.9 meq/L (ref 3.5–5.1)
Sodium: 134 mEq/L — ABNORMAL LOW (ref 136–145)
TOTAL PROTEIN: 7.5 g/dL (ref 6.4–8.3)

## 2015-02-21 NOTE — Patient Instructions (Addendum)
Liver lesions concerning for tumor Plan -Dr. cardiologist about managing your antiplatelet therapy in preparation for a biopsy -We'll get a PET/CT scan to better study to disease process -We shall get labs today to check your blood counts, tumor markers  -referral to have a endoscopy with your gastroenterologist -Return to clinic in 7-10 days. -I shall call with a plan for biopsy

## 2015-02-21 NOTE — Telephone Encounter (Signed)
Gave patient appointment schedule for July. [facility] will call patient with pet scan appointment - patient aware. Spoke with Museum/gallery conservator at Dr. Oletta Lamas' office - [facility] re EGD. Per [employer] patient already has appointment with Dr. Oletta Lamas 02/24/15 @ 9 am - fax notes and they will be given to Dr. Oletta Lamas - EGD will be discussed with patient at visit - patient aware. Copy of referral along with fax coversheet sent to HIM to send notes to [facility] @ [phone] or [phone] - Attn: [facility].

## 2015-02-22 ENCOUNTER — Encounter: Payer: Self-pay | Admitting: Hematology

## 2015-02-22 ENCOUNTER — Telehealth: Payer: Self-pay | Admitting: Cardiology

## 2015-02-22 DIAGNOSIS — R978 Other abnormal tumor markers: Secondary | ICD-10-CM | POA: Insufficient documentation

## 2015-02-22 DIAGNOSIS — C801 Malignant (primary) neoplasm, unspecified: Secondary | ICD-10-CM

## 2015-02-22 DIAGNOSIS — R1013 Epigastric pain: Secondary | ICD-10-CM | POA: Insufficient documentation

## 2015-02-22 DIAGNOSIS — E785 Hyperlipidemia, unspecified: Secondary | ICD-10-CM

## 2015-02-22 DIAGNOSIS — F488 Other specified nonpsychotic mental disorders: Secondary | ICD-10-CM | POA: Insufficient documentation

## 2015-02-22 DIAGNOSIS — E46 Unspecified protein-calorie malnutrition: Secondary | ICD-10-CM | POA: Insufficient documentation

## 2015-02-22 DIAGNOSIS — G40909 Epilepsy, unspecified, not intractable, without status epilepticus: Secondary | ICD-10-CM | POA: Insufficient documentation

## 2015-02-22 DIAGNOSIS — C259 Malignant neoplasm of pancreas, unspecified: Secondary | ICD-10-CM | POA: Insufficient documentation

## 2015-02-22 DIAGNOSIS — R5383 Other fatigue: Secondary | ICD-10-CM | POA: Insufficient documentation

## 2015-02-22 DIAGNOSIS — Z79899 Other long term (current) drug therapy: Secondary | ICD-10-CM

## 2015-02-22 DIAGNOSIS — G479 Sleep disorder, unspecified: Secondary | ICD-10-CM | POA: Insufficient documentation

## 2015-02-22 DIAGNOSIS — C787 Secondary malignant neoplasm of liver and intrahepatic bile duct: Secondary | ICD-10-CM

## 2015-02-22 DIAGNOSIS — R52 Pain, unspecified: Secondary | ICD-10-CM | POA: Insufficient documentation

## 2015-02-22 HISTORY — DX: Secondary malignant neoplasm of liver and intrahepatic bile duct: C80.1

## 2015-02-22 HISTORY — DX: Secondary malignant neoplasm of liver and intrahepatic bile duct: C78.7

## 2015-02-22 LAB — APTT: aPTT: 31 seconds (ref 24–37)

## 2015-02-22 LAB — CANCER ANTIGEN 19-9: CA 19 9: 11838.7 U/mL — AB (ref <35.0)

## 2015-02-22 LAB — PROTHROMBIN TIME
INR: 1.12 (ref <1.50)
Prothrombin Time: 14.4 seconds (ref 11.6–15.2)

## 2015-02-22 NOTE — Addendum Note (Signed)
Addended by: Sullivan Lone on: 02/22/2015 12:10 PM   Modules accepted: Orders

## 2015-02-22 NOTE — Telephone Encounter (Signed)
CMET & Lipid ordered - per last OV note.  Pt instructed on fasting for test and approximate date, when and where to report for lab draw. She verbalized understanding of instructions.

## 2015-02-22 NOTE — Telephone Encounter (Signed)
Pt saw Lurena Joiner last week,he told her to get lab work in 6 weeks. Her question is where is she supposed to have the lab work? Does she take her medicine before her lab work?

## 2015-02-22 NOTE — Addendum Note (Signed)
Addended by: Sullivan Lone on: 02/22/2015 08:37 AM   Modules accepted: Orders

## 2015-02-22 NOTE — Assessment & Plan Note (Signed)
We will refer the patient to nutrition specialist Will add pancreatic enzyme supplements once diagnosis confirmed.

## 2015-02-22 NOTE — Assessment & Plan Note (Signed)
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 Patient has been referred to gastroenterology for an EGD to rule out other etiologies. Order Zofran when necessary if needed for nausea patient currently is not requesting any antinausea medication

## 2015-02-22 NOTE — Assessment & Plan Note (Signed)
Patient's pain is reasonably controlled currently without any overt pain medication requirements. We shall anticipate that pain management might become an increasing issue. Plan -Closely monitor the patient for evolving symptoms and proactively manage her pain.

## 2015-02-22 NOTE — Assessment & Plan Note (Addendum)
Patient is having mild upper abdominal pain worse with palpation. Also having dyspeptic symptoms and anorexia has lost about 15 pounds in the last couple of months. 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  Plan -Getting a tissue diagnosis at this point is somewhat challenging given the patient has recently had drug-eluting stents placed in her LAD and RCA for coronary artery disease and is on dual antiplatelet therapy which will be difficult to interrupt the patient is only about 2 weeks out from stent placement. -I shall discuss with her cardiologist the best approach to managing her antiplatelet therapy to allow for tissue diagnosis. -PET/CT scan accurately evaluate the extent of her disease and determine if there is an alternative biopsy site that might be less likely to bleed or that might be easier to source control if bleeding occurs. -Gastroenterology referral for upper GI endoscopy -Tumor markers CA-19-9, CEA, the chromogranin A levels. -if pancreatic cancer is confirmed somewhat the patient's dyspeptic symptoms might be from exocrine pancreatic insufficiency we will consider adding pancreatic enzyme supplements  Return to clinic with Dr. Sullivan Lone in 7 days with plan to start treatment based on tissue diagnosis.

## 2015-02-22 NOTE — Progress Notes (Signed)
CONSULT NOTE  Patient Care Team: Shirline Frees, MD as PCP - General (Family Medicine)  CHIEF COMPLAINTS/PURPOSE OF CONSULTATION: Abdominal pain with newly noted multiple liver lesions concerning for metastatic malignancy with an unknown primary.  HISTORY OF PRESENTING ILLNESS:  Judy Cummings 69 y.o. female below mentioned past medical history who has been referred for evaluation of imaging evidence of multiple liver nodules concerning for metastatic malignancy. Judy Cummings was apparently usual state of health until about April 2016 when she first noted some nausea vomiting and upper abdominal discomfort. She initially thought that this might be a stomach flu and was managed conservatively. Her nausea was an associated with dyspepsia, loss of appetite and progressive weight loss of about 15 pounds. She also noted increased fatigue and some progressive shortness of breath and dyspnea on exertion which led to a cardiac workup a myocardial perfusion stress test on 02/02/2015 which showed a small area of moderate inferior wall ischemia and EKG positive for ischemic changes. Normal ejection fraction of 65%. Patient subsequently had a cardiac catheterization on 02/07/2015 with stenting of her mid LAD and mid RCA with drug-eluting stents.  She notes that her shortness of breath and dyspnea on exertion haven't significantly changed. She was having upper abdominal pain and ongoing nausea and anorexia and subsequently had a CT scan of her abdomen on 02/15/2015 which showed widespread hepatic metastatic disease as well as probable metastatic adenopathy within the porta hepatis and upper retroperitoneum and no overt evidence of a primary malignancy in the abdomen.   Patient was subsequently set up for an ultrasound-guided biopsy by her primary care physician for 02/21/2015 and reported for this procedure which was appropriately canceled given that she was on 2 different antiplatelet agents aspirin and Brilinta in the  setting of having recent drug-eluting stents placed about 2 weeks ago.  Patient currently notes ongoing anorexia and upper abdominal discomfort that is not very pronounced at rest but worsens with palpation of her upper belly. Notes that her stools are somewhat more loose but no evidence of blood in the stools or black stools. Currently not having any overt emesis.    MEDICAL HISTORY:  Past Medical History  Diagnosis Date  . Osteopenia   . Hypercholesterolemia   . Menopause   . Rosacea   . Lactose intolerance   . TMJ (dislocation of temporomandibular joint)   . Fatigue   . Rosacea   . TMJ (dislocation of temporomandibular joint)   . Mild aortic stenosis     echo 01/2015  . Coronary artery disease   . Family history of adverse reaction to anesthesia     "my mother gets PONV"  . GERD (gastroesophageal reflux disease)   . Epileptic seizure     "controlled; started w/my periods; last one was years ago" (02/07/2015)  . 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 -Coronary artery disease status post angioplasty and stenting on 02/07/2015  Patient Active Problem List   Diagnosis Date Noted  . CAD S/P RCA, LAD DES 02/16/2015  . Carotid disease, bilateral 02/16/2015  . Essential hypertension 02/08/2015  . Unstable angina 02/07/2015  . Fatigue due to excessive exertion 01/12/2015  . DOE (dyspnea on exertion) 01/12/2015  . Heart palpitations 01/12/2015  . Heart murmur 01/12/2015  . Left carotid bruit 01/12/2015  . Seizures 01/27/2014  . Osteopenia 01/27/2014  .  Dyslipidemia 01/27/2014  . Menopause 01/27/2014  . Rosacea 01/27/2014  . Lactose intolerance 01/27/2014    SURGICAL HISTORY: Past Surgical History  Procedure Laterality Date  . Tonsillectomy    . Coronary angioplasty    . Ankle fracture surgery Left ~ 1977    "put 4 pins in"  .  Hemangioma excision  ~ 2006    "inside my mouth"  . Hemangioma w/ laser excision  "yearly since ~ 2006"    "inside my mouth"  . Cardiac catheterization N/A 02/07/2015    Procedure: Left Heart Cath and Coronary Angiography;  Surgeon: Burnell Blanks, MD;  Location: [facility];  Service: Cardiovascular;  Laterality: N/A;  . Cardiac catheterization N/A 02/07/2015    Procedure: Coronary Stent Intervention;  Surgeon: Burnell Blanks, MD;  Location: [facility];  Service: Cardiovascular;  Laterality: N/A;    SOCIAL HISTORY: History   Social History  . Marital Status: Married    Spouse Name: Judy Cummings  . Number of Children: 2  . Years of Education: college   Occupational History  . retired    Social History Main Topics  . Smoking status: Never Smoker   . Smokeless tobacco: Never Used  . Alcohol Use: No  . Drug Use: No  . Sexual Activity: Not on file   Other Topics Concern  . Not on file   Social History Narrative    FAMILY HISTORY: Family History  Problem Relation Age of Onset  . Diabetes Mellitus I Mother   . Hypertension Mother   . Atrial fibrillation Mother   . Heart attack Father   . CVA Father   . CAD Father   . Hyperlipidemia Sister   . Heart attack Maternal Grandfather   . CVA Paternal Grandmother   . Colon cancer Paternal Grandfather    paternal grandfather had colon cancer at age 24 Paternal grandfather's brother died of colon cancer unknown age Paternal grandfather's sister had unknown type of cancer. No history of cancer on her mother's side of the family No history of known blood disorders, bleeding disorders or clotting disorders.  ALLERGIES:  is allergic to boniva; erythromycin; fosamax; glucosamine forte; guaifenesin & derivatives; nexium; and zostavax.  MEDICATIONS:  Current Outpatient Prescriptions  Medication Sig Dispense Refill  . acetaminophen (TYLENOL) 325 MG tablet Take 650 mg by mouth every 6 (six) hours as needed for  mild pain.    [name] AMBULATORY NON FORMULARY MEDICATION Take 90 mg by mouth 2 (two) times daily. Medication Name: Brilinta 90 mg BID provided by [employer] Research study (Do Not Fill)    . aspirin 81 MG tablet Take 81 mg by mouth daily.    [name] atorvastatin (LIPITOR) 80 MG tablet Take 1 tablet (80 mg total) by mouth daily at 6 PM. 30 tablet 11  . Azelaic Acid 15 % cream Apply 1 application topically daily. 1 application to affected area    . carbamazepine (TEGRETOL XR) 400 MG 12 hr tablet Take 400 mg by mouth at bedtime. Take along with the 200mg  XR tablet per patient    . fluticasone (FLONASE) 50 MCG/ACT nasal spray Place 1 spray into both nostrils daily. 1 spray in each nostril    . lisinopril (PRINIVIL,ZESTRIL) 5 MG tablet Take 1 tablet (5 mg total) by mouth daily. 30 tablet 11  . metoprolol tartrate (LOPRESSOR) 25 MG tablet Take 0.5 tablets (12.5 mg total) by mouth 2 (two) times daily. 60 tablet 4  . Multiple Vitamins-Minerals (MULTIVITAMIN & MINERAL PO)  Take 1 tablet by mouth daily.    . TEGRETOL-XR 200 MG 12 hr tablet Take 200 mg by mouth daily. Take along with the 400mg  XR tablet. Per patient    . zaleplon (SONATA) 5 MG capsule Take 5 mg by mouth at bedtime as needed for sleep.     No current facility-administered medications for this visit.    REVIEW OF SYSTEMS:   Constitutional: Denies fevers, chills or abnormal night sweats. Does note fatigue and loss of appetite Eyes: Denies blurriness of vision, double vision or watery eyes Ears, nose, mouth, throat, and face: Denies mucositis or sore throat Respiratory: Denies cough, dyspnea or wheezes Cardiovascular: Denies palpitation, chest discomfort or lower extremity swelling Gastrointestinal:  Patient notes nausea. With no emesis. Upper abdominal discomfort. No difficulty with swallowing. No dysphagia Skin: Denies abnormal skin rashes Lymphatics: Denies new lymphadenopathy or easy bruising Neurological:Denies numbness, tingling or new  weaknesses Behavioral/Psych: Mood is stable, no new changes  All other systems were reviewed with the patient and are negative.  PHYSICAL EXAMINATION: ECOG PERFORMANCE STATUS: 1 - Symptomatic but completely ambulatory  Filed Vitals:   02/21/15 1359  BP: 159/78  Pulse: 76  Temp: 98.4 F (36.9 C)  Resp: 16   Filed Weights   02/21/15 1359  Weight: 117 lb 11.2 oz (53.388 kg)    GENERAL:alert, no distress and comfortable SKIN: skin color, texture, turgor are normal, no rashes or significant lesions EYES: normal, conjunctiva are pink and non-injected, sclera clear non-icteric. OROPHARYNX:no exudate, hemangioma noted under the tongue and also involving the tongue and lips. NECK: supple, thyroid normal size, non-tender, without nodularity LYMPH:  no palpable lymphadenopathy in the cervical, axillary or inguinal LUNGS: clear to auscultation and percussion with normal breathing effort HEART: regular rate & rhythm and no murmurs and no lower extremity edema ABDOMEN:abdomen mildly distended with tenderness to palpation over the right upper quadrant and epigastric area. No rigidity or rebound.  Musculoskeletal: no cyanosis of digits and no clubbing  PSYCH: alert & oriented x 3 with fluent speech NEURO: no focal motor/sensory deficits  LABORATORY DATA:  I have reviewed the data as listed Lab Results  Component Value Date   WBC 11.7* 02/21/2015   HGB 12.2 02/21/2015   HCT 37.0 02/21/2015   MCV 94.7 02/21/2015   PLT 230 02/21/2015    Recent Labs  02/04/15 1034 02/08/15 0510 02/21/15 1649  NA 137 135 134*  K 3.8 4.1 4.9  CL 100 101  --   CO2 33* 29 25  GLUCOSE 85 105* 96  BUN 8 <5* 10.7  CREATININE 0.58 0.56 0.7  CALCIUM 9.1 8.5* 9.7  GFRNONAA  --  >60  --   GFRAA  --  >60  --   PROT  --   --  7.5  ALBUMIN  --   --  3.7  AST  --   --  67*  ALT  --   --  39  ALKPHOS  --   --  237*  BILITOT  --   --  0.48   .  Lab Results  Component Value Date   CA19-9 11838.7*  02/21/2015    RADIOGRAPHIC STUDIES: I have personally reviewed the radiological images as listed and agreed with the findings in the report. Dg Chest 2 View  02/18/2015   CLINICAL DATA:  Status post cardiac stent placement 1 month ago. It dyspnea on exertion and chest palpitations.  EXAM: CHEST  2 VIEW  COMPARISON:  None.  FINDINGS: Lungs  are clear. Heart size is normal. No pneumothorax or pleural effusion. Coronary artery stent in the LAD is noted. No pneumothorax or pleural effusion.  IMPRESSION: No acute disease.   Electronically Signed   By: Inge Rise M.D.   On: 02/18/2015 11:40   Ct Abd Wo & W Cm  02/15/2015   CLINICAL DATA:  Upper abdominal pain with burning sensation down, weight loss and decreased appetite. Abnormal outside ultrasound. Evaluate for liver mass. Initial encounter.  EXAM: CT ABDOMEN WITHOUT AND WITH CONTRAST  TECHNIQUE: Multidetector CT imaging of the abdomen was performed following the standard protocol before and following the bolus administration of intravenous contrast.  CONTRAST:  100 ml Omnipaque 300.  COMPARISON:  None.  FINDINGS: Lower chest: Clear lung bases. No significant pleural or pericardial effusion. Coronary artery calcifications are noted.  Hepatobiliary: There is widespread hepatic neoplastic disease with multiple peripherally hypervascular lesions. Confluent tumor superiorly in the left hepatic lobe measures up to 7.2 cm transverse on image 23. There is also confluent tumor posteriorly in the right hepatic lobe. A peripherally enhancing lesion lateral to the gallbladder is best seen on the delayed images (series 12), measuring 4.4 x 3.1 cm on image number 25. No underlying morphologic changes of cirrhosis identified. No evidence of gallbladder wall thickening, gallstones or biliary dilatation.  Pancreas: Unremarkable. No pancreatic ductal dilatation or surrounding inflammatory changes.  Spleen: Normal in size without focal abnormality.  Adrenals/Urinary Tract:  Both adrenal glands appear normal.The kidneys appear normal without evidence of urinary tract calculus or hydronephrosis. Bladder not imaged.  Stomach/Bowel: No evidence of bowel wall thickening, distention or surrounding inflammatory change.  Vascular/Lymphatic: There are enlarged lymph nodes within the porta hepatis, measuring 13 mm on images 36 and 38 of series 11. There is an aortocaval node measuring 10 mm on image 46. Mild aortoiliac atherosclerosis noted.  Other: No ascites or peritoneal nodularity identified.  Musculoskeletal: No acute or significant osseous findings.  IMPRESSION: 1. Widespread hepatic metastatic disease. No underlying morphologic changes of cirrhosis are demonstrated to suggest multifocal hepatocellular carcinoma. 2. No primary malignancy identified within the abdomen. The pelvis was not imaged. 3. Probable metastatic adenopathy within the porta hepatis and upper retroperitoneum. 4. The hepatic disease should be amenable to percutaneous tissue sampling. These results will be called to the ordering clinician or representative by the Radiologist Assistant, and communication documented in the PACS or zVision Dashboard.   Electronically Signed   By: Richardean Sale M.D.   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 Also having dyspeptic symptoms and anorexia has lost about 15 pounds in the last couple of months. 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  Plan -Getting a tissue diagnosis at this point is somewhat challenging given the patient has recently had drug-eluting stents placed in her LAD and RCA for coronary artery disease and is on dual antiplatelet therapy which will be difficult to interrupt the patient is only about 2 weeks out from stent placement. -I shall discuss with her cardiologist the best approach to managing her antiplatelet  therapy to allow for tissue diagnosis. -PET/CT scan accurately evaluate the extent of her disease and determine if there is an alternative biopsy site that might be less likely to bleed or that might be easier to source control if bleeding occurs. -Gastroenterology referral for upper GI endoscopy -Tumor markers CA-19-9, CEA, the chromogranin  A levels. -if pancreatic cancer is confirmed somewhat the patient's dyspeptic symptoms might be from exocrine pancreatic insufficiency we will consider adding pancreatic enzyme supplements  Return to clinic with Dr. Sullivan Lone in 7 days with plan to start treatment based on tissue diagnosis.   Protein calorie malnutrition We will refer the patient to nutrition specialist Will add pancreatic enzyme supplements once diagnosis confirmed.  Pain management Patient's pain is reasonably controlled currently without any overt pain medication requirements. We shall anticipate that pain management might become an increasing issue. Plan -Closely monitor the patient for evolving symptoms and proactively manage her pain.   Dyspepsia Nausea and dyspepsia likely due to a combination of liver metastases and exocrine pancreatic insufficiency. Patient has been referred to gastroenterology for an EGD to rule out other etiologies. Order Zofran when necessary if needed for nausea patient currently is not requesting any antinausea medication     All questions were answered. The patient knows to call the clinic with any problems, questions or concerns. I spent 70 minutes counseling the patient face to face. The total time spent in the appointment was 80 minutes and more than 50% was on counseling.     Brunetta Genera, MD 02/22/2015 6:17 AM

## 2015-02-23 LAB — CEA: CEA: 10.7 ng/mL — ABNORMAL HIGH (ref 0.0–5.0)

## 2015-02-23 LAB — AFP TUMOR MARKER: AFP-Tumor Marker: 6.8 ng/mL — ABNORMAL HIGH (ref <6.1)

## 2015-02-24 ENCOUNTER — Other Ambulatory Visit (HOSPITAL_COMMUNITY): Payer: Self-pay | Admitting: Gastroenterology

## 2015-02-24 ENCOUNTER — Other Ambulatory Visit: Payer: Self-pay | Admitting: Gastroenterology

## 2015-02-24 DIAGNOSIS — C787 Secondary malignant neoplasm of liver and intrahepatic bile duct: Secondary | ICD-10-CM

## 2015-02-25 ENCOUNTER — Other Ambulatory Visit: Payer: Self-pay | Admitting: *Deleted

## 2015-02-25 MED ORDER — AMBULATORY NON FORMULARY MEDICATION: 81.0000 mg | Freq: Every day | Status: DC

## 2015-02-25 NOTE — Addendum Note (Signed)
Addended by: Sullivan Lone on: 02/25/2015 02:58 PM   Modules accepted: Orders

## 2015-02-26 ENCOUNTER — Encounter: Payer: Self-pay | Admitting: Hematology

## 2015-02-26 ENCOUNTER — Encounter: Payer: Self-pay | Admitting: Cardiology

## 2015-02-28 ENCOUNTER — Encounter (HOSPITAL_COMMUNITY)
Admission: RE | Admit: 2015-02-28 | Discharge: 2015-02-28 | Disposition: A | Discharge status: Home / Self Care | Payer: Medicare PPO | Source: Ambulatory Visit | Attending: Hematology | Admitting: Hematology

## 2015-02-28 ENCOUNTER — Telehealth: Payer: Self-pay | Admitting: Cardiology

## 2015-02-28 DIAGNOSIS — I10 Essential (primary) hypertension: Secondary | ICD-10-CM | POA: Diagnosis present

## 2015-02-28 DIAGNOSIS — E780 Pure hypercholesterolemia: Secondary | ICD-10-CM | POA: Diagnosis present

## 2015-02-28 DIAGNOSIS — C801 Malignant (primary) neoplasm, unspecified: Principal | ICD-10-CM

## 2015-02-28 DIAGNOSIS — G479 Sleep disorder, unspecified: Secondary | ICD-10-CM | POA: Diagnosis present

## 2015-02-28 DIAGNOSIS — Z888 Allergy status to other drugs, medicaments and biological substances status: Secondary | ICD-10-CM

## 2015-02-28 DIAGNOSIS — Z6820 Body mass index (BMI) 20.0-20.9, adult: Secondary | ICD-10-CM

## 2015-02-28 DIAGNOSIS — E785 Hyperlipidemia, unspecified: Secondary | ICD-10-CM | POA: Diagnosis present

## 2015-02-28 DIAGNOSIS — J069 Acute upper respiratory infection, unspecified: Secondary | ICD-10-CM | POA: Diagnosis present

## 2015-02-28 DIAGNOSIS — C772 Secondary and unspecified malignant neoplasm of intra-abdominal lymph nodes: Secondary | ICD-10-CM

## 2015-02-28 DIAGNOSIS — E46 Unspecified protein-calorie malnutrition: Secondary | ICD-10-CM | POA: Diagnosis present

## 2015-02-28 DIAGNOSIS — G40909 Epilepsy, unspecified, not intractable, without status epilepticus: Secondary | ICD-10-CM | POA: Diagnosis present

## 2015-02-28 DIAGNOSIS — I2510 Atherosclerotic heart disease of native coronary artery without angina pectoris: Secondary | ICD-10-CM | POA: Diagnosis present

## 2015-02-28 DIAGNOSIS — I350 Nonrheumatic aortic (valve) stenosis: Secondary | ICD-10-CM | POA: Diagnosis present

## 2015-02-28 DIAGNOSIS — Z955 Presence of coronary angioplasty implant and graft: Secondary | ICD-10-CM

## 2015-02-28 DIAGNOSIS — R591 Generalized enlarged lymph nodes: Secondary | ICD-10-CM | POA: Diagnosis present

## 2015-02-28 DIAGNOSIS — C787 Secondary malignant neoplasm of liver and intrahepatic bile duct: Principal | ICD-10-CM | POA: Diagnosis present

## 2015-02-28 DIAGNOSIS — Z881 Allergy status to other antibiotic agents status: Secondary | ICD-10-CM

## 2015-02-28 DIAGNOSIS — Z8249 Family history of ischemic heart disease and other diseases of the circulatory system: Secondary | ICD-10-CM

## 2015-02-28 DIAGNOSIS — M8580 Other specified disorders of bone density and structure, unspecified site: Secondary | ICD-10-CM | POA: Diagnosis present

## 2015-02-28 DIAGNOSIS — K219 Gastro-esophageal reflux disease without esophagitis: Secondary | ICD-10-CM | POA: Diagnosis present

## 2015-02-28 DIAGNOSIS — E739 Lactose intolerance, unspecified: Secondary | ICD-10-CM | POA: Diagnosis present

## 2015-02-28 DIAGNOSIS — L719 Rosacea, unspecified: Secondary | ICD-10-CM | POA: Diagnosis present

## 2015-02-28 DIAGNOSIS — Z887 Allergy status to serum and vaccine status: Secondary | ICD-10-CM

## 2015-02-28 LAB — GLUCOSE, CAPILLARY: Glucose-Capillary: 111 mg/dL — ABNORMAL HIGH (ref 65–99)

## 2015-02-28 MED ORDER — FLUDEOXYGLUCOSE F - 18 (FDG) INJECTION
5.9600 | Freq: Once | INTRAVENOUS | Status: AC | PRN
  Administered 2015-02-28: 5.96 via INTRAVENOUS

## 2015-02-28 NOTE — Telephone Encounter (Signed)
Per Dr. Radford Pax, "Patient is scheduled for Bx on 7/25. She will need to be admitted to tele bed on Wednesday 7/20 for holding Brilinta and starting IV Integrellin. She has been instructed not to take her Brilinta the morning of 7/20.  I have discussed this with Cardiac pharmacist and they will follow platelet function and start IV Integrellin when appropriate. This will give patient 5 days off Brilinta in preparation for biopsy. Please arrange direct admit on Wednesday. She needs to be contacted with info tomorrow afternoon.   Traci Turner"   Spoke to bed management and they are aware patient is to be admitted to tele bed Wednesday, 7/20, for initiation of IV Integrellin for liver biopsy. Patient understands Bed Management will call her with a bed assignment and time to arrive Wednesday morning.  Patient understands to hold Brilinta the morning of admission. Instructed patient to keep her phone on her Wednesday. Patient agrees with treatment plan.

## 2015-02-28 NOTE — Telephone Encounter (Signed)
Follow up    Pt Returning your call.

## 2015-02-28 NOTE — Telephone Encounter (Signed)
Patient called for medication clarification. Instructed patient to take other medications as directed, but to make sure to hold her Brilinta the morning of admission. Patient agrees with treatment plan.

## 2015-02-28 NOTE — Telephone Encounter (Signed)
Follow Up ° ° ° ° ° °Pt returning Katy's phone call. °

## 2015-03-01 ENCOUNTER — Telehealth: Payer: Self-pay | Admitting: *Deleted

## 2015-03-01 ENCOUNTER — Other Ambulatory Visit (HOSPITAL_COMMUNITY)
Admission: RE | Admit: 2015-03-01 | Discharge: 2015-03-01 | Disposition: A | Discharge status: Home / Self Care | Payer: Medicare PPO | Source: Ambulatory Visit | Attending: Hematology | Admitting: Hematology

## 2015-03-01 ENCOUNTER — Encounter: Payer: Self-pay | Admitting: Hematology

## 2015-03-01 ENCOUNTER — Telehealth: Payer: Self-pay | Admitting: Hematology

## 2015-03-01 ENCOUNTER — Ambulatory Visit (HOSPITAL_BASED_OUTPATIENT_CLINIC_OR_DEPARTMENT_OTHER): Payer: Medicare PPO | Admitting: Hematology

## 2015-03-01 ENCOUNTER — Ambulatory Visit: Payer: Medicare PPO

## 2015-03-01 VITALS — BP 132/68 | HR 87 | Temp 101.0°F | Resp 18 | Ht 63.0 in | Wt 113.6 lb

## 2015-03-01 DIAGNOSIS — J40 Bronchitis, not specified as acute or chronic: Secondary | ICD-10-CM

## 2015-03-01 DIAGNOSIS — R059 Cough, unspecified: Secondary | ICD-10-CM | POA: Insufficient documentation

## 2015-03-01 DIAGNOSIS — R52 Pain, unspecified: Secondary | ICD-10-CM

## 2015-03-01 DIAGNOSIS — E46 Unspecified protein-calorie malnutrition: Secondary | ICD-10-CM

## 2015-03-01 DIAGNOSIS — R05 Cough: Secondary | ICD-10-CM | POA: Insufficient documentation

## 2015-03-01 DIAGNOSIS — C787 Secondary malignant neoplasm of liver and intrahepatic bile duct: Secondary | ICD-10-CM | POA: Insufficient documentation

## 2015-03-01 DIAGNOSIS — R109 Unspecified abdominal pain: Secondary | ICD-10-CM

## 2015-03-01 DIAGNOSIS — C772 Secondary and unspecified malignant neoplasm of intra-abdominal lymph nodes: Secondary | ICD-10-CM | POA: Diagnosis not present

## 2015-03-01 DIAGNOSIS — C801 Malignant (primary) neoplasm, unspecified: Secondary | ICD-10-CM

## 2015-03-01 DIAGNOSIS — R1110 Vomiting, unspecified: Secondary | ICD-10-CM

## 2015-03-01 LAB — INFLUENZA PANEL BY PCR (TYPE A & B)
H1N1FLUPCR: NOT DETECTED
Influenza A By PCR: NEGATIVE
Influenza B By PCR: NEGATIVE

## 2015-03-01 MED ORDER — PANCRELIPASE (LIP-PROT-AMYL) 12000-38000 UNITS PO CPEP: 12000.0000 [IU] | ORAL_CAPSULE | Freq: Three times a day (TID) | ORAL | Status: AC

## 2015-03-01 MED ORDER — DEXAMETHASONE 2 MG PO TABS: 2.0000 mg | ORAL_TABLET | Freq: Two times a day (BID) | ORAL | Status: AC

## 2015-03-01 MED ORDER — DEXAMETHASONE 2 MG PO TABS: 2.0000 mg | ORAL_TABLET | Freq: Two times a day (BID) | ORAL | Status: DC

## 2015-03-01 MED ORDER — ONDANSETRON HCL 8 MG PO TABS: 8.0000 mg | ORAL_TABLET | Freq: Three times a day (TID) | ORAL | Status: DC | PRN

## 2015-03-01 MED ORDER — LEVOFLOXACIN 500 MG PO TABS: 500.0000 mg | ORAL_TABLET | Freq: Every day | ORAL | Status: AC

## 2015-03-01 MED ORDER — OXYCODONE HCL 5 MG PO TABS: 5.0000 mg | ORAL_TABLET | ORAL | Status: DC | PRN

## 2015-03-01 NOTE — Assessment & Plan Note (Addendum)
Given that the patient's tumor markers show a significantly elevated CA-19-9 of nearly 12,000 a pancreatic primary is most likely though a hepatobiliary primary cannot be ruled out at this time. PET/CT scan reviewed and does not shed additional light on primary source of her malignancy. She has a couple of enlarged lymph nodes in the mediastinum old this might be related to her recent respiratory infection based on her current symptoms. The extent of liver involvement suggests she has impending visceral crisis and lends tself to urgency of starting treatment. Patient's upper abdominal pain is slightly worse especially with coughing. Notes significant nausea and some diarrhea. Loss of appetite. Notes that she has lost another 5 pounds since her last visit.   Plan -Over the last week I had multiple discussions with her cardiologist's especially Dr. Golden Hurter to develop a plan for managing her antiplatelet therapy to allow for a biopsy. -I also discussed with Dr. Paulita Fujita her GI doctor about his appropriate decision to hold off on endoscopy given that she would not be able to get a biopsy while on dual antiplatelet therapy. -She has been arranged to have an ultrasound-guided biopsy on 03/07/2015 by radiology. She shall be admitted to the hospital under telemetry for discontinuation of her Brilinta and starting IV Integrilin to allow for Brilinta washout for about 5 days prior to biopsy. -she'll follow-up with me on 03/10/2015 along with the chemotherapy infusion room visit to start treatment based on her final biopsy results. 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 -Consideration of port placement while off dual antiplatelet therapy.

## 2015-03-01 NOTE — Assessment & Plan Note (Signed)
Her cough appears to be related to contact with her mother and a nursing home who had about your upper respiratory tract infection. She appears to have a a upper respiratory tract infection resulting in significant cough and potentially some diarrhea. Has a fever up to 101 which could be related to her U RTI are perhaps might be tumor fever. No overt new shortness of breath. No pleuritic chest pain concerning for PE. Patient is not hypoxic. Respiratory examination was relatively benign. Plan -viral respiratory panel was ordered but might likely only be sent for influenza A and B based on lab options. -We'll empirically treat with Levaquin 500 mg daily for 5 days -Her prescribed narcotics should work as a cough suppressant as well.

## 2015-03-01 NOTE — Telephone Encounter (Signed)
Gave patient avs report and appointments for July. Per desk nurse came to scheduling desk and reported patient did not need to return to lab.

## 2015-03-01 NOTE — Progress Notes (Signed)
[name]  HEMATOLOGY ONCOLOGY PROGRESS NOTE  Patient Care Team: Shirline Frees, MD as PCP - General (Family Medicine) Brunetta Genera, MD as Consulting Physician (Hematology)  Dr. 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 Widespread hepatic metastatic disease. No underlying morphologic changes of cirrhosis are demonstrated to suggest multifocal hepatocellular carcinoma. 2. No primary malignancy identified within the abdomen. The pelvis was not imaged.    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 Widespread neoplastic disease throughout the liver and lymph nodes of the upper abdomen. 2. No definite extrahepatic primary malignancy identified. 3. Potential early nodal metastases within the right internal mammary, subcarinal and left hilar station    INTERVAL HISTORY: Patient is here for her scheduled clinic follow-up after her last clinic visit on 02/21/2015. She notes feeling significantly more unwell. Notes that she visited her mother about a week ago and feels like she had may have contracted her respiratory infection. Has a fever about 101. Notes bothersome cough. No hypoxia. No acute change or worsening in shortness of breath. Notes some increased diarrhea, no blood in stools. No black stools.. Notes worsening nausea and vomiting. Notes that her cough is causing significantly more noticeable upper abdominal pain. Poor appetite. Has lost another 5 pounds since her last clinic visit.  REVIEW OF SYSTEMS:    Pertinent positive and negative review of systems as noted in interval history. Remaining 10 point review of systems is unrevealing.  I have reviewed the past medical history, past surgical history, social history and family history with the patient and they are  unchanged from previous note.  ALLERGIES:  is allergic to boniva; erythromycin; fosamax; glucosamine forte; guaifenesin & derivatives; nexium; and zostavax.  MEDICATIONS:  Current Outpatient Prescriptions  Medication Sig Dispense Refill  . acetaminophen (TYLENOL) 325 MG tablet Take 650 mg by mouth every 6 (six) hours as needed for mild pain.    [name] AMBULATORY NON FORMULARY MEDICATION Take 90 mg by mouth 2 (two) times daily. Medication Name: Brilinta 90 mg BID provided by [employer] Research study (Do Not Fill)    . AMBULATORY NON FORMULARY MEDICATION Take 81 mg by mouth daily. Medication Name: ASA 81 mg daily Provided by [facility] study    . atorvastatin (LIPITOR) 80 MG tablet Take 1 tablet (80 mg total) by mouth daily at 6 PM. 30 tablet 11  . Azelaic Acid 15 % cream Apply 1 application topically daily. 1 application to affected area    . carbamazepine (TEGRETOL XR) 400 MG 12 hr tablet Take 400 mg by mouth at bedtime. Take along with the 200mg  XR tablet per patient    . dexamethasone (DECADRON) 2 MG tablet Take 1 tablet (2 mg total) by mouth 2 (two) times daily with a meal. 30 tablet 0  . fluticasone (FLONASE) 50 MCG/ACT nasal spray Place 1 spray into both nostrils daily. 1 spray in each nostril    . levofloxacin (LEVAQUIN) 500 MG tablet Take 1 tablet (500 mg total) by mouth daily. 5 tablet 0  . lipase/protease/amylase (CREON) 12000 UNITS CPEP capsule Take 1 capsule (12,000 Units total) by mouth 3 (three) times daily before meals. 270 capsule 1  . lisinopril (PRINIVIL,ZESTRIL) 5 MG tablet Take 1 tablet (5 mg total) by mouth daily. 30 tablet 11  . metoprolol tartrate (LOPRESSOR)  25 MG tablet Take 0.5 tablets (12.5 mg total) by mouth 2 (two) times daily. 60 tablet 4  . Multiple Vitamins-Minerals (MULTIVITAMIN & MINERAL PO) Take 1 tablet by mouth daily.    . ondansetron (ZOFRAN) 8 MG tablet Take 1 tablet (8 mg total) by mouth every 8 (eight) hours as needed for nausea or vomiting. 30 tablet 3  .  oxyCODONE (OXY IR/ROXICODONE) 5 MG immediate release tablet Take 1 tablet (5 mg total) by mouth every 4 (four) hours as needed for severe pain. 60 tablet 0  . TEGRETOL-XR 200 MG 12 hr tablet Take 200 mg by mouth daily. Take along with the 400mg  XR tablet. Per patient    . zaleplon (SONATA) 5 MG capsule Take 5 mg by mouth at bedtime as needed for sleep.     No current facility-administered medications for this visit.    PHYSICAL EXAMINATION: ECOG PERFORMANCE STATUS: 1 - Symptomatic but completely ambulatory  Filed Vitals:   03/01/15 1212  BP: 132/68  Pulse: 87  Temp: 101 F (38.3 C)  Resp: 18   Filed Weights   03/01/15 1212  Weight: 113 lb 9.6 oz (51.529 kg)   GENERAL: alert, understandable anxiety. Fatigue. Mild distress from cough nausea or vomiting. SKIN: skin color, texture, turgor are normal, no rashes or significant lesions EYES: normal, Conjunctiva are pink and non-injected, sclera clear OROPHARYNX: No exudate, no erythema and lips, buccal mucosa, and tongue normal  NECK: supple, thyroid normal size, non-tender, without nodularity LYMPH:  no palpable lymphadenopathy in the cervical, axillary or inguinal LUNGS: clear to auscultation and percussion with normal breathing effort HEART: regular rate & rhythm and no murmurs and no lower extremity edema ABDOMEN:abdomen soft, non-tender and normal bowel sounds Musculoskeletal:no cyanosis of digits and no clubbing  NEURO: alert & oriented x 3 with fluent speech, no focal motor/sensory deficits  LABORATORY DATA:  I have reviewed the data as listed    Component Value Date/Time   NA 134* 02/21/2015 1649   NA 135 02/08/2015 0510   K 4.9 02/21/2015 1649   K 4.1 02/08/2015 0510   CL 101 02/08/2015 0510   CO2 25 02/21/2015 1649   CO2 29 02/08/2015 0510   GLUCOSE 96 02/21/2015 1649   GLUCOSE 105* 02/08/2015 0510   BUN 10.7 02/21/2015 1649   BUN <5* 02/08/2015 0510   CREATININE 0.7 02/21/2015 1649   CREATININE 0.56 02/08/2015  0510   CALCIUM 9.7 02/21/2015 1649   CALCIUM 8.5* 02/08/2015 0510   PROT 7.5 02/21/2015 1649   ALBUMIN 3.7 02/21/2015 1649   AST 67* 02/21/2015 1649   ALT 39 02/21/2015 1649   ALKPHOS 237* 02/21/2015 1649   BILITOT 0.48 02/21/2015 1649   GFRNONAA >60 02/08/2015 0510   GFRAA >60 02/08/2015 0510    No results found for: SPEP, UPEP  Lab Results  Component Value Date   WBC 11.7* 02/21/2015   NEUTROABS 9.4* 02/21/2015   HGB 12.2 02/21/2015   HCT 37.0 02/21/2015   MCV 94.7 02/21/2015   PLT 230 02/21/2015      Chemistry      Component Value Date/Time   NA 134* 02/21/2015 1649   NA 135 02/08/2015 0510   K 4.9 02/21/2015 1649   K 4.1 02/08/2015 0510   CL 101 02/08/2015 0510   CO2 25 02/21/2015 1649   CO2 29 02/08/2015 0510   BUN 10.7 02/21/2015 1649   BUN <5* 02/08/2015 0510   CREATININE 0.7 02/21/2015 1649   CREATININE 0.56 02/08/2015 0510  Component Value Date/Time   CALCIUM 9.7 02/21/2015 1649   CALCIUM 8.5* 02/08/2015 0510   ALKPHOS 237* 02/21/2015 1649   AST 67* 02/21/2015 1649   ALT 39 02/21/2015 1649   BILITOT 0.48 02/21/2015 1649       RADIOGRAPHIC STUDIES: I have personally reviewed the radiological images as listed and agreed with the findings in the report. I reviewed the actual images with the patient and her husband. Nm Pet Image Initial (pi) Skull Base To Thigh  02/28/2015   CLINICAL DATA:  Initial treatment strategy for hepatic metastatic disease of unknown origin.  EXAM: NUCLEAR MEDICINE PET SKULL BASE TO THIGH  TECHNIQUE: 5.96 mCi F-18 FDG was injected intravenously. Full-ring PET imaging was performed from the skull base to thigh after the radiotracer. CT data was obtained and used for attenuation correction and anatomic localization.  FASTING BLOOD GLUCOSE:  Value: 111 mg/dl  COMPARISON:  Abdominal CT 02/15/2015.  FINDINGS: NECK  No hypermetabolic cervical lymph nodes are identified.There are no lesions of the pharyngeal mucosal space. Dense right  thyroid calcification noted.  CHEST  There is minimal hypermetabolic activity in the left hilar and subcarinal regions without apparent corresponding enlarged lymph nodes. This activity has an SUV max of 3.7 and 4.1, respectively. There is also mildly increased activity in the lower right parasternal region with an SUV max of 3.3. This may correspond with a pleural lesion or internal mammary lymph node, although no discretely enlarged lymph node identified. There is no suspicious pulmonary activity.  ABDOMEN/PELVIS  As demonstrated on CT, there is widespread hypermetabolic tumor throughout the liver involving all segments. There are confluent hypermetabolic components within the left lobe (SUV max 13.4) and posteriorly in the right lobe (SUV max 10.5). There are hypermetabolic lymph nodes within the upper abdomen involving the porta hepatis and retroperitoneum (SUV max 7.0). No abnormal metabolic activity seen within the spleen, adrenal glands or pancreas. No bowel lesions identified. There is no pelvic adenopathy.  SKELETON  There is no hypermetabolic activity to suggest osseous metastatic disease. There is no abnormal activity within the breasts.  IMPRESSION: 1. Widespread neoplastic disease throughout the liver and lymph nodes of the upper abdomen. 2. No definite extrahepatic primary malignancy identified. 3. Potential early nodal metastases within the right internal mammary, subcarinal and left hilar stations. 4. Tissue sampling recommended.   Electronically Signed   By: Richardean Sale M.D.   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 PET/CT scan reviewed and does not shed additional light on primary source of her malignancy. She has a couple of enlarged lymph nodes  in the mediastinum old this might be related to her recent respiratory infection based on her current symptoms. The extent of liver involvement suggests she has impending visceral crisis and lends tself to urgency of starting treatment. Patient's upper abdominal pain is slightly worse especially with coughing. Notes significant nausea and some diarrhea. Loss of appetite. Notes that she has lost another 5 pounds since her last visit.   Plan -Over the last week I had multiple discussions with her cardiologist's especially Dr. Golden Hurter to develop a plan for managing her antiplatelet therapy to allow for a biopsy. -I also discussed with Dr. Paulita Fujita her GI doctor about his appropriate decision to  hold off on endoscopy given that she would not be able to get a biopsy while on dual antiplatelet therapy. -She has been arranged to have an ultrasound-guided biopsy on 03/07/2015 by radiology. She shall be admitted to the hospital under telemetry for discontinuation of her Brilinta and starting IV Integrilin to allow for Brilinta washout for about 5 days prior to biopsy. -she'll follow-up with me on 03/10/2015 along with the chemotherapy infusion room visit to start treatment based on her final biopsy results. 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 -Consideration of port placement while off dual antiplatelet therapy.    Pain management Patient's upper abdominal pain is likely related to her metastatic malignancy with significant liver involvement and upper abdominal nodal metastases. Her pain seems to be worse with coughing. Plan -Patient is opiate nave. -We shall start her on oxycodone IR 5 mg every 4 hours when necessary for pain management. This might also  help with her cough. -She is currently having some diarrhea but was advised to use over-the-counter senna if significant constipation resulted from the use of narcotic pain medications. -She was counseled about the fact that the first 5-7 days the narcotics might cause some nausea and that she could use her prescribe Zofran as needed -We will likely need to switch to a combination of her long-acting and short acting pain medication to optimize her pain control. -She has also been given low-dose dexamethasone 2 mg by mouth daily to help with reducing nausea/pain related to hepatic distention as well as to help with her appetite.  Protein calorie malnutrition Patient has lost 5 pounds in the last 1 week since her last clinic visit. This is a function of her poor appetite and nausea vomiting. Plan Her nausea and vomiting have been addressed with a prescription for when necessary Zofran. -Dexamethasone has been added for nausea and as an appetite stimulant at 2 mg by mouth daily. -Referral has been sent to [facility] for nutritional evaluation and management. -Patient was given counseling for optimizing her oral intake. -She was given a prescription for pancreatic enzyme supplements to help with better digesting her food and helping with some of her dyspeptic symptoms.  Cough Her cough appears to be related to contact with her mother and a nursing home who had about your upper respiratory tract infection. She appears to have a a upper respiratory tract infection resulting in significant cough and potentially some diarrhea. Has a fever up to 101 which could be related to her U RTI are perhaps might be tumor fever. No overt new shortness of breath. No pleuritic chest pain concerning for PE. Patient is not hypoxic. Respiratory examination was relatively benign. Plan -viral respiratory panel was ordered but might likely only be sent for influenza A and B based on lab options. -We'll empirically treat  with Levaquin 500 mg daily for 5 days -Her prescribed narcotics should work as a cough suppressant as well.    Orders Placed This Encounter  Procedures  . Respiratory virus panel    Standing Status: Future     Number of Occurrences:      Standing Expiration Date: 02/29/2016  . Influenza A and B    Standing Status: Future     Number of Occurrences:      Standing Expiration Date: 02/29/2016  .  Amb Referral to Nutrition and Diabetic E    Referral Priority:  Urgent    Referral Type:  Consultation    Referral Reason:  Specialty Services Required    Number of Visits Requested:  1   The findings and plan of care were discussed in details with the patient and her accompanying spouse with her consent. Supportive counseling was provided. All questions were answered to their apparent satisfaction. The patient knows to call the clinic with any problems, questions or concerns. No barriers to learning was detected. I spent 40 minutes counseling the patient face to face. The total time spent in the appointment was 55 minutes and more than 50% was on counseling and review of test results     Sullivan Lone, MD 03/01/2015 3:12 PM Hematology oncology staff physician [place].

## 2015-03-01 NOTE — Assessment & Plan Note (Signed)
Patient's upper abdominal pain is likely related to her metastatic malignancy with significant liver involvement and upper abdominal nodal metastases. Her pain seems to be worse with coughing. Plan -Patient is opiate nave. -We shall start her on oxycodone IR 5 mg every 4 hours when necessary for pain management. This might also help with her cough. -She is currently having some diarrhea but was advised to use over-the-counter senna if significant constipation resulted from the use of narcotic pain medications. -She was counseled about the fact that the first 5-7 days the narcotics might cause some nausea and that she could use her prescribe Zofran as needed -We will likely need to switch to a combination of her long-acting and short acting pain medication to optimize her pain control. -She has also been given low-dose dexamethasone 2 mg by mouth daily to help with reducing nausea/pain related to hepatic distention as well as to help with her appetite.

## 2015-03-01 NOTE — Assessment & Plan Note (Signed)
Patient has lost 5 pounds in the last 1 week since her last clinic visit. This is a function of her poor appetite and nausea vomiting. Plan Her nausea and vomiting have been addressed with a prescription for when necessary Zofran. -Dexamethasone has been added for nausea and as an appetite stimulant at 2 mg by mouth daily. -Referral has been sent to [facility] for nutritional evaluation and management. -Patient was given counseling for optimizing her oral intake. -She was given a prescription for pancreatic enzyme supplements to help with better digesting her food and helping with some of her dyspeptic symptoms.

## 2015-03-01 NOTE — Telephone Encounter (Signed)
VM message received from patient @ 8:25 am. TC to patient. She states she has not been feeling well since Friday afternoon with worsening dray, unproductive cough, diarrhea when she eats anything. Generally feels bad. Denies fever. Denies vomiting. Has tried Delsym for cough but it makes her nauseous. Has been taking Imodium for diarrhea with some relief. She states she is only able to eat toast, gingerale and some gatorade.  Patient has appt with Dr. Irene Limbo this afternoon at 1 pm. Advised patient to keep her appt. She said she would, but she wanted to give Dr. Irene Limbo a heads up on how she is feeling.

## 2015-03-02 ENCOUNTER — Telehealth: Payer: Self-pay | Admitting: Cardiology

## 2015-03-02 ENCOUNTER — Encounter (HOSPITAL_COMMUNITY): Payer: Self-pay | Admitting: General Practice

## 2015-03-02 ENCOUNTER — Inpatient Hospital Stay (HOSPITAL_COMMUNITY)
Admission: AD | Admit: 2015-03-02 | Discharge: 2015-03-09 | DRG: 436 | Disposition: A | Discharge status: Home / Self Care | Payer: Medicare PPO | Source: Ambulatory Visit | Attending: Cardiology | Admitting: Cardiology

## 2015-03-02 DIAGNOSIS — C801 Malignant (primary) neoplasm, unspecified: Secondary | ICD-10-CM

## 2015-03-02 DIAGNOSIS — K769 Liver disease, unspecified: Secondary | ICD-10-CM

## 2015-03-02 DIAGNOSIS — E46 Unspecified protein-calorie malnutrition: Secondary | ICD-10-CM | POA: Diagnosis present

## 2015-03-02 DIAGNOSIS — E785 Hyperlipidemia, unspecified: Secondary | ICD-10-CM | POA: Diagnosis present

## 2015-03-02 DIAGNOSIS — I2510 Atherosclerotic heart disease of native coronary artery without angina pectoris: Secondary | ICD-10-CM | POA: Diagnosis present

## 2015-03-02 DIAGNOSIS — R591 Generalized enlarged lymph nodes: Secondary | ICD-10-CM | POA: Diagnosis present

## 2015-03-02 DIAGNOSIS — R569 Unspecified convulsions: Secondary | ICD-10-CM

## 2015-03-02 DIAGNOSIS — C799 Secondary malignant neoplasm of unspecified site: Secondary | ICD-10-CM

## 2015-03-02 DIAGNOSIS — J069 Acute upper respiratory infection, unspecified: Secondary | ICD-10-CM | POA: Diagnosis present

## 2015-03-02 DIAGNOSIS — C229 Malignant neoplasm of liver, not specified as primary or secondary: Secondary | ICD-10-CM | POA: Insufficient documentation

## 2015-03-02 DIAGNOSIS — E739 Lactose intolerance, unspecified: Secondary | ICD-10-CM | POA: Diagnosis present

## 2015-03-02 DIAGNOSIS — Z8249 Family history of ischemic heart disease and other diseases of the circulatory system: Secondary | ICD-10-CM | POA: Diagnosis not present

## 2015-03-02 DIAGNOSIS — C259 Malignant neoplasm of pancreas, unspecified: Secondary | ICD-10-CM | POA: Diagnosis present

## 2015-03-02 DIAGNOSIS — C787 Secondary malignant neoplasm of liver and intrahepatic bile duct: Principal | ICD-10-CM

## 2015-03-02 DIAGNOSIS — Z9861 Coronary angioplasty status: Secondary | ICD-10-CM

## 2015-03-02 DIAGNOSIS — R059 Cough, unspecified: Secondary | ICD-10-CM | POA: Diagnosis present

## 2015-03-02 DIAGNOSIS — I10 Essential (primary) hypertension: Secondary | ICD-10-CM | POA: Diagnosis present

## 2015-03-02 DIAGNOSIS — K7689 Other specified diseases of liver: Secondary | ICD-10-CM | POA: Diagnosis not present

## 2015-03-02 DIAGNOSIS — G40909 Epilepsy, unspecified, not intractable, without status epilepticus: Secondary | ICD-10-CM | POA: Diagnosis present

## 2015-03-02 DIAGNOSIS — L719 Rosacea, unspecified: Secondary | ICD-10-CM | POA: Diagnosis present

## 2015-03-02 DIAGNOSIS — K219 Gastro-esophageal reflux disease without esophagitis: Secondary | ICD-10-CM | POA: Diagnosis present

## 2015-03-02 DIAGNOSIS — R05 Cough: Secondary | ICD-10-CM | POA: Diagnosis present

## 2015-03-02 DIAGNOSIS — I350 Nonrheumatic aortic (valve) stenosis: Secondary | ICD-10-CM | POA: Diagnosis present

## 2015-03-02 DIAGNOSIS — Z955 Presence of coronary angioplasty implant and graft: Secondary | ICD-10-CM | POA: Diagnosis not present

## 2015-03-02 DIAGNOSIS — M8580 Other specified disorders of bone density and structure, unspecified site: Secondary | ICD-10-CM | POA: Diagnosis present

## 2015-03-02 DIAGNOSIS — Z888 Allergy status to other drugs, medicaments and biological substances status: Secondary | ICD-10-CM | POA: Diagnosis not present

## 2015-03-02 DIAGNOSIS — Z887 Allergy status to serum and vaccine status: Secondary | ICD-10-CM | POA: Diagnosis not present

## 2015-03-02 DIAGNOSIS — Z881 Allergy status to other antibiotic agents status: Secondary | ICD-10-CM | POA: Diagnosis not present

## 2015-03-02 DIAGNOSIS — Z6820 Body mass index (BMI) 20.0-20.9, adult: Secondary | ICD-10-CM | POA: Diagnosis not present

## 2015-03-02 DIAGNOSIS — E780 Pure hypercholesterolemia: Secondary | ICD-10-CM | POA: Diagnosis present

## 2015-03-02 DIAGNOSIS — G479 Sleep disorder, unspecified: Secondary | ICD-10-CM | POA: Diagnosis present

## 2015-03-02 LAB — CBC
HEMATOCRIT: 34.5 % — AB (ref 36.0–46.0)
HEMOGLOBIN: 11.9 g/dL — AB (ref 12.0–15.0)
MCH: 31.8 pg (ref 26.0–34.0)
MCHC: 34.5 g/dL (ref 30.0–36.0)
MCV: 92.2 fL (ref 78.0–100.0)
PLATELETS: 124 10*3/uL — AB (ref 150–400)
RBC: 3.74 MIL/uL — ABNORMAL LOW (ref 3.87–5.11)
RDW: 14.6 % (ref 11.5–15.5)
WBC: 8.6 10*3/uL (ref 4.0–10.5)

## 2015-03-02 LAB — CBC WITH DIFFERENTIAL/PLATELET
Basophils Absolute: 0 10*3/uL (ref 0.0–0.1)
Basophils Relative: 0 % (ref 0–1)
Eosinophils Absolute: 0.4 10*3/uL (ref 0.0–0.7)
Eosinophils Relative: 5 % (ref 0–5)
HCT: 36.5 % (ref 36.0–46.0)
Hemoglobin: 12.5 g/dL (ref 12.0–15.0)
Lymphocytes Relative: 9 % — ABNORMAL LOW (ref 12–46)
Lymphs Abs: 0.9 10*3/uL (ref 0.7–4.0)
MCH: 31.8 pg (ref 26.0–34.0)
MCHC: 34.2 g/dL (ref 30.0–36.0)
MCV: 92.9 fL (ref 78.0–100.0)
Monocytes Absolute: 0.7 10*3/uL (ref 0.1–1.0)
Monocytes Relative: 7 % (ref 3–12)
Neutro Abs: 7.1 10*3/uL (ref 1.7–7.7)
Neutrophils Relative %: 79 % — ABNORMAL HIGH (ref 43–77)
Platelets: 142 10*3/uL — ABNORMAL LOW (ref 150–400)
RBC: 3.93 MIL/uL (ref 3.87–5.11)
RDW: 14.6 % (ref 11.5–15.5)
WBC: 9.1 10*3/uL (ref 4.0–10.5)

## 2015-03-02 LAB — COMPREHENSIVE METABOLIC PANEL
ALT: 80 U/L — ABNORMAL HIGH (ref 14–54)
AST: 125 U/L — ABNORMAL HIGH (ref 15–41)
Albumin: 3.4 g/dL — ABNORMAL LOW (ref 3.5–5.0)
Alkaline Phosphatase: 226 U/L — ABNORMAL HIGH (ref 38–126)
Anion gap: 9 (ref 5–15)
BUN: 14 mg/dL (ref 6–20)
CO2: 27 mmol/L (ref 22–32)
Calcium: 8.8 mg/dL — ABNORMAL LOW (ref 8.9–10.3)
Chloride: 95 mmol/L — ABNORMAL LOW (ref 101–111)
Creatinine, Ser: 0.68 mg/dL (ref 0.44–1.00)
GFR calc Af Amer: >60 mL/min (ref >60)
GFR calc non Af Amer: >60 mL/min (ref >60)
Glucose, Bld: 127 mg/dL — ABNORMAL HIGH (ref 65–99)
Potassium: 4.4 mmol/L (ref 3.5–5.1)
Sodium: 131 mmol/L — ABNORMAL LOW (ref 135–145)
Total Bilirubin: 0.5 mg/dL (ref 0.3–1.2)
Total Protein: 6.8 g/dL (ref 6.5–8.1)

## 2015-03-02 LAB — PROTIME-INR
INR: 1.19 (ref 0.00–1.49)
Prothrombin Time: 15.3 seconds — ABNORMAL HIGH (ref 11.6–15.2)

## 2015-03-02 MED ORDER — NITROGLYCERIN 0.4 MG SL SUBL: 0.4000 mg | SUBLINGUAL_TABLET | SUBLINGUAL | Status: DC | PRN

## 2015-03-02 MED ORDER — AZELAIC ACID 15 % EX GEL: 1.0000 "application " | Freq: Every day | CUTANEOUS | Status: DC | PRN

## 2015-03-02 MED ORDER — ASPIRIN EC 81 MG PO TBEC: 81.0000 mg | DELAYED_RELEASE_TABLET | Freq: Every day | ORAL | Status: DC

## 2015-03-02 MED ORDER — OXYCODONE HCL 5 MG PO TABS
5.0000 mg | ORAL_TABLET | ORAL | Status: DC | PRN
  Administered 2015-03-02 – 2015-03-03 (×3): 5 mg via ORAL
  Filled 2015-03-02 (×3): qty 1

## 2015-03-02 MED ORDER — LACTASE 3000 UNITS PO TABS
1.0000 | ORAL_TABLET | Freq: Every day | ORAL | Status: DC | PRN
  Filled 2015-03-02: qty 1

## 2015-03-02 MED ORDER — ENSURE ENLIVE PO LIQD: 237.0000 mL | Freq: Two times a day (BID) | ORAL | Status: DC

## 2015-03-02 MED ORDER — SODIUM CHLORIDE 0.9 % IV SOLN: 250.0000 mL | INTRAVENOUS | Status: DC | PRN

## 2015-03-02 MED ORDER — CARBAMAZEPINE ER 400 MG PO TB12
400.0000 mg | ORAL_TABLET | Freq: Every day | ORAL | Status: DC
  Administered 2015-03-02 – 2015-03-08 (×7): 400 mg via ORAL
  Filled 2015-03-02 (×8): qty 1

## 2015-03-02 MED ORDER — ACETAMINOPHEN 325 MG PO TABS
650.0000 mg | ORAL_TABLET | ORAL | Status: DC | PRN
  Administered 2015-03-02 – 2015-03-08 (×6): 650 mg via ORAL
  Filled 2015-03-02 (×7): qty 2

## 2015-03-02 MED ORDER — FLUTICASONE PROPIONATE 50 MCG/ACT NA SUSP: 1.0000 | Freq: Every day | NASAL | Status: DC | PRN

## 2015-03-02 MED ORDER — LEVOFLOXACIN 500 MG PO TABS
500.0000 mg | ORAL_TABLET | Freq: Every day | ORAL | Status: DC
  Administered 2015-03-02 – 2015-03-06 (×5): 500 mg via ORAL
  Filled 2015-03-02 (×6): qty 1

## 2015-03-02 MED ORDER — ZOLPIDEM TARTRATE 5 MG PO TABS
5.0000 mg | ORAL_TABLET | Freq: Every evening | ORAL | Status: DC | PRN
  Administered 2015-03-04: 5 mg via ORAL
  Filled 2015-03-02: qty 1

## 2015-03-02 MED ORDER — TIROFIBAN HCL IV 12.5 MG/250 ML
0.0750 ug/kg/min | INTRAVENOUS | Status: DC
  Administered 2015-03-02 – 2015-03-06 (×3): 0.075 ug/kg/min via INTRAVENOUS
  Filled 2015-03-02 (×6): qty 250

## 2015-03-02 MED ORDER — SODIUM CHLORIDE 0.9 % IJ SOLN
3.0000 mL | Freq: Two times a day (BID) | INTRAMUSCULAR | Status: DC
  Administered 2015-03-02 – 2015-03-09 (×4): 3 mL via INTRAVENOUS

## 2015-03-02 MED ORDER — ONDANSETRON HCL 4 MG PO TABS: 8.0000 mg | ORAL_TABLET | Freq: Three times a day (TID) | ORAL | Status: DC | PRN

## 2015-03-02 MED ORDER — SODIUM CHLORIDE 0.9 % IJ SOLN
3.0000 mL | INTRAMUSCULAR | Status: DC | PRN
Start: 2015-03-02 — End: 2015-03-09
  Administered 2015-03-08: 3 mL via INTRAVENOUS
  Filled 2015-03-02: qty 3

## 2015-03-02 MED ORDER — ASPIRIN 81 MG PO CHEW
81.0000 mg | CHEWABLE_TABLET | Freq: Every day | ORAL | Status: DC
  Administered 2015-03-02 – 2015-03-05 (×4): 81 mg via ORAL
  Filled 2015-03-02 (×4): qty 1

## 2015-03-02 MED ORDER — ALPRAZOLAM 0.25 MG PO TABS: 0.2500 mg | ORAL_TABLET | Freq: Two times a day (BID) | ORAL | Status: DC | PRN

## 2015-03-02 MED ORDER — DEXAMETHASONE 2 MG PO TABS
2.0000 mg | ORAL_TABLET | Freq: Two times a day (BID) | ORAL | Status: DC
  Administered 2015-03-02 – 2015-03-09 (×15): 2 mg via ORAL
  Filled 2015-03-02 (×17): qty 1

## 2015-03-02 MED ORDER — METOPROLOL TARTRATE 12.5 MG HALF TABLET
12.5000 mg | ORAL_TABLET | Freq: Two times a day (BID) | ORAL | Status: DC
  Administered 2015-03-02 – 2015-03-09 (×14): 12.5 mg via ORAL
  Filled 2015-03-02 (×15): qty 1

## 2015-03-02 MED ORDER — ONDANSETRON HCL 4 MG/2ML IJ SOLN
4.0000 mg | Freq: Four times a day (QID) | INTRAMUSCULAR | Status: DC | PRN
  Administered 2015-03-04 – 2015-03-05 (×2): 4 mg via INTRAVENOUS
  Filled 2015-03-02 (×2): qty 2

## 2015-03-02 MED ORDER — PANCRELIPASE (LIP-PROT-AMYL) 12000-38000 UNITS PO CPEP
12000.0000 [IU] | ORAL_CAPSULE | Freq: Three times a day (TID) | ORAL | Status: DC
  Administered 2015-03-02 – 2015-03-09 (×21): 12000 [IU] via ORAL
  Filled 2015-03-02 (×23): qty 1

## 2015-03-02 MED ORDER — CARBAMAZEPINE ER 200 MG PO TB12
200.0000 mg | ORAL_TABLET | Freq: Every day | ORAL | Status: DC
  Administered 2015-03-03 – 2015-03-09 (×7): 200 mg via ORAL
  Filled 2015-03-02 (×7): qty 1

## 2015-03-02 MED ORDER — LISINOPRIL 5 MG PO TABS
5.0000 mg | ORAL_TABLET | Freq: Every day | ORAL | Status: DC
  Administered 2015-03-03 – 2015-03-05 (×3): 5 mg via ORAL
  Filled 2015-03-02 (×5): qty 1

## 2015-03-02 MED ORDER — ATORVASTATIN CALCIUM 80 MG PO TABS
80.0000 mg | ORAL_TABLET | Freq: Every day | ORAL | Status: DC
  Administered 2015-03-02 – 2015-03-09 (×9): 80 mg via ORAL
  Filled 2015-03-02 (×8): qty 1

## 2015-03-02 NOTE — Progress Notes (Signed)
ANTICOAGULATION CONSULT NOTE - Initial Consult  Pharmacy Consult for tirofiban Indication: recent DES off antiplatelet for biopsy  Allergies  Allergen Reactions  . Boniva [Ibandronic Acid]     DIARRHEA   . Delsym [Dextromethorphan] Diarrhea  . Erythromycin     INTERACTION WITH TEGRETOL AND SIDE EFFECTS   . Fosamax [Alendronate Sodium] Diarrhea  . Glucosamine Forte [Nutritional Supplements]     VARIOCELES IN MOUTH AND SIDE EFFECTS   . Guaifenesin & Derivatives     SLEEPY AND SIDE EFFECTS  . Lactose Intolerance (Gi) Diarrhea    Intolerance, gas  . Nexium [Esomeprazole Magnesium]     DIZZINESS  . Tegretol [Carbamazepine] Other (See Comments)    Can only take BRAND, no generic (causes dizziness, heightens side effects because it releases more of the drug, does not keep blood levels steady)  . Zostavax [Zoster Vaccine Live]     DIZZINESS, HEADACHES, DYSPHAGIA    Patient Measurements:   Dosing Weight: 51.5 kg  Vital Signs: Temp: 98.5 F (36.9 C) (07/20 1024) Temp Source: Oral (07/20 1024) BP: 120/61 mmHg (07/20 1024) Pulse Rate: 82 (07/20 1024)  Labs: No results for input(s): HGB, HCT, PLT, APTT, LABPROT, INR, HEPARINUNFRC, CREATININE, CKTOTAL, CKMB, TROPONINI in the last 72 hours.  Estimated Creatinine Clearance: 55.5 mL/min (by C-G formula based on Cr of 0.7).   Medical History: Past Medical History  Diagnosis Date  . Osteopenia   . Hypercholesterolemia   . Menopause   . Rosacea   . Lactose intolerance   . TMJ (dislocation of temporomandibular joint)   . Fatigue   . Rosacea   . TMJ (dislocation of temporomandibular joint)   . Mild aortic stenosis     echo 01/2015  . Coronary artery disease   . Family history of adverse reaction to anesthesia     "my mother gets PONV"  . GERD (gastroesophageal reflux disease)   . Epileptic seizure     "controlled; started w/my periods; last one was years ago" (02/07/2015)  . Sleep disorder   . Metastasis to liver with  unknown primary site 02/22/2015    Patient presented with upper abdominal pain CT scan of the abdomen on 02/15/2015 showed innumerable liver lesions concerning for metastatic malignancy. CA 19-9 levels On 02/21/2015 were noted to be 11,800 making pancreatic adenocarcinoma the likely primary site.     Medications:  Prescriptions prior to admission  Medication Sig Dispense Refill Last Dose  . AMBULATORY NON FORMULARY MEDICATION Take 90 mg by mouth 2 (two) times daily. Medication Name: Brilinta 90 mg BID provided by [employer] Research study (Do Not Fill)   03/01/2015 at Unknown time  . AMBULATORY NON FORMULARY MEDICATION Take 81 mg by mouth daily. Medication Name: ASA 81 mg daily Provided by [facility] study   03/02/2015 at Unknown time  . atorvastatin (LIPITOR) 80 MG tablet Take 1 tablet (80 mg total) by mouth daily at 6 PM. 30 tablet 11 03/01/2015 at Unknown time  . Azelaic Acid 15 % cream Apply 1 application topically daily as needed (for acne and rosacea).    03/01/2015 at Unknown time  . carbamazepine (TEGRETOL XR) 400 MG 12 hr tablet Take 400 mg by mouth at bedtime. Take along with the 200mg  XR tablet per patient   03/01/2015 at Unknown time  . carbamazepine (TEGRETOL XR) 400 MG 12 hr tablet Take 400 mg by mouth at bedtime.   03/01/2015 at Unknown time  . dexamethasone (DECADRON) 2 MG tablet Take 1 tablet (2 mg total) by  mouth 2 (two) times daily with a meal. 30 tablet 0 03/02/2015 at Unknown time  . lactase (LACTAID) 3000 UNITS tablet Take 1 tablet by mouth daily as needed (for lactose intolerance).   03/01/2015 at Unknown time  . levofloxacin (LEVAQUIN) 500 MG tablet Take 1 tablet (500 mg total) by mouth daily. (Patient taking differently: Take 500 mg by mouth daily at 6 PM. ) 5 tablet 0 03/01/2015 at Unknown time  . lipase/protease/amylase (CREON) 12000 UNITS CPEP capsule Take 1 capsule (12,000 Units total) by mouth 3 (three) times daily before meals. 270 capsule 1 03/02/2015 at Unknown time  . lisinopril  (PRINIVIL,ZESTRIL) 5 MG tablet Take 1 tablet (5 mg total) by mouth daily. 30 tablet 11 03/02/2015 at Unknown time  . metoprolol tartrate (LOPRESSOR) 25 MG tablet Take 0.5 tablets (12.5 mg total) by mouth 2 (two) times daily. 60 tablet 4 03/02/2015 at 0830  . Multiple Vitamins-Minerals (MULTIVITAMIN & MINERAL PO) Take 1 tablet by mouth at bedtime.    03/01/2015 at Unknown time  . ondansetron (ZOFRAN) 8 MG tablet Take 1 tablet (8 mg total) by mouth every 8 (eight) hours as needed for nausea or vomiting. 30 tablet 3 03/02/2015 at Unknown time  . oxyCODONE (OXY IR/ROXICODONE) 5 MG immediate release tablet Take 1 tablet (5 mg total) by mouth every 4 (four) hours as needed for severe pain. (Patient taking differently: Take 5 mg by mouth every 4 (four) hours as needed for severe pain (for cough). ) 60 tablet 0 03/02/2015 at Unknown time  . TEGRETOL-XR 200 MG 12 hr tablet Take 200 mg by mouth daily.    03/02/2015 at Unknown time  . acetaminophen (TYLENOL) 325 MG tablet Take 650 mg by mouth every 6 (six) hours as needed for mild pain.   3 months  . fluticasone (FLONASE) 50 MCG/ACT nasal spray Place 1 spray into both nostrils daily as needed for allergies.    02/27/2015  . zaleplon (SONATA) 5 MG capsule Take 5 mg by mouth at bedtime as needed for sleep.   2 weeks    Assessment: 69 yo female admitted 03/02/2015 for liver biopsy. Recently had [place] cath on 02/07/15, received DES x2 to LAD and x1 to RCA. On ASA, atorvastatin, and Brilinta PTA. Part of [employer] research study.  Pharmacy consulted to dose tirofiban.  Last dose of Brilinta yesterday, 7/19 at 2030. Holding Brilinta and starting tirofiban for liver biopsy. H/H and plt stable. Given renal function, wt, and PTA Brilinta use, will start tirofiban 0.075 mcg/kg/min (dose adjusted for renal functio and weight) CrCl 20ml/min  using actual body wt (51.5 kg).    Goal of Therapy:  Monitor platelets by anticoagulation protocol: Yes   Plan:  - Start tirofiban 0.075  mcg/kg/min - Monitor CBC daily  Wynelle Fanny 03/02/2015,2:03 PM    Discussed and agree with above  Bonnita Nasuti Pharm.D. CPP, BCPS Clinical Pharmacist [phone] 03/02/2015 2:30 PM

## 2015-03-02 NOTE — Telephone Encounter (Signed)
New Message   Pt calling in to speak to Rn, she is at [facility] and she is suppose to get orders for her iv  For her 5 days.

## 2015-03-02 NOTE — Telephone Encounter (Signed)
Spoke with Tennis Must at the hospital. She st everything has been taken care of.  Court Joy for her time.

## 2015-03-02 NOTE — H&P (Addendum)
Patient ID: KALESHA IRVING MRN: [id number], DOB/AGE: [birth year]   Admit date: 03/02/2015   Primary Physician: Shirline Frees, MD Primary Cardiologist: [name] Primary Oncologist: Dr Irene Limbo  HPI:  69 y/o female referred to [name] in early June 2016 with complaints of fatigue, DOE, and palpitations. The pt had risk factors for CAD and had PVD (moderate bilateral ICA disease). Work up done included and echo ( essentially normal) and a Myoview 02/02/15 that was positive for ischemia. She underwent cath and was found to have severe LAD and RCA disease. She underwent DES placement and was enrolled in the TWILIGHT study 02/07/15.   She also had problems with anorexia and was seen by [facility] and a CT done 02/15/15 showed widespread hepatic neoplastic disease. She was referred to Dr Irene Limbo with oncology. The pt is admitted now for US guided liver biopsy. This is scheduled for Monday 7/25- 10 am. After discussion with [name] and the interventional cardiologist it was decided to admit her now, for Brilinta washout and start Aggrastat. This will need to be stopped 2 hrs pre biopsy. She denies any cardiac complaints.    Problem List: Past Medical History  Diagnosis Date  . Osteopenia   . Hypercholesterolemia   . Menopause   . Rosacea   . Lactose intolerance   . TMJ (dislocation of temporomandibular joint)   . Fatigue   . Rosacea   . TMJ (dislocation of temporomandibular joint)   . Mild aortic stenosis     echo 01/2015  . Coronary artery disease   . Family history of adverse reaction to anesthesia     "my mother gets PONV"  . GERD (gastroesophageal reflux disease)   . Epileptic seizure     "controlled; started w/my periods; last one was years ago" (02/07/2015)  . Sleep disorder   . 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 CA  19-9 levels On 02/21/2015 were noted to be 11,800 making pancreatic adenocarcinoma the likely primary site.     Past Surgical History  Procedure Laterality Date  . Tonsillectomy    . Coronary angioplasty    . Ankle fracture surgery Left ~ 1977    "put 4 pins in"  . Hemangioma excision  ~ 2006    "inside my mouth"  . Hemangioma w/ laser excision  "yearly since ~ 2006"    "inside my mouth"  . Cardiac catheterization N/A 02/07/2015    Procedure: Left Heart Cath and Coronary Angiography;  Surgeon: Burnell Blanks, MD;  Location: [place] [facility];  Service: Cardiovascular;  Laterality: N/A;  . Cardiac catheterization N/A 02/07/2015    Procedure: Coronary Stent Intervention;  Surgeon: Burnell Blanks, MD;  Location: [place] [facility];  Service: Cardiovascular;  Laterality: N/A;     Allergies:  Allergies  Allergen Reactions  . Boniva [Ibandronic Acid]     DIARRHEA   . Delsym [Dextromethorphan] Diarrhea  . Erythromycin     INTERACTION WITH TEGRETOL AND SIDE EFFECTS   . Fosamax [Alendronate Sodium] Diarrhea  . Glucosamine Forte [Nutritional Supplements]     VARIOCELES IN MOUTH AND SIDE EFFECTS   . Guaifenesin & Derivatives     SLEEPY AND SIDE EFFECTS  . Lactose Intolerance (Gi) Diarrhea    Intolerance, gas  . Nexium [Esomeprazole Magnesium]     DIZZINESS  .  Tegretol [Carbamazepine] Other (See Comments)    Can only take BRAND, no generic (causes dizziness, heightens side effects because it releases more of the drug, does not keep blood levels steady)  . Zostavax [Zoster Vaccine Live]     DIZZINESS, HEADACHES, DYSPHAGIA     Home Medications Current Facility-Administered Medications  Medication Dose Route Frequency Provider Last Rate Last Dose  . feeding supplement (ENSURE ENLIVE) (ENSURE ENLIVE) liquid 237 mL  237 mL Oral BID BM Traci R Turner, MD      . tirofiban (AGGRASTAT) infusion 50 mcg/mL 250 mL  0.075 mcg/kg/min Intravenous Continuous [name], MD           Family History  Problem Relation Age of Onset  . Diabetes Mellitus I Mother   . Hypertension Mother   . Atrial fibrillation Mother   . Heart attack Father   . CVA Father   . CAD Father   . Hyperlipidemia Sister   . Heart attack Maternal Grandfather   . CVA Paternal Grandmother   . Colon cancer Paternal Grandfather      History   Social History  . Marital Status: Married    Spouse Name: gilbert  . Number of Children: 2  . Years of Education: college   Occupational History  . retired    Social History Main Topics  . Smoking status: Never Smoker   . Smokeless tobacco: Never Used  . Alcohol Use: No  . Drug Use: No  . Sexual Activity: Not on file   Other Topics Concern  . Not on file   Social History Narrative     Review of Systems: General: negative for chills, fever, night sweats   Cardiovascular: negative for chest pain, dyspnea on exertion, edema, orthopnea, palpitations, paroxysmal nocturnal dyspnea or shortness of breath Dermatological: negative for rash Respiratory: negative for cough or wheezing Urologic: negative for hematuria Abdominal: negative for vomiting, diarrhea, bright red blood per rectum, melena, or hematemesis Neurologic: negative for visual changes, syncope, or dizziness She complains of anorexia and wgt loss All other systems reviewed and are otherwise negative except as noted above.  Physical Exam: Blood pressure 120/61, pulse 82, temperature 98.5 F (36.9 C), temperature source Oral, SpO2 97 %.  General appearance: alert, cooperative, cachectic and no distress Neck: no JVD Lungs: clear to auscultation bilaterally Heart: regular rate and rhythm Abdomen: not distended, non tender Extremities: no edema Pulses: 2+ and symmetric Skin: Skin color, texture, turgor normal. No rashes or lesions Neurologic: Grossly normal   Labs:   Results for orders placed or performed during the hospital encounter of 03/01/15 (from the past 24  hour(s))  Influenza panel by PCR (type A & B, H1N1) (Not at [facility])     Status: None   Collection Time: 03/01/15  2:14 PM  Result Value Ref Range   Influenza A By PCR NEGATIVE NEGATIVE   Influenza B By PCR NEGATIVE NEGATIVE   H1N1 flu by pcr NOT DETECTED NOT DETECTED     Radiology/Studies: Dg Chest 2 View  02/18/2015   CLINICAL DATA:  Status post cardiac stent placement 1 month ago. It dyspnea on exertion and chest palpitations.  EXAM: CHEST  2 VIEW  COMPARISON:  None.  FINDINGS: Lungs are clear. Heart size is normal. No pneumothorax or pleural effusion. Coronary artery stent in the LAD is noted. No pneumothorax or pleural effusion.  IMPRESSION: No acute disease.   Electronically Signed   By: Inge Rise M.D.   On: 02/18/2015 11:40  Nm Pet Image Initial (pi) Skull Base To Thigh  02/28/2015   CLINICAL DATA:  Initial treatment strategy for hepatic metastatic disease of unknown origin.  EXAM: NUCLEAR MEDICINE PET SKULL BASE TO THIGH  TECHNIQUE: 5.96 mCi F-18 FDG was injected intravenously. Full-ring PET imaging was performed from the skull base to thigh after the radiotracer. CT data was obtained and used for attenuation correction and anatomic localization.  FASTING BLOOD GLUCOSE:  Value: 111 mg/dl  COMPARISON:  Abdominal CT 02/15/2015.  FINDINGS: NECK  No hypermetabolic cervical lymph nodes are identified.There are no lesions of the pharyngeal mucosal space. Dense right thyroid calcification noted.  CHEST  There is minimal hypermetabolic activity in the left hilar and subcarinal regions without apparent corresponding enlarged lymph nodes. This activity has an SUV max of 3.7 and 4.1, respectively. There is also mildly increased activity in the lower right parasternal region with an SUV max of 3.3. This may correspond with a pleural lesion or internal mammary lymph node, although no discretely enlarged lymph node identified. There is no suspicious pulmonary activity.  ABDOMEN/PELVIS  As demonstrated on  CT, there is widespread hypermetabolic tumor throughout the liver involving all segments. There are confluent hypermetabolic components within the left lobe (SUV max 13.4) and posteriorly in the right lobe (SUV max 10.5). There are hypermetabolic lymph nodes within the upper abdomen involving the porta hepatis and retroperitoneum (SUV max 7.0). No abnormal metabolic activity seen within the spleen, adrenal glands or pancreas. No bowel lesions identified. There is no pelvic adenopathy.  SKELETON  There is no hypermetabolic activity to suggest osseous metastatic disease. There is no abnormal activity within the breasts.  IMPRESSION: 1. Widespread neoplastic disease throughout the liver and lymph nodes of the upper abdomen. 2. No definite extrahepatic primary malignancy identified. 3. Potential early nodal metastases within the right internal mammary, subcarinal and left hilar stations. 4. Tissue sampling recommended.   Electronically Signed   By: Richardean Sale M.D.   On: 02/28/2015 10:54   Ct Abd Wo & W Cm  02/15/2015   CLINICAL DATA:  Upper abdominal pain with burning sensation down, weight loss and decreased appetite. Abnormal outside ultrasound. Evaluate for liver mass. Initial encounter.  EXAM: CT ABDOMEN WITHOUT AND WITH CONTRAST  TECHNIQUE: Multidetector CT imaging of the abdomen was performed following the standard protocol before and following the bolus administration of intravenous contrast.  CONTRAST:  100 ml Omnipaque 300.  COMPARISON:  None.  FINDINGS: Lower chest: Clear lung bases. No significant pleural or pericardial effusion. Coronary artery calcifications are noted.  Hepatobiliary: There is widespread hepatic neoplastic disease with multiple peripherally hypervascular lesions. Confluent tumor superiorly in the left hepatic lobe measures up to 7.2 cm transverse on image 23. There is also confluent tumor posteriorly in the right hepatic lobe. A peripherally enhancing lesion lateral to the  gallbladder is best seen on the delayed images (series 12), measuring 4.4 x 3.1 cm on image number 25. No underlying morphologic changes of cirrhosis identified. No evidence of gallbladder wall thickening, gallstones or biliary dilatation.  Pancreas: Unremarkable. No pancreatic ductal dilatation or surrounding inflammatory changes.  Spleen: Normal in size without focal abnormality.  Adrenals/Urinary Tract: Both adrenal glands appear normal.The kidneys appear normal without evidence of urinary tract calculus or hydronephrosis. Bladder not imaged.  Stomach/Bowel: No evidence of bowel wall thickening, distention or surrounding inflammatory change.  Vascular/Lymphatic: There are enlarged lymph nodes within the porta hepatis, measuring 13 mm on images 36 and 38 of series 11. There is  an aortocaval node measuring 10 mm on image 46. Mild aortoiliac atherosclerosis noted.  Other: No ascites or peritoneal nodularity identified.  Musculoskeletal: No acute or significant osseous findings.  IMPRESSION: 1. Widespread hepatic metastatic disease. No underlying morphologic changes of cirrhosis are demonstrated to suggest multifocal hepatocellular carcinoma. 2. No primary malignancy identified within the abdomen. The pelvis was not imaged. 3. Probable metastatic adenopathy within the porta hepatis and upper retroperitoneum. 4. The hepatic disease should be amenable to percutaneous tissue sampling. These results will be called to the ordering clinician or representative by the Radiologist Assistant, and communication documented in the PACS or zVision Dashboard.   Electronically Signed   By: Richardean Sale M.D.   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 Liver biopsy Monday 7/25. Resume Brilinta ASAP post biopsy. Will discuss TWILIGHT STUDY drug with MD.    Henri Medal, PA-C 03/02/2015, 2:08 PM [phone]  As above, patient seen and examined. Briefly she is a 69 year old female with recent PCI admitted for biopsy of liver mass. The patient had PCI of the LAD with 2 drug-eluting stents and the right coronary artery with 1 drug-eluting stent on 02/07/2015. She has subsequent been found to have liver mass with elevated CA 19-9 suggestive of pancreatic origin. She will be admitted to discontinue brilinta and be treated with aggrastat in anticipation of liver bx Monday 7/25; continue ASA; resume brilinta following procedure. I have notified TWILIGHT study coordinator to see. Kirk Ruths

## 2015-03-03 DIAGNOSIS — C228 Malignant neoplasm of liver, primary, unspecified as to type: Secondary | ICD-10-CM

## 2015-03-03 DIAGNOSIS — E785 Hyperlipidemia, unspecified: Secondary | ICD-10-CM

## 2015-03-03 DIAGNOSIS — E46 Unspecified protein-calorie malnutrition: Secondary | ICD-10-CM

## 2015-03-03 DIAGNOSIS — I10 Essential (primary) hypertension: Secondary | ICD-10-CM

## 2015-03-03 LAB — CBC
HCT: 35.1 % — ABNORMAL LOW (ref 36.0–46.0)
Hemoglobin: 11.9 g/dL — ABNORMAL LOW (ref 12.0–15.0)
MCH: 31.5 pg (ref 26.0–34.0)
MCHC: 33.9 g/dL (ref 30.0–36.0)
MCV: 92.9 fL (ref 78.0–100.0)
PLATELETS: 143 10*3/uL — AB (ref 150–400)
RBC: 3.78 MIL/uL — ABNORMAL LOW (ref 3.87–5.11)
RDW: 14.8 % (ref 11.5–15.5)
WBC: 9.7 10*3/uL (ref 4.0–10.5)

## 2015-03-03 MED ORDER — BOOST / RESOURCE BREEZE PO LIQD
1.0000 | ORAL | Status: DC
  Administered 2015-03-03 – 2015-03-06 (×2): 1 via ORAL

## 2015-03-03 NOTE — Progress Notes (Signed)
MD Balfour paged regarding pt temp 102 after tylenol given.  No new orders at this time.  Will continue to monitor pt closely. Claudette Stapler, RN

## 2015-03-03 NOTE — Progress Notes (Signed)
DAILY PROGRESS NOTE  Subjective:  No events overnight. Fevers have gone away, cough is improving. Admitted for aggrestat given recent DES and need to hold antiplatelets for upcoming liver biopsy on Monday.  Objective:  Temp:  [98.4 F (36.9 C)-102.1 F (38.9 C)] 98.4 F (36.9 C) (07/21 0621) Pulse Rate:  [72-92] 72 (07/21 0621) Resp:  [18] 18 (07/21 0621) BP: (119-151)/(65-81) 119/65 mmHg (07/21 0621) SpO2:  [98 %] 98 % (07/21 0621) Weight:  [113 lb 9.6 oz (51.529 kg)] 113 lb 9.6 oz (51.529 kg) (07/21 1200) Weight change:   Intake/Output from previous day: 07/20 0701 - 07/21 0700 In: -  Out: 800 [Urine:800]  Intake/Output from this shift: Total I/O In: 240 [P.O.:240] Out: -   Medications: Current Facility-Administered Medications  Medication Dose Route Frequency Provider Last Rate Last Dose  . 0.9 %  sodium chloride infusion  250 mL Intravenous PRN Abelino Derrick, PA-C      . acetaminophen (TYLENOL) tablet 650 mg  650 mg Oral Q4H PRN Abelino Derrick, PA-C   650 mg at 03/03/15 0401  . ALPRAZolam Prudy Feeler) tablet 0.25 mg  0.25 mg Oral BID PRN Abelino Derrick, PA-C      . aspirin chewable tablet 81 mg  81 mg Oral Daily Lewayne Bunting, MD   81 mg at 03/03/15 1001  . atorvastatin (LIPITOR) tablet 80 mg  80 mg Oral [address] [place], PA-C   80 mg at 03/02/15 2005  . Azelaic Acid 15 % 1 application  1 application Topical Daily PRN Abelino Derrick, PA-C      . carbamazepine (TEGRETOL XR) 12 hr tablet 200 mg  200 mg Oral Daily Abelino Derrick, PA-C   200 mg at 03/03/15 0956  . carbamazepine (TEGRETOL XR) 12 hr tablet 400 mg  400 mg Oral QHS Abelino Derrick, PA-C   400 mg at 03/02/15 2142  . dexamethasone (DECADRON) tablet 2 mg  2 mg Oral BID WC Abelino Derrick, PA-C   2 mg at 03/03/15 0818  . feeding supplement (BOOST / RESOURCE BREEZE) liquid 1 Container  1 Container Oral Q24H Idell Pickles, RD      . fluticasone (FLONASE) 50 MCG/ACT nasal spray 1 spray  1 spray Each Nare Daily PRN  Abelino Derrick, PA-C      . lactase (LACTAID) tablet 3,000 Units  1 tablet Oral Daily PRN Abelino Derrick, PA-C      . levofloxacin [facility]) tablet 500 mg  500 mg Oral q1800 Eda Paschal Kilroy, PA-C   500 mg at 03/02/15 1748  . lipase/protease/amylase (CREON) capsule 12,000 Units  12,000 Units Oral TID AC Luke K Kilroy, PA-C   12,000 Units at 03/03/15 1150  . lisinopril (PRINIVIL,ZESTRIL) tablet 5 mg  5 mg Oral Daily Eda Paschal Kunkle, PA-C   5 mg at 03/03/15 0956  . metoprolol tartrate (LOPRESSOR) tablet 12.5 mg  12.5 mg Oral BID Abelino Derrick, PA-C   12.5 mg at 03/03/15 0957  . nitroGLYCERIN (NITROSTAT) SL tablet 0.4 mg  0.4 mg Sublingual Q5 Min x 3 PRN Abelino Derrick, PA-C      . ondansetron [facility]) injection 4 mg  4 mg Intravenous Q6H PRN Abelino Derrick, PA-C      . oxyCODONE (Oxy IR/ROXICODONE) immediate release tablet 5 mg  5 mg Oral Q4H PRN Abelino Derrick, PA-C   5 mg at 03/03/15 0818  . sodium chloride 0.9 % injection 3 mL  3 mL Intravenous Q12H [name], PA-C   3 mL at 03/03/15 0957  . sodium chloride 0.9 % injection 3 mL  3 mL Intravenous PRN [name], PA-C      . tirofiban (AGGRASTAT) infusion 50 mcg/mL 250 mL  0.075 mcg/kg/min Intravenous Continuous [name], MD 4.6 mL/hr at 03/02/15 1534 0.075 mcg/kg/min at 03/02/15 1534  . zolpidem (AMBIEN) tablet 5 mg  5 mg Oral QHS PRN [name], PA-C        Physical Exam: General appearance: alert and no distress Lungs: diminished breath sounds bibasilar Heart: regular rate and rhythm, S1, S2 normal, no murmur, click, rub or gallop Extremities: extremities normal, atraumatic, no cyanosis or edema Neurologic: Grossly normal  Lab Results: Results for orders placed or performed during the hospital encounter of 03/02/15 (from the past 48 hour(s))  Comprehensive metabolic panel     Status: Abnormal   Collection Time: 03/02/15  3:13 PM  Result Value Ref Range   Sodium 131 (L) 135 - 145 mmol/L   Potassium 4.4 3.5 - 5.1 mmol/L   Chloride  95 (L) 101 - 111 mmol/L   CO2 27 22 - 32 mmol/L   Glucose, Bld 127 (H) 65 - 99 mg/dL   BUN 14 6 - 20 mg/dL   Creatinine, Ser 0.68 0.44 - 1.00 mg/dL   Calcium 8.8 (L) 8.9 - 10.3 mg/dL   Total Protein 6.8 6.5 - 8.1 g/dL   Albumin 3.4 (L) 3.5 - 5.0 g/dL   AST 125 (H) 15 - 41 U/L   ALT 80 (H) 14 - 54 U/L   Alkaline Phosphatase 226 (H) 38 - 126 U/L   Total Bilirubin 0.5 0.3 - 1.2 mg/dL   GFR calc non Af Amer >60 >60 mL/min   GFR calc Af Amer >60 >60 mL/min    Comment: (NOTE) The eGFR has been calculated using the CKD EPI equation. This calculation has not been validated in all clinical situations. eGFR's persistently <60 mL/min signify possible Chronic Kidney Disease.    Anion gap 9 5 - 15  Protime-INR     Status: Abnormal   Collection Time: 03/02/15  3:13 PM  Result Value Ref Range   Prothrombin Time 15.3 (H) 11.6 - 15.2 seconds   INR 1.19 0.00 - 1.49  CBC WITH DIFFERENTIAL     Status: Abnormal   Collection Time: 03/02/15  3:13 PM  Result Value Ref Range   WBC 9.1 4.0 - 10.5 K/uL   RBC 3.93 3.87 - 5.11 MIL/uL   Hemoglobin 12.5 12.0 - 15.0 g/dL   HCT 36.5 36.0 - 46.0 %   MCV 92.9 78.0 - 100.0 fL   MCH 31.8 26.0 - 34.0 pg   MCHC 34.2 30.0 - 36.0 g/dL   RDW 14.6 11.5 - 15.5 %   Platelets 142 (L) 150 - 400 K/uL   Neutrophils Relative % 79 (H) 43 - 77 %   Neutro Abs 7.1 1.7 - 7.7 K/uL   Lymphocytes Relative 9 (L) 12 - 46 %   Lymphs Abs 0.9 0.7 - 4.0 K/uL   Monocytes Relative 7 3 - 12 %   Monocytes Absolute 0.7 0.1 - 1.0 K/uL   Eosinophils Relative 5 0 - 5 %   Eosinophils Absolute 0.4 0.0 - 0.7 K/uL   Basophils Relative 0 0 - 1 %   Basophils Absolute 0.0 0.0 - 0.1 K/uL  CBC     Status: Abnormal   Collection Time: 03/02/15  8:48  PM  Result Value Ref Range   WBC 8.6 4.0 - 10.5 K/uL   RBC 3.74 (L) 3.87 - 5.11 MIL/uL   Hemoglobin 11.9 (L) 12.0 - 15.0 g/dL   HCT 34.5 (L) 36.0 - 46.0 %   MCV 92.2 78.0 - 100.0 fL   MCH 31.8 26.0 - 34.0 pg   MCHC 34.5 30.0 - 36.0 g/dL   RDW  14.6 11.5 - 15.5 %   Platelets 124 (L) 150 - 400 K/uL  CBC     Status: Abnormal   Collection Time: 03/03/15 10:23 AM  Result Value Ref Range   WBC 9.7 4.0 - 10.5 K/uL   RBC 3.78 (L) 3.87 - 5.11 MIL/uL   Hemoglobin 11.9 (L) 12.0 - 15.0 g/dL   HCT 35.1 (L) 36.0 - 46.0 %   MCV 92.9 78.0 - 100.0 fL   MCH 31.5 26.0 - 34.0 pg   MCHC 33.9 30.0 - 36.0 g/dL   RDW 14.8 11.5 - 15.5 %   Platelets 143 (L) 150 - 400 K/uL    Imaging: No results found.  Assessment:  1. Principal Problem: 2.   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 Active Problems: 4.   Dyslipidemia 5.   Essential hypertension 6.   CAD S/P RCA, LAD DES-02/07/15 7.   Protein calorie malnutrition 8.   Liver cancer 9.   Plan:  1. Continue aggrestat for biopsy Monday. Labs stable, white blood cell count declining. Continue levofloxicin. Sodium and chloride are trending down, but she is taking more po today - watch for need for IVF's.  Time Spent Directly with Patient:  15 minutes  Length of Stay:  LOS: 1 day   Pixie Casino, MD, [facility] Attending Cardiologist [place] 03/03/2015, 1:30 PM

## 2015-03-03 NOTE — Progress Notes (Signed)
ANTICOAGULATION CONSULT NOTE - Follow Up Consult  Pharmacy Consult for Tirofiban Indication: recent DES off antiplatelet for biopsy  Patient Measurements: Height: 5\' 3"  (160 cm) Weight: 113 lb 9.6 oz (51.529 kg) IBW/kg (Calculated) : 52.4 Tirofiban Dosing Weight: 51.5 kg  Vital Signs: Temp: 98.4 F (36.9 C) (07/21 0621) Temp Source: Oral (07/21 0621) BP: 119/65 mmHg (07/21 0621) Pulse Rate: 72 (07/21 0621)  Labs:  Recent Labs  03/02/15 1513 03/02/15 2048 03/03/15 1023  HGB 12.5 11.9* 11.9*  HCT 36.5 34.5* 35.1*  PLT 142* 124* 143*  LABPROT 15.3*  --   --   INR 1.19  --   --   CREATININE 0.68  --   --     Estimated Creatinine Clearance: 55.5 mL/min (by C-G formula based on Cr of 0.68).  Assessment:  Off Brilinta for liver biopsy on 03/07/15.  On ASA 81 mg daily as prior to admission.  CBC is stable on Tirofiban at 0.075 mcg/kg/min for crcl < 60 ml/min.  Platelet count just below normal range.  Renal function stable.  Transaminases up some on admit (AST 125/ALT 80). On Lipitor 80 mg daily as prior to admission.  Goal of Therapy:  platelet count >70-75K, ~ 50% of baseline Monitor platelets by anticoagulation protocol: Yes   Plan:   Continue Tirofiban at 0.075 mcg/kg/min.  Daily CBC while on Tirofiban.  Will need stop time defined for holding Tirofiban prior to liver biopsy on 7/25. Half-life ~ 2 hours. Consider holding > 2 hrs prior to procedure.  Follow renal function for any need to adjust dose.   Follow up LFTs when repeated; on high dose Atorvastatin.  Arty Baumgartner, [place] Pager: [phone] 03/03/2015,12:39 PM

## 2015-03-03 NOTE — Progress Notes (Signed)
Utilization review completed.  

## 2015-03-03 NOTE — Progress Notes (Signed)
Initial Nutrition Assessment  DOCUMENTATION CODES:   Severe malnutrition in context of chronic illness  INTERVENTION:    Boost Breeze po once daily at bedtime, each supplement provides 250 kcal and 9 grams of protein  NUTRITION DIAGNOSIS:   Malnutrition related to chronic illness as evidenced by severe depletion of muscle mass, percent weight loss (7% weight loss within 1 month).  GOAL:   Patient will meet greater than or equal to 90% of their needs  MONITOR:   PO intake, Supplement acceptance, Weight trends, Labs  REASON FOR ASSESSMENT:   Malnutrition Screening Tool    ASSESSMENT:   Patient admitted for biopsy of liver mass, scheduled for 7/25. CT scan on 02/15/15 showed widespread hepatic neoplastic disease.    Patient reports recent poor intake for the past few months due to no appetite related to newly diagnosed pancreatic cancer. She has lost 7% of usual weight over the past month, 11% weight loss overall. She was offered an Ensure supplement this AM, but was too full from breakfast to drink it. She wants to try Boost Breeze at bedtime. Labs reviewed. Nutrition-Focused physical exam completed. Findings are no fat depletion, severe muscle depletion, and no edema.   Diet Order:  Diet regular Room service appropriate?: Yes; Fluid consistency:: Thin  Skin:  Reviewed, no issues  Last BM:  7/19  Height:   Ht Readings from Last 1 Encounters:  03/03/15 5\' 3"  (1.6 m)    Weight:   Wt Readings from Last 1 Encounters:  03/03/15 113 lb 9.6 oz (51.529 kg)    Ideal Body Weight:  52.3 kg  Wt Readings from Last 10 Encounters:  03/03/15 113 lb 9.6 oz (51.529 kg)  03/01/15 113 lb 9.6 oz (51.529 kg)  02/21/15 117 lb 11.2 oz (53.388 kg)  02/16/15 116 lb 9.6 oz (52.889 kg)  02/08/15 117 lb 1 oz (53.1 kg)  02/02/15 122 lb (55.339 kg)  01/12/15 122 lb (55.339 kg)    BMI:  20.1  Estimated Nutritional Needs:   Kcal:  1600-1800  Protein:  80-95 gm  Fluid:  1.8  L  EDUCATION NEEDS:   Education needs addressed   Molli Barrows, [place], LDN, [place] Pager [phone] After Hours Pager [phone]

## 2015-03-04 ENCOUNTER — Ambulatory Visit (HOSPITAL_COMMUNITY): Payer: Medicare PPO

## 2015-03-04 ENCOUNTER — Other Ambulatory Visit: Payer: Self-pay | Admitting: Radiology

## 2015-03-04 DIAGNOSIS — C227 Other specified carcinomas of liver: Secondary | ICD-10-CM

## 2015-03-04 LAB — CBC
HCT: 32.9 % — ABNORMAL LOW (ref 36.0–46.0)
Hemoglobin: 11.3 g/dL — ABNORMAL LOW (ref 12.0–15.0)
MCH: 31.5 pg (ref 26.0–34.0)
MCHC: 34.3 g/dL (ref 30.0–36.0)
MCV: 91.6 fL (ref 78.0–100.0)
PLATELETS: 142 10*3/uL — AB (ref 150–400)
RBC: 3.59 MIL/uL — AB (ref 3.87–5.11)
RDW: 14.7 % (ref 11.5–15.5)
WBC: 9.4 10*3/uL (ref 4.0–10.5)

## 2015-03-04 NOTE — Progress Notes (Signed)
DAILY PROGRESS NOTE  Subjective:  No events overnight. Remains on aggrestat. Labs stable.  Objective:  Temp:  [98.4 F (36.9 C)-98.7 F (37.1 C)] 98.4 F (36.9 C) (07/22 0420) Pulse Rate:  [70-79] 70 (07/22 0420) Resp:  [18] 18 (07/22 0420) BP: (127-155)/(56-69) 154/62 mmHg (07/22 0420) SpO2:  [99 %-100 %] 100 % (07/22 0420) Weight:  [113 lb 9.6 oz (51.529 kg)] 113 lb 9.6 oz (51.529 kg) (07/21 1200) Weight change:   Intake/Output from previous day: 07/21 0701 - 07/22 0700 In: 600 [P.O.:600] Out: -   Intake/Output from this shift:    Medications: Current Facility-Administered Medications  Medication Dose Route Frequency Provider Last Rate Last Dose  . 0.9 %  sodium chloride infusion  250 mL Intravenous PRN [name], PA-C      . acetaminophen (TYLENOL) tablet 650 mg  650 mg Oral Q4H PRN [name], PA-C   650 mg at 03/03/15 2018  . ALPRAZolam [name]) tablet 0.25 mg  0.25 mg Oral BID PRN [name], PA-C      . aspirin chewable tablet 81 mg  81 mg Oral Daily [name], MD   81 mg at 03/03/15 1001  . atorvastatin (LIPITOR) tablet 80 mg  80 mg Oral [address] [place], PA-C   80 mg at 03/03/15 1812  . Azelaic Acid 15 % 1 application  1 application Topical Daily PRN [name], PA-C      . carbamazepine (TEGRETOL XR) 12 hr tablet 200 mg  200 mg Oral Daily [name], PA-C   200 mg at 03/03/15 0956  . carbamazepine (TEGRETOL XR) 12 hr tablet 400 mg  400 mg Oral QHS [name], PA-C   400 mg at 03/03/15 2123  . dexamethasone (DECADRON) tablet 2 mg  2 mg Oral BID WC [name], PA-C   2 mg at 03/04/15 5284  . feeding supplement (BOOST / RESOURCE BREEZE) liquid 1 Container  [place], RD   1 Container at 03/03/15 2018  . fluticasone (FLONASE) 50 MCG/ACT nasal spray 1 spray  1 spray Each Nare Daily PRN [name], PA-C      . lactase (LACTAID) tablet 3,000 Units  1 tablet Oral Daily PRN [name], PA-C      .  levofloxacin [facility]) tablet 500 mg  500 mg Oral q1800 [name], PA-C   500 mg at 03/03/15 1707  . lipase/protease/amylase (CREON) capsule 12,000 Units  12,000 Units Oral TID [facility] [name], PA-C   12,000 Units at 03/04/15 [phone]  . lisinopril (PRINIVIL,ZESTRIL) tablet 5 mg  5 mg Oral Daily Doreene Burke Viola, PA-C   5 mg at 03/03/15 0956  . metoprolol tartrate (LOPRESSOR) tablet 12.5 mg  12.5 mg Oral BID [name], PA-C   12.5 mg at 03/03/15 2123  . nitroGLYCERIN (NITROSTAT) SL tablet 0.4 mg  0.4 mg Sublingual Q5 Min x 3 PRN [name], PA-C      . ondansetron [facility]) injection 4 mg  4 mg Intravenous Q6H PRN [name], PA-C      . oxyCODONE (Oxy IR/ROXICODONE) immediate release tablet 5 mg  5 mg Oral Q4H PRN [name], PA-C   5 mg at 03/03/15 0818  . sodium chloride 0.9 % injection 3 mL  3 mL Intravenous Q12H [name], PA-C   3 mL at 03/03/15 0957  . sodium chloride 0.9 %  injection 3 mL  3 mL Intravenous PRN [name], PA-C      . tirofiban (AGGRASTAT) infusion 50 mcg/mL 250 mL  0.075 mcg/kg/min Intravenous Continuous [name], MD 4.6 mL/hr at 03/02/15 1534 0.075 mcg/kg/min at 03/02/15 1534  . zolpidem (AMBIEN) tablet 5 mg  5 mg Oral QHS PRN [name], PA-C        Physical Exam: General appearance: alert and no distress Lungs: diminished breath sounds bibasilar Heart: regular rate and rhythm, S1, S2 normal, no murmur, click, rub or gallop Extremities: extremities normal, atraumatic, no cyanosis or edema Neurologic: Grossly normal  Lab Results: Results for orders placed or performed during the hospital encounter of 03/02/15 (from the past 48 hour(s))  Comprehensive metabolic panel     Status: Abnormal   Collection Time: 03/02/15  3:13 PM  Result Value Ref Range   Sodium 131 (L) 135 - 145 mmol/L   Potassium 4.4 3.5 - 5.1 mmol/L   Chloride 95 (L) 101 - 111 mmol/L   CO2 27 22 - 32 mmol/L   Glucose, Bld 127 (H) 65 - 99 mg/dL   BUN 14 6 - 20 mg/dL    Creatinine, Ser 0.68 0.44 - 1.00 mg/dL   Calcium 8.8 (L) 8.9 - 10.3 mg/dL   Total Protein 6.8 6.5 - 8.1 g/dL   Albumin 3.4 (L) 3.5 - 5.0 g/dL   AST 125 (H) 15 - 41 U/L   ALT 80 (H) 14 - 54 U/L   Alkaline Phosphatase 226 (H) 38 - 126 U/L   Total Bilirubin 0.5 0.3 - 1.2 mg/dL   GFR calc non Af Amer >60 >60 mL/min   GFR calc Af Amer >60 >60 mL/min    Comment: (NOTE) The eGFR has been calculated using the CKD EPI equation. This calculation has not been validated in all clinical situations. eGFR's persistently <60 mL/min signify possible Chronic Kidney Disease.    Anion gap 9 5 - 15  Protime-INR     Status: Abnormal   Collection Time: 03/02/15  3:13 PM  Result Value Ref Range   Prothrombin Time 15.3 (H) 11.6 - 15.2 seconds   INR 1.19 0.00 - 1.49  CBC WITH DIFFERENTIAL     Status: Abnormal   Collection Time: 03/02/15  3:13 PM  Result Value Ref Range   WBC 9.1 4.0 - 10.5 K/uL   RBC 3.93 3.87 - 5.11 MIL/uL   Hemoglobin 12.5 12.0 - 15.0 g/dL   HCT 36.5 36.0 - 46.0 %   MCV 92.9 78.0 - 100.0 fL   MCH 31.8 26.0 - 34.0 pg   MCHC 34.2 30.0 - 36.0 g/dL   RDW 14.6 11.5 - 15.5 %   Platelets 142 (L) 150 - 400 K/uL   Neutrophils Relative % 79 (H) 43 - 77 %   Neutro Abs 7.1 1.7 - 7.7 K/uL   Lymphocytes Relative 9 (L) 12 - 46 %   Lymphs Abs 0.9 0.7 - 4.0 K/uL   Monocytes Relative 7 3 - 12 %   Monocytes Absolute 0.7 0.1 - 1.0 K/uL   Eosinophils Relative 5 0 - 5 %   Eosinophils Absolute 0.4 0.0 - 0.7 K/uL   Basophils Relative 0 0 - 1 %   Basophils Absolute 0.0 0.0 - 0.1 K/uL  CBC     Status: Abnormal   Collection Time: 03/02/15  8:48 PM  Result Value Ref Range   WBC 8.6 4.0 - 10.5 K/uL   RBC 3.74 (L) 3.87 -  5.11 MIL/uL   Hemoglobin 11.9 (L) 12.0 - 15.0 g/dL   HCT 34.5 (L) 36.0 - 46.0 %   MCV 92.2 78.0 - 100.0 fL   MCH 31.8 26.0 - 34.0 pg   MCHC 34.5 30.0 - 36.0 g/dL   RDW 14.6 11.5 - 15.5 %   Platelets 124 (L) 150 - 400 K/uL  CBC     Status: Abnormal   Collection Time: 03/03/15  10:23 AM  Result Value Ref Range   WBC 9.7 4.0 - 10.5 K/uL   RBC 3.78 (L) 3.87 - 5.11 MIL/uL   Hemoglobin 11.9 (L) 12.0 - 15.0 g/dL   HCT 35.1 (L) 36.0 - 46.0 %   MCV 92.9 78.0 - 100.0 fL   MCH 31.5 26.0 - 34.0 pg   MCHC 33.9 30.0 - 36.0 g/dL   RDW 14.8 11.5 - 15.5 %   Platelets 143 (L) 150 - 400 K/uL  CBC     Status: Abnormal   Collection Time: 03/04/15  4:16 AM  Result Value Ref Range   WBC 9.4 4.0 - 10.5 K/uL   RBC 3.59 (L) 3.87 - 5.11 MIL/uL   Hemoglobin 11.3 (L) 12.0 - 15.0 g/dL   HCT 32.9 (L) 36.0 - 46.0 %   MCV 91.6 78.0 - 100.0 fL   MCH 31.5 26.0 - 34.0 pg   MCHC 34.3 30.0 - 36.0 g/dL   RDW 14.7 11.5 - 15.5 %   Platelets 142 (L) 150 - 400 K/uL    Imaging: No results found.  Assessment:  Principal Problem:   Metastasis to liver with unknown primary site Active Problems:   Dyslipidemia   Essential hypertension   CAD S/P RCA, LAD DES-02/07/15   Protein calorie malnutrition   Liver cancer   Plan:  Continue aggrestat for biopsy Monday. Labs stable. Continue levofloxicin. Check repeat BMET tomorrow.  Time Spent Directly with Patient:  15 minutes  Length of Stay:  LOS: 2 days   Pixie Casino, MD, [facility] Attending Cardiologist [place] 03/04/2015, 8:34 AM

## 2015-03-04 NOTE — Progress Notes (Signed)
ANTICOAGULATION CONSULT NOTE - Follow Up Consult  Pharmacy Consult for tirofiban Indication: recent DES off antiplatelet for biopsy  Allergies  Allergen Reactions  . Boniva [Ibandronic Acid]     DIARRHEA   . Delsym [Dextromethorphan] Diarrhea  . Erythromycin     INTERACTION WITH TEGRETOL AND SIDE EFFECTS   . Fosamax [Alendronate Sodium] Diarrhea  . Glucosamine Forte [Nutritional Supplements]     VARIOCELES IN MOUTH AND SIDE EFFECTS   . Guaifenesin & Derivatives     SLEEPY AND SIDE EFFECTS  . Lactose Intolerance (Gi) Diarrhea    Intolerance, gas  . Nexium [Esomeprazole Magnesium]     DIZZINESS  . Tegretol [Carbamazepine] Other (See Comments)    Can only take BRAND, no generic (causes dizziness, heightens side effects because it releases more of the drug, does not keep blood levels steady)  . Zostavax [Zoster Vaccine Live]     DIZZINESS, HEADACHES, DYSPHAGIA    Patient Measurements: Height: 5\' 3"  (160 cm) Weight: 113 lb 9.6 oz (51.529 kg) IBW/kg (Calculated) : 52.4  Vital Signs: Temp: 98 F (36.7 C) (07/22 1016) Temp Source: Oral (07/22 1016) BP: 130/58 mmHg (07/22 1016) Pulse Rate: 83 (07/22 1016)  Labs:  Recent Labs  03/02/15 1513 03/02/15 2048 03/03/15 1023 03/04/15 0416  HGB 12.5 11.9* 11.9* 11.3*  HCT 36.5 34.5* 35.1* 32.9*  PLT 142* 124* 143* 142*  LABPROT 15.3*  --   --   --   INR 1.19  --   --   --   CREATININE 0.68  --   --   --     Estimated Creatinine Clearance: 55.5 mL/min (by C-G formula based on Cr of 0.68).   Assessment:  69 y/o F off Brilinta for liver biopsy on 07/25. ASA 81 mg PTA. CBC is stable. Dose of tirofiban appropriate for CrCl. Plts and renal function stable.   Goal of Therapy:  Plt count > 70-70K; ~50% baseline Monitor platelets by anticoagulation protocol: Yes   Plan:  Continue Tirofiban at 0.075 mcg/kg/min. Daily CBC while on Tirofiban.  f/u stop time for Tirofiban (>2hrs prior to procedure)  Angela Burke,  PharmD Pharmacy Resident Pager: [phone] 03/04/2015,10:38 AM

## 2015-03-04 NOTE — Consult Note (Signed)
   [facility] CM Inpatient Consult   03/04/2015  ALAYSIA LIGHTLE 05-19-1946 [id number] Referral received to assess for care management services.  [facility] Care Management is a covered benefit of [employer] PPO insurance.   Met with the patient regarding the benefits of [facility] services. Review information for [facility] Care Management and a folder was provided with contact information.  Explained that [facility] Management does not interfere with or replace any services arranged by the inpatient care management staff.  Patient declined services with [facility] at this time.  Patient states, "Is this the service where you can send a nurse to the house to take vitals. Explained [facility]'s role for hospital follow up and care/disease management needs.  Patient states, "when I get out of here I will be having a lot of follow appointments. So, I feel like I am pretty much covered. They have been calling me for this service, and it's a great service but, I don't think I need it."  Brochure given with contact information and encouraged to call if her needs changes.For questions, please contact: Natividad Brood, RN BSN [facility] Liaison  [phone] business mobile phone

## 2015-03-05 LAB — CBC
HCT: 33.2 % — ABNORMAL LOW (ref 36.0–46.0)
Hemoglobin: 11.3 g/dL — ABNORMAL LOW (ref 12.0–15.0)
MCH: 31.7 pg (ref 26.0–34.0)
MCHC: 34 g/dL (ref 30.0–36.0)
MCV: 93 fL (ref 78.0–100.0)
PLATELETS: 165 10*3/uL (ref 150–400)
RBC: 3.57 MIL/uL — ABNORMAL LOW (ref 3.87–5.11)
RDW: 14.8 % (ref 11.5–15.5)
WBC: 9.7 10*3/uL (ref 4.0–10.5)

## 2015-03-05 LAB — BASIC METABOLIC PANEL
Anion gap: 6 (ref 5–15)
BUN: 7 mg/dL (ref 6–20)
CO2: 27 mmol/L (ref 22–32)
Calcium: 8.8 mg/dL — ABNORMAL LOW (ref 8.9–10.3)
Chloride: 102 mmol/L (ref 101–111)
Creatinine, Ser: 0.6 mg/dL (ref 0.44–1.00)
Glucose, Bld: 120 mg/dL — ABNORMAL HIGH (ref 65–99)
POTASSIUM: 4.9 mmol/L (ref 3.5–5.1)
SODIUM: 135 mmol/L (ref 135–145)

## 2015-03-05 MED ORDER — POLYETHYLENE GLYCOL 3350 17 G PO PACK
17.0000 g | PACK | Freq: Every day | ORAL | Status: DC | PRN
  Administered 2015-03-05: 17 g via ORAL
  Filled 2015-03-05 (×2): qty 1

## 2015-03-05 NOTE — Progress Notes (Signed)
DAILY PROGRESS NOTE  Subjective:  69 y/o female referred to [name] in early June 2016 with complaints of fatigue, DOE, and palpitations. The pt had risk factors for CAD and had PVD (moderate bilateral ICA disease). Work up done included and echo ( essentially normal) and a Myoview 02/02/15 that was positive for ischemia. She underwent cath and was found to have severe LAD and RCA disease. She underwent DES placement and was enrolled in the TWILIGHT study 02/07/15.  She was recently found to have liver mets.  Has had pronounced weight loss. Because of the recent stent, she was admitted by the cardiology service .  The Brilinta was stopped and she is now on aggrestat while we are waiting for the Brilinta to wear off in preperation of a liver bx.  She is also going to have a Port placed on Monday    No events overnight. Remains on aggrestat. No chest pain.   Objective:  Temp:  [97.6 F (36.4 C)-98.5 F (36.9 C)] 98.3 F (36.8 C) (07/23 0459) Pulse Rate:  [72-85] 72 (07/23 0459) Resp:  [18] 18 (07/23 0459) BP: (120-134)/(56-61) 134/57 mmHg (07/23 0459) SpO2:  [98 %-100 %] 100 % (07/23 0459) Weight change:   Intake/Output from previous day: 07/22 0701 - 07/23 0700 In: 619.8 [P.O.:520; I.V.:99.8] Out: -   Intake/Output from this shift:    Medications: Current Facility-Administered Medications  Medication Dose Route Frequency Provider Last Rate Last Dose  . 0.9 %  sodium chloride infusion  250 mL Intravenous PRN [name], PA-C      . acetaminophen (TYLENOL) tablet 650 mg  650 mg Oral Q4H PRN [name], PA-C   650 mg at 03/03/15 2018  . ALPRAZolam [name]) tablet 0.25 mg  0.25 mg Oral BID PRN [name], PA-C      . aspirin chewable tablet 81 mg  81 mg Oral Daily [name], MD   81 mg at 03/04/15 1027  . atorvastatin (LIPITOR) tablet 80 mg  80 mg Oral [address] Kil[place], PA-C   80 mg at 03/04/15 1751  . Azelaic Acid 15 % 1 application  1 application Topical  Daily PRN [name], PA-C      . carbamazepine (TEGRETOL XR) 12 hr tablet 200 mg  200 mg Oral Daily [name], PA-C   200 mg at 03/04/15 1018  . carbamazepine (TEGRETOL XR) 12 hr tablet 400 mg  400 mg Oral QHS [name], PA-C   400 mg at 03/04/15 2212  . dexamethasone (DECADRON) tablet 2 mg  2 mg Oral BID WC [name], PA-C   2 mg at 03/05/15 4496  . feeding supplement (BOOST / RESOURCE BREEZE) liquid 1 Container  [place], RD   1 Container at 03/03/15 2018  . fluticasone (FLONASE) 50 MCG/ACT nasal spray 1 spray  1 spray Each Nare Daily PRN [name], PA-C      . lactase (LACTAID) tablet 3,000 Units  1 tablet Oral Daily PRN [name], PA-C      . levofloxacin [facility]) tablet 500 mg  500 mg Oral q1800 [name], PA-C   500 mg at 03/04/15 1751  . lipase/protease/amylase (CREON) capsule 12,000 Units  12,000 Units Oral TID [facility] [name], PA-C   12,000 Units at 03/05/15 [phone]  . lisinopril (PRINIVIL,ZESTRIL) tablet 5 mg  5 mg Oral Daily [name], [place]  5 mg at 03/04/15 1018  . metoprolol tartrate (LOPRESSOR) tablet 12.5 mg  12.5 mg Oral BID [name], PA-C   12.5 mg at 03/04/15 2212  . nitroGLYCERIN (NITROSTAT) SL tablet 0.4 mg  0.4 mg Sublingual Q5 Min x 3 PRN [name], PA-C      . ondansetron [facility]) injection 4 mg  4 mg Intravenous Q6H PRN [name], PA-C   4 mg at 03/05/15 0836  . oxyCODONE (Oxy IR/ROXICODONE) immediate release tablet 5 mg  5 mg Oral Q4H PRN [name], PA-C   5 mg at 03/03/15 0818  . sodium chloride 0.9 % injection 3 mL  3 mL Intravenous Q12H [name], PA-C   3 mL at 03/03/15 0957  . sodium chloride 0.9 % injection 3 mL  3 mL Intravenous PRN [name], PA-C      . tirofiban (AGGRASTAT) infusion 50 mcg/mL 250 mL  0.075 mcg/kg/min Intravenous Continuous [name], MD 4.6 mL/hr at 03/04/15 1127 0.075 mcg/kg/min at 03/04/15 1127  . zolpidem (AMBIEN) tablet 5 mg  5 mg Oral QHS PRN [name], PA-C   5 mg at 03/04/15 2212    Physical Exam: General appearance: alert and no distress Lungs: diminished breath sounds bibasilar Heart: regular rate and rhythm, S1, S2 normal, no murmur, click, rub or gallop Extremities: extremities normal, atraumatic, no cyanosis or edema Neurologic: Grossly normal  Lab Results: Results for orders placed or performed during the hospital encounter of 03/02/15 (from the past 48 hour(s))  CBC     Status: Abnormal   Collection Time: 03/03/15 10:23 AM  Result Value Ref Range   WBC 9.7 4.0 - 10.5 K/uL   RBC 3.78 (L) 3.87 - 5.11 MIL/uL   Hemoglobin 11.9 (L) 12.0 - 15.0 g/dL   HCT 35.1 (L) 36.0 - 46.0 %   MCV 92.9 78.0 - 100.0 fL   MCH 31.5 26.0 - 34.0 pg   MCHC 33.9 30.0 - 36.0 g/dL   RDW 14.8 11.5 - 15.5 %   Platelets 143 (L) 150 - 400 K/uL  CBC     Status: Abnormal   Collection Time: 03/04/15  4:16 AM  Result Value Ref Range   WBC 9.4 4.0 - 10.5 K/uL   RBC 3.59 (L) 3.87 - 5.11 MIL/uL   Hemoglobin 11.3 (L) 12.0 - 15.0 g/dL   HCT 32.9 (L) 36.0 - 46.0 %   MCV 91.6 78.0 - 100.0 fL   MCH 31.5 26.0 - 34.0 pg   MCHC 34.3 30.0 - 36.0 g/dL   RDW 14.7 11.5 - 15.5 %   Platelets 142 (L) 150 - 400 K/uL  CBC     Status: Abnormal   Collection Time: 03/05/15  3:30 AM  Result Value Ref Range   WBC 9.7 4.0 - 10.5 K/uL    Comment: WHITE COUNT CONFIRMED ON SMEAR   RBC 3.57 (L) 3.87 - 5.11 MIL/uL   Hemoglobin 11.3 (L) 12.0 - 15.0 g/dL   HCT 33.2 (L) 36.0 - 46.0 %   MCV 93.0 78.0 - 100.0 fL   MCH 31.7 26.0 - 34.0 pg   MCHC 34.0 30.0 - 36.0 g/dL   RDW 14.8 11.5 - 15.5 %   Platelets 165 150 - 400 K/uL  Basic metabolic panel     Status: Abnormal   Collection Time: 03/05/15  3:30 AM  Result Value Ref Range   Sodium 135 135 - 145 mmol/L   Potassium 4.9 3.5 -  5.1 mmol/L   Chloride 102 101 - 111 mmol/L   CO2 27 22 - 32 mmol/L   Glucose, Bld 120 (H) 65 - 99 mg/dL   BUN 7 6 - 20 mg/dL   Creatinine, Ser 0.60 0.44 - 1.00 mg/dL   Calcium 8.8 (L) 8.9 -  10.3 mg/dL   GFR calc non Af Amer >60 >60 mL/min   GFR calc Af Amer >60 >60 mL/min    Comment: (NOTE) The eGFR has been calculated using the CKD EPI equation. This calculation has not been validated in all clinical situations. eGFR's persistently <60 mL/min signify possible Chronic Kidney Disease.    Anion gap 6 5 - 15    Imaging: No results found.  Assessment:  Principal Problem:   Metastasis to liver with unknown primary site Active Problems:   Dyslipidemia   Essential hypertension   CAD S/P RCA, LAD DES-02/07/15   Protein calorie malnutrition   Liver cancer    Plan:  1. CAD - s/p recent stent .  Currently off Brilinta and on Aggreastat in preparation for several procedures on Monday - liver bx and placement of a port.  2. ? Cancer :  For liver bx on Monday .  Also for port placemnt Orders to be written by oncology / interventional radiology       Time Spent Directly with Patient:  15 minutes  Length of Stay:  LOS: 3 days     Nahser, Wonda Cheng, MD  03/05/2015 10:21 AM    [place],  [place], [place]  [address] Pager [phone] Phone: [phone]; Fax: [phone]   [facility]  [address] [place], [place]  [address] [phone]   Fax [phone]

## 2015-03-05 NOTE — Progress Notes (Signed)
ANTICOAGULATION CONSULT NOTE - Initial Consult  Pharmacy Consult for tirofiban Indication: recent DES off antiplatelet for biopsy  Allergies  Allergen Reactions  . Boniva [Ibandronic Acid]     DIARRHEA   . Delsym [Dextromethorphan] Diarrhea  . Erythromycin     INTERACTION WITH TEGRETOL AND SIDE EFFECTS   . Fosamax [Alendronate Sodium] Diarrhea  . Glucosamine Forte [Nutritional Supplements]     VARIOCELES IN MOUTH AND SIDE EFFECTS   . Guaifenesin & Derivatives     SLEEPY AND SIDE EFFECTS  . Lactose Intolerance (Gi) Diarrhea    Intolerance, gas  . Nexium [Esomeprazole Magnesium]     DIZZINESS  . Tegretol [Carbamazepine] Other (See Comments)    Can only take BRAND, no generic (causes dizziness, heightens side effects because it releases more of the drug, does not keep blood levels steady)  . Zostavax [Zoster Vaccine Live]     DIZZINESS, HEADACHES, DYSPHAGIA    Patient Measurements: Height: 5\' 3"  (160 cm) Weight: 113 lb 9.6 oz (51.529 kg) IBW/kg (Calculated) : 52.4 Dosing Weight: 51.5 kg  Vital Signs: Temp: 98.3 F (36.8 C) (07/23 0459) Temp Source: Oral (07/23 0459) BP: 134/57 mmHg (07/23 0459) Pulse Rate: 72 (07/23 0459)  Labs:  Recent Labs  03/02/15 1513  03/03/15 1023 03/04/15 0416 03/05/15 0330  HGB 12.5  < > 11.9* 11.3* 11.3*  HCT 36.5  < > 35.1* 32.9* 33.2*  PLT 142*  < > 143* 142* 165  LABPROT 15.3*  --   --   --   --   INR 1.19  --   --   --   --   CREATININE 0.68  --   --   --  0.60  < > = values in this interval not displayed.  Estimated Creatinine Clearance: 55.5 mL/min (by C-G formula based on Cr of 0.6).   Medical History: Past Medical History  Diagnosis Date  . Osteopenia   . Hypercholesterolemia   . Menopause   . Rosacea   . Lactose intolerance   . TMJ (dislocation of temporomandibular joint)   . Fatigue   . Rosacea   . TMJ (dislocation of temporomandibular joint)   . Mild aortic stenosis     echo 01/2015  . Coronary artery disease    . Family history of adverse reaction to anesthesia     "my mother gets PONV"  . GERD (gastroesophageal reflux disease)   . Epileptic seizure     "controlled; started w/my periods; last one was years ago" (02/07/2015)  . Sleep disorder   . 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 CA 19-9 levels On 02/21/2015 were noted to be 11,800 making pancreatic adenocarcinoma the likely primary site.     Medications:  Prescriptions prior to admission  Medication Sig Dispense Refill Last Dose  . AMBULATORY NON FORMULARY MEDICATION Take 90 mg by mouth 2 (two) times daily. Medication Name: Brilinta 90 mg BID provided by TWILIGHT Research study (Do Not Fill)   03/01/2015 at Unknown time  . AMBULATORY NON FORMULARY MEDICATION Take 81 mg by mouth daily. Medication Name: ASA 81 mg daily Provided by TWILIGHT study   03/02/2015 at Unknown time  . atorvastatin (LIPITOR) 80 MG tablet Take 1 tablet (80 mg total) by mouth daily at 6 PM. 30 tablet 11 03/01/2015 at Unknown time  . Azelaic Acid 15 % cream  Apply 1 application topically daily as needed (for acne and rosacea).    03/01/2015 at Unknown time  . carbamazepine (TEGRETOL XR) 400 MG 12 hr tablet Take 400 mg by mouth at bedtime. 200mg  AM, 400mg  PM   03/01/2015 at Unknown time  . dexamethasone (DECADRON) 2 MG tablet Take 1 tablet (2 mg total) by mouth 2 (two) times daily with a meal. 30 tablet 0 03/02/2015 at Unknown time  . lactase (LACTAID) 3000 UNITS tablet Take 1 tablet by mouth daily as needed (for lactose intolerance).   03/01/2015 at Unknown time  . levofloxacin (LEVAQUIN) 500 MG tablet Take 1 tablet (500 mg total) by mouth daily. (Patient taking differently: Take 500 mg by mouth daily at 6 PM. ) 5 tablet 0 03/01/2015 at Unknown time  . lipase/protease/amylase (CREON) 12000 UNITS CPEP capsule Take 1  capsule (12,000 Units total) by mouth 3 (three) times daily before meals. 270 capsule 1 03/02/2015 at Unknown time  . lisinopril (PRINIVIL,ZESTRIL) 5 MG tablet Take 1 tablet (5 mg total) by mouth daily. 30 tablet 11 03/02/2015 at Unknown time  . metoprolol tartrate (LOPRESSOR) 25 MG tablet Take 0.5 tablets (12.5 mg total) by mouth 2 (two) times daily. 60 tablet 4 03/02/2015 at 0830  . Multiple Vitamins-Minerals (MULTIVITAMIN & MINERAL PO) Take 1 tablet by mouth at bedtime.    03/01/2015 at Unknown time  . ondansetron (ZOFRAN) 8 MG tablet Take 1 tablet (8 mg total) by mouth every 8 (eight) hours as needed for nausea or vomiting. 30 tablet 3 03/02/2015 at Unknown time  . oxyCODONE (OXY IR/ROXICODONE) 5 MG immediate release tablet Take 1 tablet (5 mg total) by mouth every 4 (four) hours as needed for severe pain. (Patient taking differently: Take 5 mg by mouth every 4 (four) hours as needed for severe pain (for cough). ) 60 tablet 0 03/02/2015 at Unknown time  . TEGRETOL-XR 200 MG 12 hr tablet Take 200 mg by mouth daily.    03/02/2015 at Unknown time  . acetaminophen (TYLENOL) 325 MG tablet Take 650 mg by mouth every 6 (six) hours as needed for mild pain.   3 months  . fluticasone (FLONASE) 50 MCG/ACT nasal spray Place 1 spray into both nostrils daily as needed for allergies.    02/27/2015  . zaleplon (SONATA) 5 MG capsule Take 5 mg by mouth at bedtime as needed for sleep.   2 weeks    Assessment: 69 yo female admitted for liver biopsy. Recently had [place] cath on 02/07/15, received DES x2 to LAD and x1 to RCA. On ASA, atorvastatin, and Brilinta PTA. Part of TWILIGHT research study.  Pharmacy consulted to dose tirofiban.  Last dose of Brilinta 7/19 at 2030. Holding Brilinta and starting tirofiban for liver biopsy. H/H and plt stable.  Given renal function, wt, and PTA Brilinta use, will start tirofiban 0.075 mcg/kg/min (dose adjusted for renal functio and weight) CrCl 59ml/min  using actual body wt (51.5 kg).     Hgb 11.3, Plt 165, sCr 0.6.  Goal of Therapy:  Monitor platelets by anticoagulation protocol: Yes   Plan:  Continue tirofiban 0.075 mcg/kg/min Monitor CBC daily F/u stop time for procedures Monday  Andrey Cota. Diona Foley, PharmD Clinical Pharmacist Pager [phone] 03/05/2015,11:20 AM

## 2015-03-06 ENCOUNTER — Inpatient Hospital Stay (HOSPITAL_COMMUNITY): Payer: Medicare PPO

## 2015-03-06 LAB — CBC
HCT: 36.2 % (ref 36.0–46.0)
HEMOGLOBIN: 12.1 g/dL (ref 12.0–15.0)
MCH: 30.9 pg (ref 26.0–34.0)
MCHC: 33.4 g/dL (ref 30.0–36.0)
MCV: 92.6 fL (ref 78.0–100.0)
Platelets: 179 10*3/uL (ref 150–400)
RBC: 3.91 MIL/uL (ref 3.87–5.11)
RDW: 14.9 % (ref 11.5–15.5)
WBC: 10.6 10*3/uL — ABNORMAL HIGH (ref 4.0–10.5)

## 2015-03-06 MED ORDER — LOSARTAN POTASSIUM 25 MG PO TABS
25.0000 mg | ORAL_TABLET | Freq: Every day | ORAL | Status: DC
  Administered 2015-03-06 – 2015-03-09 (×4): 25 mg via ORAL
  Filled 2015-03-06 (×4): qty 1

## 2015-03-06 MED ORDER — ASPIRIN 81 MG PO CHEW
81.0000 mg | CHEWABLE_TABLET | Freq: Every day | ORAL | Status: DC
  Administered 2015-03-06 – 2015-03-09 (×4): 81 mg via ORAL
  Filled 2015-03-06 (×4): qty 1

## 2015-03-06 MED ORDER — ALUM & MAG HYDROXIDE-SIMETH 200-200-20 MG/5ML PO SUSP
30.0000 mL | ORAL | Status: DC | PRN
  Administered 2015-03-06 – 2015-03-08 (×2): 30 mL via ORAL
  Filled 2015-03-06 (×3): qty 30

## 2015-03-06 NOTE — Progress Notes (Signed)
DAILY PROGRESS NOTE  Subjective:  69 y/o female referred to [name] in early June 2016 with complaints of fatigue, DOE, and palpitations. The pt had risk factors for CAD and had PVD (moderate bilateral ICA disease). Work up done included and echo ( essentially normal) and a Myoview 02/02/15 that was positive for ischemia. Judy Cummings underwent cath and was found to have severe LAD and RCA disease. Judy Cummings underwent DES placement and was enrolled in the TWILIGHT study 02/07/15.  Judy Cummings was recently found to have liver mets.  Has had pronounced weight loss. Because of the recent stent, Judy Cummings was admitted by the cardiology service .  The Brilinta was stopped and Judy Cummings is now on aggrastat while we are waiting for the Brilinta to wear off in preperation of a liver bx.  Judy Cummings is also going to have a Port placed on Monday   No events overnight. Remains on aggrastat. No significant chest pain.  Did have some gas last night.  No SOB.  Still coughing.  No sputum production.    Objective:  Temp:  [98 F (36.7 C)-98.6 F (37 C)] 98.1 F (36.7 C) (07/24 0454) Pulse Rate:  [80-87] 81 (07/24 0454) Resp:  [16-18] 18 (07/24 0454) BP: (125-146)/(47-70) 146/70 mmHg (07/24 0454) SpO2:  [98 %-100 %] 98 % (07/24 0454) Weight change:   Intake/Output from previous day: 07/23 0701 - 07/24 0700 In: 455.2 [P.O.:400; I.V.:55.2] Out: -   Intake/Output from this shift:    Medications: Current Facility-Administered Medications  Medication Dose Route Frequency Provider Last Rate Last Dose  . 0.9 %  sodium chloride infusion  250 mL Intravenous PRN [name], PA-C      . acetaminophen (TYLENOL) tablet 650 mg  650 mg Oral Q4H PRN [name], PA-C   650 mg at 03/06/15 0323  . ALPRAZolam [name]) tablet 0.25 mg  0.25 mg Oral BID PRN [name], PA-C      . aspirin chewable tablet 81 mg  81 mg Oral Daily [name], MD   81 mg at 03/05/15 1014  . atorvastatin (LIPITOR) tablet 80 mg  80 mg Oral [address] [place],  PA-C   80 mg at 03/05/15 1815  . Azelaic Acid 15 % 1 application  1 application Topical Daily PRN [name], PA-C      . carbamazepine (TEGRETOL XR) 12 hr tablet 200 mg  200 mg Oral Daily [name], PA-C   200 mg at 03/05/15 1014  . carbamazepine (TEGRETOL XR) 12 hr tablet 400 mg  400 mg Oral QHS [name], PA-C   400 mg at 03/05/15 2139  . dexamethasone (DECADRON) tablet 2 mg  2 mg Oral BID WC [name], PA-C   2 mg at 03/06/15 0756  . feeding supplement (BOOST / RESOURCE BREEZE) liquid 1 Container  [place], RD   1 Container at 03/03/15 2018  . fluticasone (FLONASE) 50 MCG/ACT nasal spray 1 spray  1 spray Each Nare Daily PRN [name], PA-C      . lactase (LACTAID) tablet 3,000 Units  1 tablet Oral Daily PRN [name], PA-C      . levofloxacin [facility]) tablet 500 mg  500 mg Oral q1800 [name], PA-C   500 mg at 03/05/15 1815  . lipase/protease/amylase (CREON) capsule 12,000 Units  12,000 Units Oral TID [name] [name], PA-C   12,000 Units at 03/06/15 4010  .  losartan (COZAAR) tablet 25 mg  25 mg Oral Daily [name], PA-C      . metoprolol tartrate (LOPRESSOR) tablet 12.5 mg  12.5 mg Oral BID [name], PA-C   12.5 mg at 03/05/15 2139  . nitroGLYCERIN (NITROSTAT) SL tablet 0.4 mg  0.4 mg Sublingual Q5 Min x 3 PRN [name], PA-C      . ondansetron [facility]) injection 4 mg  4 mg Intravenous Q6H PRN [name], PA-C   4 mg at 03/05/15 0836  . oxyCODONE (Oxy IR/ROXICODONE) immediate release tablet 5 mg  5 mg Oral Q4H PRN [name], PA-C   5 mg at 03/03/15 0818  . polyethylene glycol (MIRALAX / GLYCOLAX) packet 17 g  17 g Oral Daily PRN [name], MD   17 g at 03/05/15 2035  . sodium chloride 0.9 % injection 3 mL  3 mL Intravenous Q12H [name], PA-C   3 mL at 03/03/15 0957  . sodium chloride 0.9 % injection 3 mL  3 mL Intravenous PRN [name], PA-C      . tirofiban (AGGRASTAT) infusion 50 mcg/mL 250 mL   0.075 mcg/kg/min Intravenous Continuous [name], MD 4.6 mL/hr at 03/04/15 1127 0.075 mcg/kg/min at 03/04/15 1127  . zolpidem (AMBIEN) tablet 5 mg  5 mg Oral QHS PRN [name], PA-C   5 mg at 03/04/15 2212    Physical Exam: General appearance: alert and no distress Lungs: CTA bilaterally, no rales, no wheezing Heart: regular rate and rhythm, S1, S2 normal, no murmur, click, rub or gallop Abdomen: soft Extremities: extremities normal, atraumatic, no cyanosis or edema Neurologic: Grossly normal  Lab Results: Results for orders placed or performed during the hospital encounter of 03/02/15 (from the past 48 hour(s))  CBC     Status: Abnormal   Collection Time: 03/05/15  3:30 AM  Result Value Ref Range   WBC 9.7 4.0 - 10.5 K/uL    Comment: WHITE COUNT CONFIRMED ON SMEAR   RBC 3.57 (L) 3.87 - 5.11 MIL/uL   Hemoglobin 11.3 (L) 12.0 - 15.0 g/dL   HCT 33.2 (L) 36.0 - 46.0 %   MCV 93.0 78.0 - 100.0 fL   MCH 31.7 26.0 - 34.0 pg   MCHC 34.0 30.0 - 36.0 g/dL   RDW 14.8 11.5 - 15.5 %   Platelets 165 150 - 400 K/uL  Basic metabolic panel     Status: Abnormal   Collection Time: 03/05/15  3:30 AM  Result Value Ref Range   Sodium 135 135 - 145 mmol/L   Potassium 4.9 3.5 - 5.1 mmol/L   Chloride 102 101 - 111 mmol/L   CO2 27 22 - 32 mmol/L   Glucose, Bld 120 (H) 65 - 99 mg/dL   BUN 7 6 - 20 mg/dL   Creatinine, Ser 0.60 0.44 - 1.00 mg/dL   Calcium 8.8 (L) 8.9 - 10.3 mg/dL   GFR calc non Af Amer >60 >60 mL/min   GFR calc Af Amer >60 >60 mL/min    Comment: (NOTE) The eGFR has been calculated using the CKD EPI equation. This calculation has not been validated in all clinical situations. eGFR's persistently <60 mL/min signify possible Chronic Kidney Disease.    Anion gap 6 5 - 15  CBC     Status: Abnormal   Collection Time: 03/06/15  3:20 AM  Result Value Ref Range   WBC 10.6 (H) 4.0 - 10.5 K/uL  RBC 3.91 3.87 - 5.11 MIL/uL   Hemoglobin 12.1 12.0 - 15.0 g/dL   HCT 36.2 36.0 -  46.0 %   MCV 92.6 78.0 - 100.0 fL   MCH 30.9 26.0 - 34.0 pg   MCHC 33.4 30.0 - 36.0 g/dL   RDW 14.9 11.5 - 15.5 %   Platelets 179 150 - 400 K/uL    Imaging: No results found.  Assessment:  Principal Problem:   Metastasis to liver with unknown primary site Active Problems:   Dyslipidemia   Essential hypertension   CAD S/P RCA, LAD DES-02/07/15   Protein calorie malnutrition   Liver cancer    Plan:  1. CAD - s/p recent stent .  Currently off Brilinta and on Aggrastat in preparation for several procedures on Monday - liver bx and placement of a port.  2. ? Cancer :  For liver bx on Monday .  Also for port placemnt Orders to be written by oncology / interventional radiology     3.  URI:  Recent febrile illness treated with Levaquin x 5 days.  Still coughing but this is improved.  No sputum production. Question of cough persistent in setting of ACE inhibitor therapy.  Will DC Lisinopril and start Losartan 25 mg QD.  WBC minimally elevated.  Repeat CXR.   Time Spent Directly with Patient:  15 minutes  Length of Stay:  LOS: 4 days    Signed,  Richardson Dopp, PA-C  03/06/2015 8:14 AM    [place] [address]  [place], [place]  [address] Phone: [phone]; Fax: [phone]   Attending Note:   The patient was seen and examined.  Agree with assessment and plan as noted above.  Changes made to the above note as needed.  I have advised her to hold her breakfast tomorrow.   Scheduled for liver bx and placement of a port.  I do not see a note from IR or oncology yet.    Thayer Headings, Brooke Bonito., MD, [facility] 03/06/2015, 9:22 AM [address],  [place] Pager [phone]

## 2015-03-06 NOTE — Progress Notes (Signed)
ANTICOAGULATION CONSULT NOTE - Initial Consult  Pharmacy Consult for tirofiban Indication: recent DES off antiplatelet for biopsy  Allergies  Allergen Reactions  . Boniva [Ibandronic Acid]     DIARRHEA   . Delsym [Dextromethorphan] Diarrhea  . Erythromycin     INTERACTION WITH TEGRETOL AND SIDE EFFECTS   . Fosamax [Alendronate Sodium] Diarrhea  . Glucosamine Forte [Nutritional Supplements]     VARIOCELES IN MOUTH AND SIDE EFFECTS   . Guaifenesin & Derivatives     SLEEPY AND SIDE EFFECTS  . Lactose Intolerance (Gi) Diarrhea    Intolerance, gas  . Nexium [Esomeprazole Magnesium]     DIZZINESS  . Tegretol [Carbamazepine] Other (See Comments)    Can only take BRAND, no generic (causes dizziness, heightens side effects because it releases more of the drug, does not keep blood levels steady)  . Zostavax [Zoster Vaccine Live]     DIZZINESS, HEADACHES, DYSPHAGIA    Patient Measurements: Height: 5\' 3"  (160 cm) Weight: 113 lb 9.6 oz (51.529 kg) IBW/kg (Calculated) : 52.4 Dosing Weight: 51.5 kg  Vital Signs: Temp: 98.1 F (36.7 C) (07/24 0454) Temp Source: Oral (07/24 0454) BP: 123/57 mmHg (07/24 1024) Pulse Rate: 82 (07/24 1024)  Labs:  Recent Labs  03/04/15 0416 03/05/15 0330 03/06/15 0320  HGB 11.3* 11.3* 12.1  HCT 32.9* 33.2* 36.2  PLT 142* 165 179  CREATININE  --  0.60  --     Estimated Creatinine Clearance: 55.5 mL/min (by C-G formula based on Cr of 0.6).   Medical History: Past Medical History  Diagnosis Date  . Osteopenia   . Hypercholesterolemia   . Menopause   . Rosacea   . Lactose intolerance   . TMJ (dislocation of temporomandibular joint)   . Fatigue   . Rosacea   . TMJ (dislocation of temporomandibular joint)   . Mild aortic stenosis     echo 01/2015  . Coronary artery disease   . Family history of adverse reaction to anesthesia     "my mother gets PONV"  . GERD (gastroesophageal reflux disease)   . Epileptic seizure     "controlled;  started w/my periods; last one was years ago" (02/07/2015)  . Sleep disorder   . 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 CA 19-9 levels On 02/21/2015 were noted to be 11,800 making pancreatic adenocarcinoma the likely primary site.     Medications:  Prescriptions prior to admission  Medication Sig Dispense Refill Last Dose  . AMBULATORY NON FORMULARY MEDICATION Take 90 mg by mouth 2 (two) times daily. Medication Name: Brilinta 90 mg BID provided by TWILIGHT Research study (Do Not Fill)   03/01/2015 at Unknown time  . AMBULATORY NON FORMULARY MEDICATION Take 81 mg by mouth daily. Medication Name: ASA 81 mg daily Provided by TWILIGHT study   03/02/2015 at Unknown time  . atorvastatin (LIPITOR) 80 MG tablet Take 1 tablet (80 mg total) by mouth daily at 6 PM. 30 tablet 11 03/01/2015 at Unknown time  . Azelaic Acid 15 % cream Apply 1 application topically daily as needed (for acne and rosacea).    03/01/2015 at Unknown time  . carbamazepine (TEGRETOL XR) 400 MG 12 hr tablet Take 400 mg by mouth at bedtime. 200mg  AM, 400mg  PM   03/01/2015 at Unknown time  . dexamethasone (DECADRON) 2 MG tablet Take 1 tablet (2 mg  total) by mouth 2 (two) times daily with a meal. 30 tablet 0 03/02/2015 at Unknown time  . lactase (LACTAID) 3000 UNITS tablet Take 1 tablet by mouth daily as needed (for lactose intolerance).   03/01/2015 at Unknown time  . levofloxacin (LEVAQUIN) 500 MG tablet Take 1 tablet (500 mg total) by mouth daily. (Patient taking differently: Take 500 mg by mouth daily at 6 PM. ) 5 tablet 0 03/01/2015 at Unknown time  . lipase/protease/amylase (CREON) 12000 UNITS CPEP capsule Take 1 capsule (12,000 Units total) by mouth 3 (three) times daily before meals. 270 capsule 1 03/02/2015 at Unknown time  . lisinopril (PRINIVIL,ZESTRIL) 5 MG tablet Take 1  tablet (5 mg total) by mouth daily. 30 tablet 11 03/02/2015 at Unknown time  . metoprolol tartrate (LOPRESSOR) 25 MG tablet Take 0.5 tablets (12.5 mg total) by mouth 2 (two) times daily. 60 tablet 4 03/02/2015 at 0830  . Multiple Vitamins-Minerals (MULTIVITAMIN & MINERAL PO) Take 1 tablet by mouth at bedtime.    03/01/2015 at Unknown time  . ondansetron (ZOFRAN) 8 MG tablet Take 1 tablet (8 mg total) by mouth every 8 (eight) hours as needed for nausea or vomiting. 30 tablet 3 03/02/2015 at Unknown time  . oxyCODONE (OXY IR/ROXICODONE) 5 MG immediate release tablet Take 1 tablet (5 mg total) by mouth every 4 (four) hours as needed for severe pain. (Patient taking differently: Take 5 mg by mouth every 4 (four) hours as needed for severe pain (for cough). ) 60 tablet 0 03/02/2015 at Unknown time  . TEGRETOL-XR 200 MG 12 hr tablet Take 200 mg by mouth daily.    03/02/2015 at Unknown time  . acetaminophen (TYLENOL) 325 MG tablet Take 650 mg by mouth every 6 (six) hours as needed for mild pain.   3 months  . fluticasone (FLONASE) 50 MCG/ACT nasal spray Place 1 spray into both nostrils daily as needed for allergies.    02/27/2015  . zaleplon (SONATA) 5 MG capsule Take 5 mg by mouth at bedtime as needed for sleep.   2 weeks    Assessment: 69 yo female admitted for liver biopsy. Recently had [place] cath on 02/07/15, received DES x2 to LAD and x1 to RCA. On ASA, atorvastatin, and Brilinta PTA. Part of TWILIGHT research study.  Pharmacy consulted to dose tirofiban.  Last dose of Brilinta 7/19 at 2030. Holding Brilinta and starting tirofiban for liver biopsy. H/H and plt stable.  Given renal function, wt, and PTA Brilinta use, will start tirofiban 0.075 mcg/kg/min (dose adjusted for renal functio and weight) CrCl 74ml/min  using actual body wt (51.5 kg).    Hgb 12.1, Plt 179, sCr 0.6.  Goal of Therapy:  Monitor platelets by anticoagulation protocol: Yes   Plan:  Continue tirofiban 0.075 mcg/kg/min Monitor CBC  daily F/u stop time for procedures Monday  Andrey Cota. Diona Foley, PharmD Clinical Pharmacist Pager [phone]  03/06/2015,10:53 AM

## 2015-03-07 ENCOUNTER — Inpatient Hospital Stay (HOSPITAL_COMMUNITY): Payer: Medicare PPO

## 2015-03-07 ENCOUNTER — Ambulatory Visit (HOSPITAL_COMMUNITY): Admission: RE | Admit: 2015-03-07 | Payer: Medicare PPO | Source: Ambulatory Visit | Admitting: Cardiology

## 2015-03-07 DIAGNOSIS — K769 Liver disease, unspecified: Secondary | ICD-10-CM | POA: Insufficient documentation

## 2015-03-07 DIAGNOSIS — K7689 Other specified diseases of liver: Secondary | ICD-10-CM

## 2015-03-07 LAB — CBC
HEMATOCRIT: 33.3 % — AB (ref 36.0–46.0)
HEMOGLOBIN: 11.3 g/dL — AB (ref 12.0–15.0)
MCH: 31.8 pg (ref 26.0–34.0)
MCHC: 33.9 g/dL (ref 30.0–36.0)
MCV: 93.8 fL (ref 78.0–100.0)
PLATELETS: 184 10*3/uL (ref 150–400)
RBC: 3.55 MIL/uL — ABNORMAL LOW (ref 3.87–5.11)
RDW: 14.8 % (ref 11.5–15.5)
WBC: 11.6 10*3/uL — AB (ref 4.0–10.5)

## 2015-03-07 MED ORDER — LIDOCAINE HCL 1 % IJ SOLN
INTRAMUSCULAR | Status: AC
  Filled 2015-03-07: qty 20

## 2015-03-07 MED ORDER — FENTANYL CITRATE (PF) 100 MCG/2ML IJ SOLN
INTRAMUSCULAR | Status: AC
  Filled 2015-03-07: qty 4

## 2015-03-07 MED ORDER — FENTANYL CITRATE (PF) 100 MCG/2ML IJ SOLN
INTRAMUSCULAR | Status: AC
  Filled 2015-03-07: qty 2

## 2015-03-07 MED ORDER — TIROFIBAN HCL IV 12.5 MG/250 ML
0.0750 ug/kg/min | INTRAVENOUS | Status: DC
  Administered 2015-03-07: 0.075 ug/kg/min via INTRAVENOUS
  Filled 2015-03-07: qty 250

## 2015-03-07 MED ORDER — CEFAZOLIN SODIUM 1-5 GM-% IV SOLN
INTRAVENOUS | Status: AC
  Filled 2015-03-07: qty 50

## 2015-03-07 MED ORDER — HEPARIN SOD (PORK) LOCK FLUSH 100 UNIT/ML IV SOLN
INTRAVENOUS | Status: AC
  Filled 2015-03-07: qty 5

## 2015-03-07 MED ORDER — FENTANYL CITRATE (PF) 100 MCG/2ML IJ SOLN
INTRAMUSCULAR | Status: AC | PRN
  Administered 2015-03-07: 25 ug via INTRAVENOUS

## 2015-03-07 MED ORDER — SODIUM CHLORIDE 0.9 % IV SOLN
INTRAVENOUS | Status: AC | PRN
  Administered 2015-03-07: 30 mL/h via INTRAVENOUS

## 2015-03-07 MED ORDER — MIDAZOLAM HCL 2 MG/2ML IJ SOLN
INTRAMUSCULAR | Status: AC | PRN
  Administered 2015-03-07: 0.5 mg via INTRAVENOUS
  Administered 2015-03-07: 1 mg via INTRAVENOUS
  Administered 2015-03-07 (×2): 0.5 mg via INTRAVENOUS

## 2015-03-07 MED ORDER — CEFAZOLIN SODIUM 1-5 GM-% IV SOLN
1.0000 g | Freq: Once | INTRAVENOUS | Status: AC
  Administered 2015-03-07: 1 g via INTRAVENOUS

## 2015-03-07 MED ORDER — FENTANYL CITRATE (PF) 100 MCG/2ML IJ SOLN
INTRAMUSCULAR | Status: AC | PRN
  Administered 2015-03-07: 50 ug via INTRAVENOUS
  Administered 2015-03-07 (×3): 25 ug via INTRAVENOUS

## 2015-03-07 MED ORDER — GELATIN ABSORBABLE 12-7 MM EX MISC
CUTANEOUS | Status: AC
Start: 2015-03-07 — End: 2015-03-08
  Filled 2015-03-07: qty 1

## 2015-03-07 MED ORDER — LIDOCAINE-EPINEPHRINE 1 %-1:100000 IJ SOLN
INTRAMUSCULAR | Status: AC
  Filled 2015-03-07: qty 1

## 2015-03-07 MED ORDER — MIDAZOLAM HCL 2 MG/2ML IJ SOLN
INTRAMUSCULAR | Status: AC
Start: 2015-03-07 — End: 2015-03-08
  Filled 2015-03-07: qty 4

## 2015-03-07 NOTE — Sedation Documentation (Addendum)
O2 2l/[place] started, prep underway.

## 2015-03-07 NOTE — Care Management Important Message (Signed)
Important Message  Patient Details  Name: RHEANNON CERNEY MRN: [id number] Date of Birth: September 16, 1945   Medicare Important Message Given:  [facility] notification given    Nathen May 03/07/2015, 2:14 [place] Message  Patient Details  Name: SADEEL FIDDLER MRN: [id number] Date of Birth: 11-22-1945   Medicare Important Message Given:  Yes-second notification given    Nathen May 03/07/2015, 2:14 PM

## 2015-03-07 NOTE — Sedation Documentation (Signed)
Gelfoam inserted by Dr Pascal Lux

## 2015-03-07 NOTE — Progress Notes (Signed)
UR Completed. Alyn Riedinger, RN, BSN.  [phone] 

## 2015-03-07 NOTE — Progress Notes (Addendum)
ANTICOAGULATION CONSULT NOTE -follow up  Pharmacy Consult for tirofiban Indication: recent DES off antiplatelet for biopsy  Allergies  Allergen Reactions  . Boniva [Ibandronic Acid]     DIARRHEA   . Delsym [Dextromethorphan] Diarrhea  . Erythromycin     INTERACTION WITH TEGRETOL AND SIDE EFFECTS   . Fosamax [Alendronate Sodium] Diarrhea  . Glucosamine Forte [Nutritional Supplements]     VARIOCELES IN MOUTH AND SIDE EFFECTS   . Guaifenesin & Derivatives     SLEEPY AND SIDE EFFECTS  . Lactose Intolerance (Gi) Diarrhea    Intolerance, gas  . Nexium [Esomeprazole Magnesium]     DIZZINESS  . Tegretol [Carbamazepine] Other (See Comments)    Can only take BRAND, no generic (causes dizziness, heightens side effects because it releases more of the drug, does not keep blood levels steady)  . Zostavax [Zoster Vaccine Live]     DIZZINESS, HEADACHES, DYSPHAGIA    Patient Measurements: Height: 5\' 3"  (160 cm) Weight: 113 lb 9.6 oz (51.529 kg) IBW/kg (Calculated) : 52.4 Dosing Weight: 51.5 kg  Vital Signs: Temp: 98.6 F (37 C) (07/25 0436) Temp Source: Oral (07/25 0436) BP: 152/70 mmHg (07/25 0604) Pulse Rate: 74 (07/25 0604)  Labs:  Recent Labs  03/05/15 0330 03/06/15 0320 03/07/15 0355  HGB 11.3* 12.1 11.3*  HCT 33.2* 36.2 33.3*  PLT 165 179 184  CREATININE 0.60  --   --     Estimated Creatinine Clearance: 55.5 mL/min (by C-G formula based on Cr of 0.6).   Assessment: 69 yo female admitted for liver biopsy. Recently had [place] cath on 02/07/15, received DES x2 to LAD and x1 to RCA. On ASA, atorvastatin, and Brilinta PTA. Part of TWILIGHT research study.  Pharmacy consulted to dose tirofiban.  Last dose of Brilinta 7/19 at 2030. Holding Brilinta and starting tirofiban for liver biopsy. H/H and plt stable.  Given renal function, wt, and PTA Brilinta use, will start tirofiban 0.075 mcg/kg/min (dose adjusted for renal functio and weight) CrCl 61ml/min  using actual body wt  (51.5 kg).    Hgb 11.3, Plt 184, sCr 0.6.  Goal of Therapy:  Monitor platelets by anticoagulation protocol: Yes   Plan:  Continue tirofiban 0.075 mcg/kg/min - to be held 2 hours prior to procedure F/u to resume brilinta post-procedures Monitor CBC daily F/u stop time for procedures today Her 5 day course of levaquin ended 7/24 - FYI. F/u LFTs on atorva 80    Eudelia Bunch, Pharm.D. [phone] 03/07/2015 8:26 AM   Addendum: discussed with Dr Pascal Lux:  Plan: resume tirofiban tonight at 1800, if no bleeding overnight, OK to resume brilinta tomorrow morning. Eudelia Bunch, Pharm.D. [phone] 03/07/2015 3:35 PM

## 2015-03-07 NOTE — Procedures (Signed)
Successful placement of right IJ approach port-a-cath with tip at the superior caval atrial junction. The catheter is ready for immediate use. Technically successful US guided biopsy of dominant indeterminate lesion within the left lobe of the liver.  No immediate post procedural complications.

## 2015-03-07 NOTE — Sedation Documentation (Signed)
Floor updated about post op pain.  Pain down to 0

## 2015-03-07 NOTE — Sedation Documentation (Signed)
Dr Pascal Lux in again for liver biopsy

## 2015-03-07 NOTE — Sedation Documentation (Signed)
Prepping for liver biopsy

## 2015-03-07 NOTE — Sedation Documentation (Signed)
Dr Pascal Lux in again, scanned with [place], Fentanyl 25 given for pain, repositioned.

## 2015-03-07 NOTE — Sedation Documentation (Signed)
O2 d/c'd 

## 2015-03-07 NOTE — Progress Notes (Signed)
    Subjective:  No chest pain  Objective:  Vital Signs in the last 24 hours: Temp:  [98.4 F (36.9 C)-98.7 F (37.1 C)] 98.6 F (37 C) (07/25 0436) Pulse Rate:  [74-82] 74 (07/25 0604) Resp:  [16-18] 16 (07/25 0436) BP: (119-168)/(57-71) 152/70 mmHg (07/25 0604) SpO2:  [98 %-100 %] 100 % (07/25 0436)  Intake/Output from previous day:  Intake/Output Summary (Last 24 hours) at 03/07/15 0921 Last data filed at 03/06/15 1700  Gross per 24 hour  Intake    720 ml  Output      0 ml  Net    720 ml    Physical Exam: General appearance: alert, cooperative and no distress Lungs: clear to auscultation bilaterally Heart: regular rate and rhythm   Rate: 74  Rhythm: normal sinus rhythm  Lab Results:  Recent Labs  03/06/15 0320 03/07/15 0355  WBC 10.6* 11.6*  HGB 12.1 11.3*  PLT 179 184    Recent Labs  03/05/15 0330  NA 135  K 4.9  CL 102  CO2 27  GLUCOSE 120*  BUN 7  CREATININE 0.60   No results for input(s): TROPONINI in the last 72 hours.  Invalid input(s): CK, MB No results for input(s): INR in the last 72 hours.  Scheduled Meds: . aspirin  81 mg Oral Daily  . atorvastatin  80 mg Oral q1800  . carbamazepine  200 mg Oral Daily  . carbamazepine  400 mg Oral QHS  . dexamethasone  2 mg Oral BID WC  . feeding supplement  1 Container Oral Q24H  . lipase/protease/amylase  12,000 Units Oral TID AC  . losartan  25 mg Oral Daily  . metoprolol tartrate  12.5 mg Oral BID  . sodium chloride  3 mL Intravenous Q12H   Continuous Infusions: . tirofiban 0.075 mcg/kg/min (03/06/15 1829)   PRN Meds:.sodium chloride, acetaminophen, ALPRAZolam, alum & mag hydroxide-simeth, fluticasone, lactase, nitroGLYCERIN, ondansetron (ZOFRAN) IV, oxyCODONE, polyethylene glycol, sodium chloride, zolpidem   Imaging: Dg Chest 2 View  03/06/2015   CLINICAL DATA:  Cough for 2 days  EXAM: CHEST  2 VIEW  COMPARISON:  Chest radiograph 02/18/2015  FINDINGS: The heart size and mediastinal  contours are within normal limits. Both lungs are clear. The visualized skeletal structures are unremarkable.  IMPRESSION: No active cardiopulmonary disease.   Electronically Signed   By: Conchita Paris M.D.   On: 03/06/2015 10:42    Cardiac Studies:  Assessment/Plan:   Principal Problem:   Metastasis to liver with unknown primary site Active Problems:   CAD S/P RCA, LAD DES-02/07/15   Protein calorie malnutrition   Dyslipidemia   Essential hypertension   Liver cancer   PLAN: Per IR-liver biopsy and port placement today. OK for her to have meds with a sip of water this am. IR will resume Aggrastat when they feel it is safe.   Kerin Ransom PA-C 03/07/2015, 9:21 AM [phone]  I have examined the patient and reviewed assessment and plan and discussed with patient.  Agree with above as stated.  Patient with persistent dry cough.  Liver biopsy later today.   Kenyata Guess S.

## 2015-03-07 NOTE — Progress Notes (Signed)
Chief Complaint: Patient was seen in consultation today for port placement and liver biopsy at the request of Dr. Irene Limbo  Referring Physician(s): Dr. Irene Limbo  History of Present Illness: Judy Cummings is a 69 y.o. female with ongoing workup for presumed metastatic process. Pt found to have numerous hypermetabolic liver lesions and lymphadenopathy. She is scheduled for liver lesion biopsy and port placement. She also ahs severe CAD and recently underwent PTCA with drug eluting stent. She has been on Brilinta. Cardiology team has admitted pt and converted pt from Brilinta to [place] IV drip in preparation for procedures. Pt feels ok, c/o mild URI sxs. Had low grade fever last week, but none in last 72+ hours. Denies CP, SOB. PMHx, meds, labs, imaging reviewed. Pt has been NPO x meds this am  Past Medical History  Diagnosis Date  . Osteopenia   . Hypercholesterolemia   . Menopause   . Rosacea   . Lactose intolerance   . TMJ (dislocation of temporomandibular joint)   . Fatigue   . Rosacea   . TMJ (dislocation of temporomandibular joint)   . Mild aortic stenosis     echo 01/2015  . Coronary artery disease   . Family history of adverse reaction to anesthesia     "my mother gets PONV"  . GERD (gastroesophageal reflux disease)   . Epileptic seizure     "controlled; started w/my periods; last one was years ago" (02/07/2015)  . Sleep disorder   . 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 CA 19-9 levels On 02/21/2015 were noted to be 11,800 making pancreatic adenocarcinoma the likely primary site.     Past Surgical History  Procedure Laterality Date  . Tonsillectomy    . Coronary angioplasty    . Ankle fracture surgery Left ~ 1977    "put 4 pins in"  . Hemangioma excision  ~ 2006    "inside my mouth"  . Hemangioma w/  laser excision  "yearly since ~ 2006"    "inside my mouth"  . Cardiac catheterization N/A 02/07/2015    Procedure: Left Heart Cath and Coronary Angiography;  Surgeon: Burnell Blanks, MD;  Location: [facility];  Service: Cardiovascular;  Laterality: N/A;  . Cardiac catheterization N/A 02/07/2015    Procedure: Coronary Stent Intervention;  Surgeon: Burnell Blanks, MD;  Location: [facility];  Service: Cardiovascular;  Laterality: N/A;    Allergies: Boniva; Delsym; Erythromycin; Fosamax; Glucosamine forte; Guaifenesin & derivatives; Lactose intolerance (gi); Nexium; Tegretol; and Zostavax  Medications:  Current facility-administered medications:  .  0.9 %  sodium chloride infusion, 250 mL, Intravenous, PRN, [name], PA-C .  acetaminophen (TYLENOL) tablet 650 mg, 650 mg, Oral, Q4H PRN, [name], PA-C, 650 mg at 03/06/15 0323 .  ALPRAZolam (XANAX) tablet 0.25 mg, 0.25 mg, Oral, BID PRN, [name], PA-C .  alum & mag hydroxide-simeth (MAALOX/MYLANTA) 200-200-20 MG/5ML suspension 30 mL, 30 mL, Oral, Q4H PRN, Thayer Headings, MD, 30 mL at 03/06/15 2006 .  aspirin chewable tablet 81 mg, 81 mg, Oral, Daily, [name], MD, 81 mg at 03/06/15 1026 .  atorvastatin (LIPITOR) tablet 80 mg, 80 mg, Oral, q1800, [employer], PA-C, 80 mg at 03/06/15 1700 .  carbamazepine (TEGRETOL XR) 12 hr tablet 200 mg, 200 mg, Oral, Daily, Doreene Burke  Kilroy, PA-C, 200 mg at 03/06/15 1024 .  carbamazepine (TEGRETOL XR) 12 hr tablet 400 mg, 400 mg, Oral, QHS, Doreene Burke [place], PA-C, 400 mg at 03/06/15 2120 .  dexamethasone (DECADRON) tablet 2 mg, 2 mg, Oral, BID WC, Luke K Earl, PA-C, 2 mg at 03/06/15 1656 .  feeding supplement (BOOST / RESOURCE BREEZE) liquid 1 Container, 1 Container, Oral, Q24H, Ardeen Garland, RD, 1 Container at 03/06/15 2121 .  fluticasone (FLONASE) 50 MCG/ACT nasal spray 1 spray, 1 spray, Each Nare, Daily PRN, [name], PA-C .  lactase (LACTAID) tablet  3,000 Units, 1 tablet, Oral, Daily PRN, [name], PA-C .  lipase/protease/amylase (CREON) capsule 12,000 Units, 12,000 Units, Oral, TID AC, [employer], PA-C, 12,000 Units at 03/06/15 1656 .  losartan (COZAAR) tablet 25 mg, 25 mg, Oral, Daily, Scott T Weaver, PA-C, 25 mg at 03/06/15 1026 .  metoprolol tartrate (LOPRESSOR) tablet 12.5 mg, 12.5 mg, Oral, BID, [name], PA-C, 12.5 mg at 03/06/15 2120 .  nitroGLYCERIN (NITROSTAT) SL tablet 0.4 mg, 0.4 mg, Sublingual, Q5 Min x 3 PRN, Luke K Kilroy, PA-C .  ondansetron [facility]) injection 4 mg, 4 mg, Intravenous, Q6H PRN, [name], PA-C, 4 mg at 03/05/15 0836 .  oxyCODONE (Oxy IR/ROXICODONE) immediate release tablet 5 mg, 5 mg, Oral, Q4H PRN, [name], PA-C, 5 mg at 03/03/15 0818 .  polyethylene glycol (MIRALAX / GLYCOLAX) packet 17 g, 17 g, Oral, Daily PRN, [name], MD, 17 g at 03/05/15 2035 .  sodium chloride 0.9 % injection 3 mL, 3 mL, Intravenous, Q12H, [employer], PA-C, 3 mL at 03/03/15 0957 .  sodium chloride 0.9 % injection 3 mL, 3 mL, Intravenous, PRN, [name], PA-C .  tirofiban (AGGRASTAT) infusion 50 mcg/mL 250 mL, 0.075 mcg/kg/min, Intravenous, Continuous, [name], MD, Last Rate: 4.6 mL/hr at 03/06/15 1829, 0.075 mcg/kg/min at 03/06/15 1829 .  zolpidem (AMBIEN) tablet 5 mg, 5 mg, Oral, QHS PRN, [name], PA-C, 5 mg at 03/04/15 2212    Family History  Problem Relation Age of Onset  . Diabetes Mellitus I Mother   . Hypertension Mother   . Atrial fibrillation Mother   . Heart attack Father   . CVA Father   . CAD Father   . Hyperlipidemia Sister   . Heart attack Maternal Grandfather   . CVA Paternal Grandmother   . Colon cancer Paternal Grandfather     History   Social History  . Marital Status: Married    Spouse Name: gilbert  . Number of Children: 2  . Years of Education: college   Occupational History  . retired    Social History Main Topics  . Smoking status: Never Smoker     . Smokeless tobacco: Never Used  . Alcohol Use: No  . Drug Use: No  . Sexual Activity: Not on file   Other Topics Concern  . None   Social History Narrative     Review of Systems: A ROS discussed and pertinent positives are indicated in the HPI above.  All other systems are negative.  Review of Systems  Constitutional: Negative.   HENT: Positive for congestion and sinus pressure. Negative for facial swelling, hearing loss, sneezing, sore throat and tinnitus.   Eyes: Negative.   Respiratory: Positive for cough. Negative for chest tightness, shortness of breath and wheezing.   Cardiovascular: Negative.   Gastrointestinal: Negative.   Genitourinary: Negative.   Musculoskeletal: Negative.  Neurological: Negative.   Psychiatric/Behavioral: Negative.     Vital Signs: BP 152/70 mmHg  Pulse 74  Temp(Src) 98.6 F (37 C) (Oral)  Resp 16  Ht 5\' 3"  (1.6 m)  Wt 113 lb 9.6 oz (51.529 kg)  BMI 20.13 kg/m2  SpO2 100%  Physical Exam  Constitutional: She is oriented to person, place, and time. She appears well-developed and well-nourished. No distress.  HENT:  Head: Normocephalic and atraumatic.  Mouth/Throat: Oropharynx is clear and moist.  Neck: Normal range of motion. No JVD present. No tracheal deviation present.  Cardiovascular: Normal rate, regular rhythm and normal heart sounds.   No murmur heard. Pulmonary/Chest: Effort normal and breath sounds normal. No respiratory distress. She has no wheezes.  Abdominal: Soft. She exhibits no mass. There is no tenderness.  Neurological: She is alert and oriented to person, place, and time.  Psychiatric: She has a normal mood and affect. Judgment normal.    Imaging: Dg Chest 2 View  03/06/2015   CLINICAL DATA:  Cough for 2 days  EXAM: CHEST  2 VIEW  COMPARISON:  Chest radiograph 02/18/2015  FINDINGS: The heart size and mediastinal contours are within normal limits. Both lungs are clear. The visualized skeletal structures are  unremarkable.  IMPRESSION: No active cardiopulmonary disease.   Electronically Signed   By: Conchita Paris M.D.   On: 03/06/2015 10:42   Dg Chest 2 View  02/18/2015   CLINICAL DATA:  Status post cardiac stent placement 1 month ago. It dyspnea on exertion and chest palpitations.  EXAM: CHEST  2 VIEW  COMPARISON:  None.  FINDINGS: Lungs are clear. Heart size is normal. No pneumothorax or pleural effusion. Coronary artery stent in the LAD is noted. No pneumothorax or pleural effusion.  IMPRESSION: No acute disease.   Electronically Signed   By: Inge Rise M.D.   On: 02/18/2015 11:40   Nm Pet Image Initial (pi) Skull Base To Thigh  02/28/2015   CLINICAL DATA:  Initial treatment strategy for hepatic metastatic disease of unknown origin.  EXAM: NUCLEAR MEDICINE PET SKULL BASE TO THIGH  TECHNIQUE: 5.96 mCi F-18 FDG was injected intravenously. Full-ring PET imaging was performed from the skull base to thigh after the radiotracer. CT data was obtained and used for attenuation correction and anatomic localization.  FASTING BLOOD GLUCOSE:  Value: 111 mg/dl  COMPARISON:  Abdominal CT 02/15/2015.  FINDINGS: NECK  No hypermetabolic cervical lymph nodes are identified.There are no lesions of the pharyngeal mucosal space. Dense right thyroid calcification noted.  CHEST  There is minimal hypermetabolic activity in the left hilar and subcarinal regions without apparent corresponding enlarged lymph nodes. This activity has an SUV max of 3.7 and 4.1, respectively. There is also mildly increased activity in the lower right parasternal region with an SUV max of 3.3. This may correspond with a pleural lesion or internal mammary lymph node, although no discretely enlarged lymph node identified. There is no suspicious pulmonary activity.  ABDOMEN/PELVIS  As demonstrated on CT, there is widespread hypermetabolic tumor throughout the liver involving all segments. There are confluent hypermetabolic components within the left lobe  (SUV max 13.4) and posteriorly in the right lobe (SUV max 10.5). There are hypermetabolic lymph nodes within the upper abdomen involving the porta hepatis and retroperitoneum (SUV max 7.0). No abnormal metabolic activity seen within the spleen, adrenal glands or pancreas. No bowel lesions identified. There is no pelvic adenopathy.  SKELETON  There is no hypermetabolic activity to suggest osseous metastatic disease. There is  no abnormal activity within the breasts.  IMPRESSION: 1. Widespread neoplastic disease throughout the liver and lymph nodes of the upper abdomen. 2. No definite extrahepatic primary malignancy identified. 3. Potential early nodal metastases within the right internal mammary, subcarinal and left hilar stations. 4. Tissue sampling recommended.   Electronically Signed   By: Richardean Sale M.D.   On: 02/28/2015 10:54   Ct Abd Wo & W Cm  02/15/2015   CLINICAL DATA:  Upper abdominal pain with burning sensation down, weight loss and decreased appetite. Abnormal outside ultrasound. Evaluate for liver mass. Initial encounter.  EXAM: CT ABDOMEN WITHOUT AND WITH CONTRAST  TECHNIQUE: Multidetector CT imaging of the abdomen was performed following the standard protocol before and following the bolus administration of intravenous contrast.  CONTRAST:  100 ml Omnipaque 300.  COMPARISON:  None.  FINDINGS: Lower chest: Clear lung bases. No significant pleural or pericardial effusion. Coronary artery calcifications are noted.  Hepatobiliary: There is widespread hepatic neoplastic disease with multiple peripherally hypervascular lesions. Confluent tumor superiorly in the left hepatic lobe measures up to 7.2 cm transverse on image 23. There is also confluent tumor posteriorly in the right hepatic lobe. A peripherally enhancing lesion lateral to the gallbladder is best seen on the delayed images (series 12), measuring 4.4 x 3.1 cm on image number 25. No underlying morphologic changes of cirrhosis identified. No  evidence of gallbladder wall thickening, gallstones or biliary dilatation.  Pancreas: Unremarkable. No pancreatic ductal dilatation or surrounding inflammatory changes.  Spleen: Normal in size without focal abnormality.  Adrenals/Urinary Tract: Both adrenal glands appear normal.The kidneys appear normal without evidence of urinary tract calculus or hydronephrosis. Bladder not imaged.  Stomach/Bowel: No evidence of bowel wall thickening, distention or surrounding inflammatory change.  Vascular/Lymphatic: There are enlarged lymph nodes within the porta hepatis, measuring 13 mm on images 36 and 38 of series 11. There is an aortocaval node measuring 10 mm on image 46. Mild aortoiliac atherosclerosis noted.  Other: No ascites or peritoneal nodularity identified.  Musculoskeletal: No acute or significant osseous findings.  IMPRESSION: 1. Widespread hepatic metastatic disease. No underlying morphologic changes of cirrhosis are demonstrated to suggest multifocal hepatocellular carcinoma. 2. No primary malignancy identified within the abdomen. The pelvis was not imaged. 3. Probable metastatic adenopathy within the porta hepatis and upper retroperitoneum. 4. The hepatic disease should be amenable to percutaneous tissue sampling. These results will be called to the ordering clinician or representative by the Radiologist Assistant, and communication documented in the PACS or zVision Dashboard.   Electronically Signed   By: Richardean Sale M.D.   On: 02/15/2015 18:49    Labs:  CBC:  Recent Labs  03/04/15 0416 03/05/15 0330 03/06/15 0320 03/07/15 0355  WBC 9.4 9.7 10.6* 11.6*  HGB 11.3* 11.3* 12.1 11.3*  HCT 32.9* 33.2* 36.2 33.3*  PLT 142* 165 179 184    COAGS:  Recent Labs  02/04/15 1034 02/21/15 1659 03/02/15 1513  INR 1.1* 1.12 1.19  APTT 25.9 31  --     BMP:  Recent Labs  02/04/15 1034 02/08/15 0510 02/21/15 1649 03/02/15 1513 03/05/15 0330  NA 137 135 134* 131* 135  K 3.8 4.1 4.9 4.4  4.9  CL 100 101  --  95* 102  CO2 33* 29 25 27 27   GLUCOSE 85 105* 96 127* 120*  BUN 8 <5* 10.7 14 7   CALCIUM 9.1 8.5* 9.7 8.8* 8.8*  CREATININE 0.58 0.56 0.7 0.68 0.60  GFRNONAA  --  >60  --  >  60 >60  GFRAA  --  >60  --  >60 >60    LIVER FUNCTION TESTS:  Recent Labs  02/21/15 1649 03/02/15 1513  BILITOT 0.48 0.5  AST 67* 125*  ALT 39 80*  ALKPHOS 237* 226*  PROT 7.5 6.8  ALBUMIN 3.7 3.4*    TUMOR MARKERS:  Recent Labs  02/21/15 1615 02/21/15 1659  AFPTM 6.8*  --   CEA 10.7*  --   CA199  --  11838.7*    Assessment and Plan: Multiple liver lesions highly suspicious for metastatic process. Plan for US liver lesion biopsy today. D/w Dr. Irene Limbo, would like to proceed with port placement while pt is off anticoagulation. Mildly elevated WBC, pt afebrile and has also been on Decadron past few days. Brilinta has been held appropriately, aggrestat drip will be stopped 2 hrs prior to procedure. Port placement and Liver biopsy procedures explained, including risks, complications, use of sedation.  Thank you for this interesting consult.  I greatly enjoyed meeting Judy Cummings and look forward to participating in their care.  A copy of this report was sent to the requesting provider on this date.  SignedAscencion Dike 03/07/2015, 9:26 AM   I spent a total of 40 minutes in face to face in clinical consultation, greater than 50% of which was counseling/coordinating care for port placement and liver biopsy.

## 2015-03-07 NOTE — Progress Notes (Signed)
Hematology/Oncology Short Note  I met with Judy Cummings on Friday 03/05/2015. She is being managed excellently for her anti-platelet transition to allow for her diagnostic liver biopsy today 03/07/2015.  I discussed with Judy Cummings and Dr Fransico Him (Cardiology) about having a port placed at the same time as the liver biopsy while she is off [place] and both were in agreement.  I talked with the IR PA-C Ascencion Dike this morning and they have been kind enough to accomodate our request for having the port placed during the same time that the patient is down for a liver biopsy.  I will let the cardiology team and IR determine the appropriate timing for re-initiation of her dual anti-platelet therapy post-procedure if no concerning bleeding is noted.  I appreciate the assistance of the cardiology team and IR.  Sumner [place] [phone]

## 2015-03-07 NOTE — Sedation Documentation (Signed)
PAC completed

## 2015-03-07 NOTE — Sedation Documentation (Addendum)
Awaiting transport back to floor. Pt reported RUQ pain with deep breath 8 out of 10.  4 at rest.  Dr Pascal Lux notified, in to see pt.  Repositioned to lying flat with relief of pain.

## 2015-03-08 LAB — CBC
HCT: 30.3 % — ABNORMAL LOW (ref 36.0–46.0)
Hemoglobin: 10.2 g/dL — ABNORMAL LOW (ref 12.0–15.0)
MCH: 31.7 pg (ref 26.0–34.0)
MCHC: 33.7 g/dL (ref 30.0–36.0)
MCV: 94.1 fL (ref 78.0–100.0)
PLATELETS: 179 10*3/uL (ref 150–400)
RBC: 3.22 MIL/uL — ABNORMAL LOW (ref 3.87–5.11)
RDW: 14.9 % (ref 11.5–15.5)
WBC: 10.4 10*3/uL (ref 4.0–10.5)

## 2015-03-08 MED ORDER — TICAGRELOR 90 MG PO TABS
180.0000 mg | ORAL_TABLET | Freq: Once | ORAL | Status: AC
  Administered 2015-03-08: 180 mg via ORAL
  Filled 2015-03-08: qty 2

## 2015-03-08 NOTE — Progress Notes (Signed)
Patient Name: Judy Cummings Date of Encounter: 03/08/2015  Principal Problem:   Metastasis to liver with unknown primary site Active Problems:   Seizures   Dyslipidemia   Essential hypertension   CAD S/P RCA, LAD DES-02/07/15   Protein calorie malnutrition   Cough-ACE changed tyo ARB   Primary Cardiologist: [name]  Patient Profile: 69 yo female w/ hx PAD, CAD w/ LAD/RCA stents, hepatic neoplastic disease, admitted 07/20 for Brilinta washout and Aggrastat pre-bx.   SUBJECTIVE: No chest pain or SOB, feels tired from the procedures  OBJECTIVE Filed Vitals:   03/07/15 1550 03/07/15 1934 03/08/15 0408 03/08/15 0900  BP: 160/66 126/57 161/75 128/61  Pulse: 78 85 73 80  Temp: 98.3 F (36.8 C) 98.1 F (36.7 C) 97.6 F (36.4 C) 98 F (36.7 C)  TempSrc: Oral Oral Oral Oral  Resp: $Remo'18 18 18 18  'IDFKT$ Height:      Weight:      SpO2: 98% 97% 100% 100%    Intake/Output Summary (Last 24 hours) at 03/08/15 0945 Last data filed at 03/08/15 9024  Gross per 24 hour  Intake    240 ml  Output    700 ml  Net   -460 ml   Filed Weights   03/03/15 1200  Weight: 113 lb 9.6 oz (51.529 kg)    PHYSICAL EXAM General: Well developed, well nourished, female in no acute distress. Head: Normocephalic, atraumatic.  Neck: Supple without bruits, JVD not elevated. Lungs:  Resp regular and unlabored, CTA. Site without ecchymosis or edema Heart: RRR, S1, S2, no S3, S4, soft murmur; no rub. Abdomen: Soft, non-tender, non-distended, BS + x 4. Site healing well. Extremities: No clubbing, cyanosis, edema.  Neuro: Alert and oriented X 3. Moves all extremities spontaneously. Psych: Normal affect.  LABS: CBC: Recent Labs  03/07/15 0355 03/08/15 0349  WBC 11.6* 10.4  HGB 11.3* 10.2*  HCT 33.3* 30.3*  MCV 93.8 94.1  PLT 184 179   TELE:   SR     Radiology/Studies: Dg Chest 2 View 03/06/2015   CLINICAL DATA:  Cough for 2 days  EXAM: CHEST  2 VIEW  COMPARISON:  Chest radiograph 02/18/2015   FINDINGS: The heart size and mediastinal contours are within normal limits. Both lungs are clear. The visualized skeletal structures are unremarkable.  IMPRESSION: No active cardiopulmonary disease.   Electronically Signed   By: Conchita Paris M.D.   On: 03/06/2015 10:42   Ir US Guide Bx Asp/drain 03/07/2015   INDICATION: Multiple hypermetabolic liver lesions of uncertain etiology. Please perform ultrasound-guided liver lesion biopsy for tissue diagnostic purposes.  Given history of presumed widely metastatic disease, request made for placement of a port a catheter for durable intravenous access for chemotherapy administration.  Given patient's history of recent mild cardial infarction assisting placement of a coronary stent, both the ultrasound-guided liver lesion biopsy as well as the Port a Catheter placement will be performed simultaneously as to limit the amount of time the patient is without anti coagulation.  EXAM: 1. IMPLANTED PORT A CATH PLACEMENT WITH ULTRASOUND AND FLUOROSCOPIC GUIDANCE 2. ULTRASOUND-GUIDED LIVER LESION BIOPSY  COMPARISON:  PET-CT -02/27/2025  MEDICATIONS: Ancef 2 gm IV; The antibiotic was administered within an appropriate time interval prior to skin puncture.  ANESTHESIA/SEDATION: Versed 1.5 mg IV; Fentanyl 125 mcg IV;  Total Moderate Sedation Time  65  minutes.  CONTRAST:  None  FLUOROSCOPY TIME:  36 seconds (4.3 mGy)  COMPLICATIONS: None immediate  PROCEDURE: The procedure, risks,  benefits, and alternatives were explained to the patient. Questions regarding the procedure were encouraged and answered. The patient understands and consents to the procedure.  The right neck and chest were prepped with chlorhexidine in a sterile fashion, and a sterile drape was applied covering the operative field. Maximum barrier sterile technique with sterile gowns and gloves were used for the procedure. A timeout was performed prior to the initiation of the procedure. Local anesthesia was provided  with 1% lidocaine with epinephrine.  After creating a small venotomy incision, a micropuncture kit was utilized to access the internal jugular vein under direct, real-time ultrasound guidance. Ultrasound image documentation was performed. The microwire was kinked to measure appropriate catheter length.  A subcutaneous port pocket was then created along the upper chest wall utilizing a combination of sharp and blunt dissection. The pocket was irrigated with sterile saline. A single lumen ISP power injectable port was chosen for placement. The 8 Fr catheter was tunneled from the port pocket site to the venotomy incision. The port was placed in the pocket. The external catheter was trimmed to appropriate length. At the venotomy, an 8 Fr peel-away sheath was placed over a guidewire under fluoroscopic guidance. The catheter was then placed through the sheath and the sheath was removed. Final catheter positioning was confirmed and documented with a fluoroscopic spot radiograph. The port was accessed with a Huber needle, aspirated and flushed with heparinized saline.  The venotomy site was closed with an interrupted 4-0 Vicryl suture. The port pocket incision was closed with interrupted 2-0 Vicryl suture and the skin was opposed with a running subcuticular 4-0 Vicryl suture. Dermabond and Steri-strips were applied to both incisions. Dressings were placed.  Attention was now paid towards the ultrasound-guided liver lesion biopsy.  Ultrasound scanning was performed of the right upper abdominal quadrant demonstrates multiple ill-defined hypoechoic liver lesions compatible with the known extensive hepatic metastatic disease demonstrated on preceding PET-CT. Note was made of a small amount of intra-abdominal ascites, likely progressed since recently performed PET-CT.  A dominant approximately 1.5 x 1.5 cm nodule within the medial segment of the left lobe of the liver was targeted for biopsy given lesion location and sonographic  window. The procedure was planned. The right upper abdominal quadrant was prepped and draped in the usual sterile fashion. The overlying soft tissues were anesthetized with 1% lidocaine with epinephrine. A 17 gauge, 6.8 cm co-axial needle was advanced into a peripheral aspect of the lesion. This was followed by 4 core biopsies with an 18 gauge core device under direct ultrasound guidance. Multiple ultrasound images were saved for documentation purposes.  The coaxial needle track was embolized with a small amount of Gel-Foam slurry. The co-axial needle was removed and hemostasis was obtained with manual compression. Post procedural scanning was negative for definitive area of hemorrhage or additional complication. A dressing was placed. The patient tolerated the procedure well without immediate post procedural complication.  The patient tolerated the procedure well without immediate post procedural complication.  FINDINGS: After catheter placement, the tip lies within the superior cavoatrial junction. The catheter aspirates and flushes normally and is ready for immediate use.  Ultrasound images demonstrate placement of the 18 gauge core needle biopsy device within the central aspect of the dominant targeted lesion within the medial segment of the left lobe of the liver.  IMPRESSION: 1. Successful placement of a right internal jugular approach power injectable Port-A-Cath. The catheter is ready for immediate use. 2. Technically successful ultrasound-guided biopsy of dominant lesion  within the medial segment of the left lobe of the liver.   Electronically Signed   By: Sandi Mariscal M.D.   On: 03/07/2015 16:35     Current Medications:  . aspirin  81 mg Oral Daily  . atorvastatin  80 mg Oral q1800  . carbamazepine  200 mg Oral Daily  . carbamazepine  400 mg Oral QHS  . dexamethasone  2 mg Oral BID WC  . feeding supplement  1 Container Oral Q24H  . lipase/protease/amylase  12,000 Units Oral TID AC  . losartan   25 mg Oral Daily  . metoprolol tartrate  12.5 mg Oral BID  . sodium chloride  3 mL Intravenous Q12H   . tirofiban 0.075 mcg/kg/min (03/07/15 1801)    ASSESSMENT AND PLAN: Principal Problem:   Metastasis to liver with unknown primary site - s/p bx by IR 07/25, path pending - do not see timing of resuming Brilinta in any of the notes.  Otherwise, continue home rx. Dc when medically stable and back on Brilinta. Active Problems:   Seizures   Dyslipidemia   Essential hypertension   CAD S/P RCA, LAD DES-02/07/15   Protein calorie malnutrition   Cough-ACE changed tyo ARB   Signed, Barrett, Rhonda , PA-C 9:45 AM 03/08/2015   I have examined the patient and reviewed assessment and plan and discussed with patient.  Agree with above as stated.  Doing well.  Spoke to Dr. Pascal Lux from radiology. Since she is tolerating the IV aggrastat, ok to switch to PO Brilinta. Will order Brilinta and stop aggrastat.  Possible d/c later today.   Manjit Bufano S.

## 2015-03-09 ENCOUNTER — Other Ambulatory Visit: Payer: Medicare PPO

## 2015-03-09 LAB — CBC
HEMATOCRIT: 32.9 % — AB (ref 36.0–46.0)
Hemoglobin: 11.2 g/dL — ABNORMAL LOW (ref 12.0–15.0)
MCH: 32.2 pg (ref 26.0–34.0)
MCHC: 34 g/dL (ref 30.0–36.0)
MCV: 94.5 fL (ref 78.0–100.0)
PLATELETS: 208 10*3/uL (ref 150–400)
RBC: 3.48 MIL/uL — ABNORMAL LOW (ref 3.87–5.11)
RDW: 15 % (ref 11.5–15.5)
WBC: 10.3 10*3/uL (ref 4.0–10.5)

## 2015-03-09 MED ORDER — BOOST / RESOURCE BREEZE PO LIQD: 1.0000 | ORAL | Status: DC

## 2015-03-09 MED ORDER — TICAGRELOR 90 MG PO TABS
90.0000 mg | ORAL_TABLET | Freq: Two times a day (BID) | ORAL | Status: DC
  Administered 2015-03-09: 90 mg via ORAL
  Filled 2015-03-09 (×2): qty 1

## 2015-03-09 MED ORDER — LOSARTAN POTASSIUM 25 MG PO TABS: 25.0000 mg | ORAL_TABLET | Freq: Every day | ORAL | Status: AC

## 2015-03-09 NOTE — Discharge Summary (Signed)
CARDIOLOGY DISCHARGE SUMMARY   Patient ID: Judy Cummings MRN: [id number] DOB/AGE: [birth year] 69 y.o.  Admit date: 03/02/2015 Discharge date: 03/09/2015  PCP: Shirline Frees, MD Primary Cardiologist: Dr. Radford Pax  Primary Discharge Diagnosis:  Metastasis to the liver, unknown primary site Secondary Discharge Diagnosis:    Seizures   Dyslipidemia   Essential hypertension   CAD S/P RCA, LAD DES-02/07/15   Protein calorie malnutrition   Cough-ACE changed tyo ARB  Consults: Interventional Radiology  Procedures: Insertion of a Port-A-Cath, liver biopsy  Hospital Course: Judy Cummings is a 69 y.o. female with a history of PAD, CAD with recent LAD/RCA stents, and also a recent diagnosis of hepatic neoplastic disease. Because of the recent stenting, it was not considered safe to have her go off her antiplatelets therapies for biopsy. However it was also important to go ahead and get the biopsy so she can start treatment. Therefore, the decision was made to bring her in for Brilinta washout and Aggrastat IV therapy in the period before and after the biopsy. She came to the hospital on 03/02/2015.  The Brilinta was held and Aggrastat was started performed see instructions. By 03/07/2015, she had been off the Brilinta long enough that the Aggrastat could be held for the procedure. To keep from having to do this twice, she had the liver biopsy and Port-A-Cath placed during the same procedure. She tolerated this well. She was started on Aggrastat after the procedure.  Of note, on admission she had been treated for an upper respiratory infection with Levaquin. However, after completing the antibiotics she continued to cough despite a normal white count and no fever. There was consideration that the ACE inhibitor was causing the cough, so it was discontinued and she was started on an ARB.  She had some problems with pain control initially but then did not better. On 03/08/2015, she was restarted on the  Brilinta with a loading dose. On 03/09/2015, she had tolerated the Brilinta loading dose and was having no bleeding issues. Her pain was controlled and there was no signs of infection or other complication at the sites. She had no chest pain or shortness of breath. No further inpatient workup is indicated and she is considered stable for discharge, to follow-up as an outpatient.  Labs:   Lab Results  Component Value Date   WBC 10.3 03/09/2015   HGB 11.2* 03/09/2015   HCT 32.9* 03/09/2015   MCV 94.5 03/09/2015   PLT 208 03/09/2015     Recent Labs Lab 03/02/15 1513 03/05/15 0330  NA 131* 135  K 4.4 4.9  CL 95* 102  CO2 27 27  BUN 14 7  CREATININE 0.68 0.60  CALCIUM 8.8* 8.8*  PROT 6.8  --   BILITOT 0.5  --   ALKPHOS 226*  --   ALT 80*  --   AST 125*  --   GLUCOSE 127* 120*     Radiology: Dg Chest 2 View  03/06/2015   CLINICAL DATA:  Cough for 2 days  EXAM: CHEST  2 VIEW  COMPARISON:  Chest radiograph 02/18/2015  FINDINGS: The heart size and mediastinal contours are within normal limits. Both lungs are clear. The visualized skeletal structures are unremarkable.  IMPRESSION: No active cardiopulmonary disease.   Electronically Signed   By: Conchita Paris M.D.   On: 03/06/2015 10:42   Dg Chest 2 View  02/18/2015   CLINICAL DATA:  Status post cardiac stent placement 1 month ago. It dyspnea  on exertion and chest palpitations.  EXAM: CHEST  2 VIEW  COMPARISON:  None.  FINDINGS: Lungs are clear. Heart size is normal. No pneumothorax or pleural effusion. Coronary artery stent in the LAD is noted. No pneumothorax or pleural effusion.  IMPRESSION: No acute disease.   Electronically Signed   By: Inge Rise M.D.   On: 02/18/2015 11:40   Ir US Guide Bx Asp/drain 03/07/2015   INDICATION: Multiple hypermetabolic liver lesions of uncertain etiology. Please perform ultrasound-guided liver lesion biopsy for tissue diagnostic purposes.  Given history of presumed widely metastatic disease,  request made for placement of a port a catheter for durable intravenous access for chemotherapy administration.  Given patient's history of recent mild cardial infarction assisting placement of a coronary stent, both the ultrasound-guided liver lesion biopsy as well as the Port a Catheter placement will be performed simultaneously as to limit the amount of time the patient is without anti coagulation.  EXAM: 1. IMPLANTED PORT A CATH PLACEMENT WITH ULTRASOUND AND FLUOROSCOPIC GUIDANCE 2. ULTRASOUND-GUIDED LIVER LESION BIOPSY  COMPARISON:  PET-CT -02/27/2025  MEDICATIONS: Ancef 2 gm IV; The antibiotic was administered within an appropriate time interval prior to skin puncture.  ANESTHESIA/SEDATION: Versed 1.5 mg IV; Fentanyl 125 mcg IV;  Total Moderate Sedation Time  65  minutes.  CONTRAST:  None  FLUOROSCOPY TIME:  36 seconds (4.3 mGy)  COMPLICATIONS: None immediate  PROCEDURE: The procedure, risks, benefits, and alternatives were explained to the patient. Questions regarding the procedure were encouraged and answered. The patient understands and consents to the procedure.  The right neck and chest were prepped with chlorhexidine in a sterile fashion, and a sterile drape was applied covering the operative field. Maximum barrier sterile technique with sterile gowns and gloves were used for the procedure. A timeout was performed prior to the initiation of the procedure. Local anesthesia was provided with 1% lidocaine with epinephrine.  After creating a small venotomy incision, a micropuncture kit was utilized to access the internal jugular vein under direct, real-time ultrasound guidance. Ultrasound image documentation was performed. The microwire was kinked to measure appropriate catheter length.  A subcutaneous port pocket was then created along the upper chest wall utilizing a combination of sharp and blunt dissection. The pocket was irrigated with sterile saline. A single lumen ISP power injectable port was chosen  for placement. The 8 Fr catheter was tunneled from the port pocket site to the venotomy incision. The port was placed in the pocket. The external catheter was trimmed to appropriate length. At the venotomy, an 8 Fr peel-away sheath was placed over a guidewire under fluoroscopic guidance. The catheter was then placed through the sheath and the sheath was removed. Final catheter positioning was confirmed and documented with a fluoroscopic spot radiograph. The port was accessed with a Huber needle, aspirated and flushed with heparinized saline.  The venotomy site was closed with an interrupted 4-0 Vicryl suture. The port pocket incision was closed with interrupted 2-0 Vicryl suture and the skin was opposed with a running subcuticular 4-0 Vicryl suture. Dermabond and Steri-strips were applied to both incisions. Dressings were placed.  Attention was now paid towards the ultrasound-guided liver lesion biopsy.  Ultrasound scanning was performed of the right upper abdominal quadrant demonstrates multiple ill-defined hypoechoic liver lesions compatible with the known extensive hepatic metastatic disease demonstrated on preceding PET-CT. Note was made of a small amount of intra-abdominal ascites, likely progressed since recently performed PET-CT.  A dominant approximately 1.5 x 1.5 cm nodule within  the medial segment of the left lobe of the liver was targeted for biopsy given lesion location and sonographic window. The procedure was planned. The right upper abdominal quadrant was prepped and draped in the usual sterile fashion. The overlying soft tissues were anesthetized with 1% lidocaine with epinephrine. A 17 gauge, 6.8 cm co-axial needle was advanced into a peripheral aspect of the lesion. This was followed by 4 core biopsies with an 18 gauge core device under direct ultrasound guidance. Multiple ultrasound images were saved for documentation purposes.  The coaxial needle track was embolized with a small amount of  Gel-Foam slurry. The co-axial needle was removed and hemostasis was obtained with manual compression. Post procedural scanning was negative for definitive area of hemorrhage or additional complication. A dressing was placed. The patient tolerated the procedure well without immediate post procedural complication.  The patient tolerated the procedure well without immediate post procedural complication.  FINDINGS: After catheter placement, the tip lies within the superior cavoatrial junction. The catheter aspirates and flushes normally and is ready for immediate use.  Ultrasound images demonstrate placement of the 18 gauge core needle biopsy device within the central aspect of the dominant targeted lesion within the medial segment of the left lobe of the liver.  IMPRESSION: 1. Successful placement of a right internal jugular approach power injectable Port-A-Cath. The catheter is ready for immediate use. 2. Technically successful ultrasound-guided biopsy of dominant lesion within the medial segment of the left lobe of the liver.   Electronically Signed   By: Sandi Mariscal M.D.   On: 03/07/2015 16:35   FOLLOW UP PLANS AND APPOINTMENTS Allergies  Allergen Reactions  . Boniva [Ibandronic Acid]     DIARRHEA   . Delsym [Dextromethorphan] Diarrhea  . Erythromycin     INTERACTION WITH TEGRETOL AND SIDE EFFECTS   . Fosamax [Alendronate Sodium] Diarrhea  . Glucosamine Forte [Nutritional Supplements]     VARIOCELES IN MOUTH AND SIDE EFFECTS   . Guaifenesin & Derivatives     SLEEPY AND SIDE EFFECTS  . Lactose Intolerance (Gi) Diarrhea    Intolerance, gas  . Nexium [Esomeprazole Magnesium]     DIZZINESS  . Tegretol [Carbamazepine] Other (See Comments)    Can only take BRAND, no generic (causes dizziness, heightens side effects because it releases more of the drug, does not keep blood levels steady)  . Zostavax [Zoster Vaccine Live]     DIZZINESS, HEADACHES, DYSPHAGIA     Medication List    STOP taking  these medications        levofloxacin 500 MG tablet  Commonly known as:  LEVAQUIN     lisinopril 5 MG tablet  Commonly known as:  PRINIVIL,ZESTRIL      TAKE these medications        acetaminophen 325 MG tablet  Commonly known as:  TYLENOL  Take 650 mg by mouth every 6 (six) hours as needed for mild pain.     AMBULATORY NON FORMULARY MEDICATION  Take 90 mg by mouth 2 (two) times daily. Medication Name: Brilinta 90 mg BID provided by [employer] Research study (Do Not Fill)     AMBULATORY NON FORMULARY MEDICATION  Take 81 mg by mouth daily. Medication Name: ASA 81 mg daily Provided by [facility] study     atorvastatin 80 MG tablet  Commonly known as:  LIPITOR  Take 1 tablet (80 mg total) by mouth daily at 6 PM.     Azelaic Acid 15 % cream  Apply 1 application topically daily  as needed (for acne and rosacea).     dexamethasone 2 MG tablet  Commonly known as:  DECADRON  Take 1 tablet (2 mg total) by mouth 2 (two) times daily with a meal.     feeding supplement Liqd  Take 1 Container by mouth daily.     fluticasone 50 MCG/ACT nasal spray  Commonly known as:  FLONASE  Place 1 spray into both nostrils daily as needed for allergies.     lactase 3000 UNITS tablet  Commonly known as:  LACTAID  Take 1 tablet by mouth daily as needed (for lactose intolerance).     lipase/protease/amylase 12000 UNITS Cpep capsule  Commonly known as:  CREON  Take 1 capsule (12,000 Units total) by mouth 3 (three) times daily before meals.     losartan 25 MG tablet  Commonly known as:  COZAAR  Take 1 tablet (25 mg total) by mouth daily.     metoprolol tartrate 25 MG tablet  Commonly known as:  LOPRESSOR  Take 0.5 tablets (12.5 mg total) by mouth 2 (two) times daily.     MULTIVITAMIN & MINERAL PO  Take 1 tablet by mouth at bedtime.     ondansetron 8 MG tablet  Commonly known as:  ZOFRAN  Take 1 tablet (8 mg total) by mouth every 8 (eight) hours as needed for nausea or vomiting.     oxyCODONE 5  MG immediate release tablet  Commonly known as:  Oxy IR/ROXICODONE  Take 1 tablet (5 mg total) by mouth every 4 (four) hours as needed for severe pain.     carbamazepine 400 MG 12 hr tablet  Commonly known as:  TEGRETOL XR  Take 400 mg by mouth at bedtime. $RemoveBef'200mg'egJRChltKl$  AM, $Re'400mg'ias$  PM     TEGRETOL-XR 200 MG 12 hr tablet  Generic drug:  carbamazepine  Take 200 mg by mouth daily.     zaleplon 5 MG capsule  Commonly known as:  SONATA  Take 5 mg by mouth at bedtime as needed for sleep.         Follow-up Information    Follow up with [name], MD.   Specialty:  Cardiology   Why:  The office will call.   Contact information:   [address] [place] [address] [phone]       BRING ALL MEDICATIONS WITH YOU TO FOLLOW UP APPOINTMENTS  Time spent with patient to include physician time: 5 min Signed: Rosaria Ferries, PA-C 03/09/2015, 12:36 PM Co-Sign MD  I have examined the patient and reviewed assessment and plan and discussed with patient.  Agree with above as stated.  No bleeding issues. TOlerating Brilinta.  See my note from earlier today.  Denine Brotz S.

## 2015-03-09 NOTE — Progress Notes (Signed)
     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   SUBJECTIVE: No chest pain, tolerating Brilinta well, no SOB  OBJECTIVE Filed Vitals:   03/08/15 1355 03/08/15 2005 03/09/15 0401 03/09/15 1008  BP: 134/44 140/65 158/70 123/61  Pulse: 83 85 79   Temp: 98.4 F (36.9 C) 98.4 F (36.9 C) 98.6 F (37 C) 99.7 F (37.6 C)  TempSrc: Oral Oral Oral Oral  Resp: 20 18 18 18   Height:      Weight:      SpO2: 97% 98% 100% 98%    Intake/Output Summary (Last 24 hours) at 03/09/15 1011 Last data filed at 03/09/15 0700  Gross per 24 hour  Intake    720 ml  Output   2750 ml  Net  -2030 ml   Filed Weights   03/03/15 1200  Weight: 113 lb 9.6 oz (51.529 kg)    PHYSICAL EXAM General: Well developed, well nourished, female in no acute distress. Head: Normocephalic, atraumatic.  Neck: Supple without bruits, JVD not elevated. Lungs:  Resp regular and unlabored, CTA. Heart: RRR, S1, S2, no S3, S4, 2/6 murmur; no rub. Abdomen: Soft, tender at site, non-distended, BS + x 4.  Extremities: No clubbing, cyanosis, edema.  Neuro: Alert and oriented X 3. Moves all extremities spontaneously. Psych: Normal affect.  LABS: CBC: Recent Labs  03/07/15 0355 03/08/15 0349  WBC 11.6* 10.4  HGB 11.3* 10.2*  HCT 33.3* 30.3*  MCV 93.8 94.1  PLT 184 179   TELE:  SR  Radiology/Studies: IMPRESSION: 1. Successful placement of a right internal jugular approach power injectable Port-A-Cath. The catheter is ready for immediate use. 2. Technically successful ultrasound-guided  biopsy of dominant lesion within the medial segment of the left lobe of the liver.   Current Medications:  . aspirin  81 mg Oral Daily  . atorvastatin  80 mg Oral q1800  . carbamazepine  200 mg Oral Daily  . carbamazepine  400 mg Oral QHS  . dexamethasone  2 mg Oral BID WC  . feeding supplement  1 Container Oral Q24H  . lipase/protease/amylase  12,000 Units Oral TID AC  . losartan  25 mg Oral Daily  . metoprolol tartrate  12.5 mg Oral BID  . 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  Otherwise, continue home rx. Dc today. Active Problems:  Seizures  Dyslipidemia  Essential hypertension  CAD S/P RCA, LAD DES-02/07/15  Protein calorie malnutrition  Cough-ACE changed tyo ARB  Signed, Barrett, Rhonda , PA-C 10:11 AM 03/09/2015   I have examined the patient and reviewed assessment and plan and discussed with patient.  Agree with above as stated.  Tolerating Brilinta.  No signs of bleeding. Hgb stable.  OK for discharge with Brilinta restarted.  Aundray Cartlidge S.

## 2015-03-09 NOTE — Care Management Important Message (Signed)
Important Message  Patient Details  Name: MARYLOU WAGES MRN: [id number] Date of Birth: 09/11/1945   Medicare Important Message Given:  Yes-third notification given   Important Message  Patient Details  Name: DANYALE RIDINGER MRN: [id number] Date of Birth: [birth year]   Medicare Important Message Given:  Yes-third notification given    Nathen May 03/09/2015, 12:07 PM Quintasia Theroux Abena 03/09/2015, 12:07 PM

## 2015-03-09 NOTE — Progress Notes (Signed)
Pt D/C home per MD order, D/C instructions reviewed with pt and spouse, all questions answered. Pt aware of follow up appt., Pt  Instructed to pick up his prescription for cozaar at [facility]. Peripheral iv removed and site looks clean and intact, Pt verbalized understanding of discharged instructions.

## 2015-03-10 ENCOUNTER — Ambulatory Visit (HOSPITAL_BASED_OUTPATIENT_CLINIC_OR_DEPARTMENT_OTHER): Payer: Medicare PPO

## 2015-03-10 ENCOUNTER — Encounter: Payer: Self-pay | Admitting: *Deleted

## 2015-03-10 ENCOUNTER — Encounter: Payer: Self-pay | Admitting: Hematology

## 2015-03-10 ENCOUNTER — Telehealth: Payer: Self-pay | Admitting: Hematology

## 2015-03-10 ENCOUNTER — Ambulatory Visit (HOSPITAL_BASED_OUTPATIENT_CLINIC_OR_DEPARTMENT_OTHER): Payer: Medicare PPO | Admitting: Hematology

## 2015-03-10 ENCOUNTER — Telehealth: Payer: Self-pay | Admitting: *Deleted

## 2015-03-10 VITALS — BP 130/78 | HR 90 | Temp 98.8°F | Resp 20 | Ht 63.0 in | Wt 116.9 lb

## 2015-03-10 DIAGNOSIS — E46 Unspecified protein-calorie malnutrition: Secondary | ICD-10-CM | POA: Diagnosis not present

## 2015-03-10 DIAGNOSIS — C801 Malignant (primary) neoplasm, unspecified: Secondary | ICD-10-CM

## 2015-03-10 DIAGNOSIS — C259 Malignant neoplasm of pancreas, unspecified: Secondary | ICD-10-CM

## 2015-03-10 DIAGNOSIS — Z5111 Encounter for antineoplastic chemotherapy: Secondary | ICD-10-CM | POA: Diagnosis not present

## 2015-03-10 DIAGNOSIS — C772 Secondary and unspecified malignant neoplasm of intra-abdominal lymph nodes: Secondary | ICD-10-CM

## 2015-03-10 DIAGNOSIS — C787 Secondary malignant neoplasm of liver and intrahepatic bile duct: Secondary | ICD-10-CM

## 2015-03-10 DIAGNOSIS — C799 Secondary malignant neoplasm of unspecified site: Principal | ICD-10-CM

## 2015-03-10 DIAGNOSIS — K30 Functional dyspepsia: Secondary | ICD-10-CM

## 2015-03-10 LAB — CBC & DIFF AND RETIC
BASO%: 0.4 % (ref 0.0–2.0)
Basophils Absolute: 0 10*3/uL (ref 0.0–0.1)
EOS%: 10.4 % — ABNORMAL HIGH (ref 0.0–7.0)
Eosinophils Absolute: 1.1 10*3/uL — ABNORMAL HIGH (ref 0.0–0.5)
HCT: 31.6 % — ABNORMAL LOW (ref 34.8–46.6)
HGB: 10.8 g/dL — ABNORMAL LOW (ref 11.6–15.9)
IMMATURE RETIC FRACT: 5.9 % (ref 1.60–10.00)
LYMPH#: 0.8 10*3/uL — AB (ref 0.9–3.3)
LYMPH%: 7.2 % — ABNORMAL LOW (ref 14.0–49.7)
MCH: 32 pg (ref 25.1–34.0)
MCHC: 34.2 g/dL (ref 31.5–36.0)
MCV: 93.8 fL (ref 79.5–101.0)
MONO#: 1 10*3/uL — AB (ref 0.1–0.9)
MONO%: 8.9 % (ref 0.0–14.0)
NEUT%: 73.1 % (ref 38.4–76.8)
NEUTROS ABS: 8 10*3/uL — AB (ref 1.5–6.5)
Platelets: 252 10*3/uL (ref 145–400)
RBC: 3.37 10*6/uL — ABNORMAL LOW (ref 3.70–5.45)
RDW: 15 % — ABNORMAL HIGH (ref 11.2–14.5)
RETIC %: 2.1 % (ref 0.70–2.10)
Retic Ct Abs: 70.77 10*3/uL (ref 33.70–90.70)
WBC: 10.9 10*3/uL — ABNORMAL HIGH (ref 3.9–10.3)

## 2015-03-10 LAB — COMPREHENSIVE METABOLIC PANEL (CC13)
ALT: 54 U/L (ref 0–55)
AST: 65 U/L — ABNORMAL HIGH (ref 5–34)
Albumin: 2.9 g/dL — ABNORMAL LOW (ref 3.5–5.0)
Alkaline Phosphatase: 297 U/L — ABNORMAL HIGH (ref 40–150)
Anion Gap: 8 mEq/L (ref 3–11)
BILIRUBIN TOTAL: 0.52 mg/dL (ref 0.20–1.20)
BUN: 12.1 mg/dL (ref 7.0–26.0)
CO2: 25 mEq/L (ref 22–29)
Calcium: 9.4 mg/dL (ref 8.4–10.4)
Chloride: 102 mEq/L (ref 98–109)
Creatinine: 0.6 mg/dL (ref 0.6–1.1)
EGFR: >90 mL/min/{1.73_m2} (ref >90)
GLUCOSE: 134 mg/dL (ref 70–140)
POTASSIUM: 3.9 meq/L (ref 3.5–5.1)
SODIUM: 134 meq/L — AB (ref 136–145)
Total Protein: 7 g/dL (ref 6.4–8.3)

## 2015-03-10 MED ORDER — SODIUM CHLORIDE 0.9 % IV SOLN
Freq: Once | INTRAVENOUS | Status: AC
  Administered 2015-03-10: 14:00:00 via INTRAVENOUS

## 2015-03-10 MED ORDER — SODIUM CHLORIDE 0.9 % IV SOLN
1000.0000 mg/m2 | Freq: Once | INTRAVENOUS | Status: AC
  Administered 2015-03-10: 1520 mg via INTRAVENOUS
  Filled 2015-03-10: qty 39.98

## 2015-03-10 MED ORDER — HEPARIN SOD (PORK) LOCK FLUSH 100 UNIT/ML IV SOLN
500.0000 [IU] | Freq: Once | INTRAVENOUS | Status: AC | PRN
  Administered 2015-03-10: 500 [IU]
  Filled 2015-03-10: qty 5

## 2015-03-10 MED ORDER — LIDOCAINE-PRILOCAINE 2.5-2.5 % EX KIT: PACK | Freq: Once | CUTANEOUS | Status: DC

## 2015-03-10 MED ORDER — PACLITAXEL PROTEIN-BOUND CHEMO INJECTION 100 MG
125.0000 mg/m2 | Freq: Once | INTRAVENOUS | Status: AC
  Administered 2015-03-10: 200 mg via INTRAVENOUS
  Filled 2015-03-10: qty 40

## 2015-03-10 MED ORDER — SODIUM CHLORIDE 0.9 % IV SOLN
Freq: Once | INTRAVENOUS | Status: AC
  Administered 2015-03-10: 14:00:00 via INTRAVENOUS
  Filled 2015-03-10: qty 4

## 2015-03-10 MED ORDER — SODIUM CHLORIDE 0.9 % IJ SOLN
10.0000 mL | INTRAMUSCULAR | Status: DC | PRN
  Administered 2015-03-10: 10 mL
  Filled 2015-03-10: qty 10

## 2015-03-10 NOTE — Telephone Encounter (Signed)
Per staff message and POF I have scheduled appts. Advised scheduler of appts. JMW  

## 2015-03-10 NOTE — Telephone Encounter (Signed)
per pof to sch pt appt-sent email to MW to sch trmt-will call pt once reply

## 2015-03-10 NOTE — Patient Instructions (Signed)
Gemcitabine injection What is this medicine? GEMCITABINE (jem SIT a been) is a chemotherapy drug. This medicine is used to treat many types of cancer like breast cancer, lung cancer, pancreatic cancer, and ovarian cancer. This medicine may be used for other purposes; ask your health care provider or pharmacist if you have questions. COMMON BRAND NAME(S): Gemzar What should I tell my health care provider before I take this medicine? They need to know if you have any of these conditions: -blood disorders -infection -kidney disease -liver disease -recent or ongoing radiation therapy -an unusual or allergic reaction to gemcitabine, other chemotherapy, other medicines, foods, dyes, or preservatives -pregnant or trying to get pregnant -breast-feeding How should I use this medicine? This drug is given as an infusion into a vein. It is administered in a hospital or clinic by a specially trained health care professional. Talk to your pediatrician regarding the use of this medicine in children. Special care may be needed. Overdosage: If you think you have taken too much of this medicine contact a poison control center or emergency room at once. NOTE: This medicine is only for you. Do not share this medicine with others. What if I miss a dose? It is important not to miss your dose. Call your doctor or health care professional if you are unable to keep an appointment. What may interact with this medicine? -medicines to increase blood counts like filgrastim, pegfilgrastim, sargramostim -some other chemotherapy drugs like cisplatin -vaccines Talk to your doctor or health care professional before taking any of these medicines: -acetaminophen -aspirin -ibuprofen -ketoprofen -naproxen This list may not describe all possible interactions. Give your health care provider a list of all the medicines, herbs, non-prescription drugs, or dietary supplements you use. Also tell them if you smoke, drink alcohol,  or use illegal drugs. Some items may interact with your medicine. What should I watch for while using this medicine? Visit your doctor for checks on your progress. This drug may make you feel generally unwell. This is not uncommon, as chemotherapy can affect healthy cells as well as cancer cells. Report any side effects. Continue your course of treatment even though you feel ill unless your doctor tells you to stop. In some cases, you may be given additional medicines to help with side effects. Follow all directions for their use. Call your doctor or health care professional for advice if you get a fever, chills or sore throat, or other symptoms of a cold or flu. Do not treat yourself. This drug decreases your body's ability to fight infections. Try to avoid being around people who are sick. This medicine may increase your risk to bruise or bleed. Call your doctor or health care professional if you notice any unusual bleeding. Be careful brushing and flossing your teeth or using a toothpick because you may get an infection or bleed more easily. If you have any dental work done, tell your dentist you are receiving this medicine. Avoid taking products that contain aspirin, acetaminophen, ibuprofen, naproxen, or ketoprofen unless instructed by your doctor. These medicines may hide a fever. Women should inform their doctor if they wish to become pregnant or think they might be pregnant. There is a potential for serious side effects to an unborn child. Talk to your health care professional or pharmacist for more information. Do not breast-feed an infant while taking this medicine. What side effects may I notice from receiving this medicine? Side effects that you should report to your doctor or health care professional as   soon as possible: -allergic reactions like skin rash, itching or hives, swelling of the face, lips, or tongue -low blood counts - this medicine may decrease the number of white blood cells,  red blood cells and platelets. You may be at increased risk for infections and bleeding. -signs of infection - fever or chills, cough, sore throat, pain or difficulty passing urine -signs of decreased platelets or bleeding - bruising, pinpoint red spots on the skin, black, tarry stools, blood in the urine -signs of decreased red blood cells - unusually weak or tired, fainting spells, lightheadedness -breathing problems -chest pain -mouth sores -nausea and vomiting -pain, swelling, redness at site where injected -pain, tingling, numbness in the hands or feet -stomach pain -swelling of ankles, feet, hands -unusual bleeding Side effects that usually do not require medical attention (report to your doctor or health care professional if they continue or are bothersome): -constipation -diarrhea -hair loss -loss of appetite -stomach upset This list may not describe all possible side effects. Call your doctor for medical advice about side effects. You may report side effects to FDA at 1-800-FDA-1088. Where should I keep my medicine? This drug is given in a hospital or clinic and will not be stored at home. NOTE: This sheet is a summary. It may not cover all possible information. If you have questions about this medicine, talk to your doctor, pharmacist, or health care provider.  2015, Elsevier/Gold Standard. (2007-12-09 18:45:54)   Nanoparticle Albumin-Bound Paclitaxel injection What is this medicine? NANOPARTICLE ALBUMIN-BOUND PACLITAXEL (Na no PAHR ti kuhl al BYOO muhn-bound PAK li TAX el) is a chemotherapy drug. It targets fast dividing cells, like cancer cells, and causes these cells to die. This medicine is used to treat advanced breast cancer and advanced lung cancer. This medicine may be used for other purposes; ask your health care provider or pharmacist if you have questions. COMMON BRAND NAME(S): Abraxane What should I tell my health care provider before I take this medicine? They  need to know if you have any of these conditions: -kidney disease -liver disease -low blood counts, like low platelets, red blood cells, or white blood cells -recent or ongoing radiation therapy -an unusual or allergic reaction to paclitaxel, albumin, other chemotherapy, other medicines, foods, dyes, or preservatives -pregnant or trying to get pregnant -breast-feeding How should I use this medicine? This drug is given as an infusion into a vein. It is administered in a hospital or clinic by a specially trained health care professional. Talk to your pediatrician regarding the use of this medicine in children. Special care may be needed. Overdosage: If you think you have taken too much of this medicine contact a poison control center or emergency room at once. NOTE: This medicine is only for you. Do not share this medicine with others. What if I miss a dose? It is important not to miss your dose. Call your doctor or health care professional if you are unable to keep an appointment. What may interact with this medicine? -cyclosporine -diazepam -ketoconazole -medicines to increase blood counts like filgrastim, pegfilgrastim, sargramostim -other chemotherapy drugs like cisplatin, doxorubicin, epirubicin, etoposide, teniposide, vincristine -quinidine -testosterone -vaccines -verapamil Talk to your doctor or health care professional before taking any of these medicines: -acetaminophen -aspirin -ibuprofen -ketoprofen -naproxen This list may not describe all possible interactions. Give your health care provider a list of all the medicines, herbs, non-prescription drugs, or dietary supplements you use. Also tell them if you smoke, drink alcohol, or use illegal drugs. Some  items may interact with your medicine. What should I watch for while using this medicine? Your condition will be monitored carefully while you are receiving this medicine. You will need important blood work done while you are  taking this medicine. This drug may make you feel generally unwell. This is not uncommon, as chemotherapy can affect healthy cells as well as cancer cells. Report any side effects. Continue your course of treatment even though you feel ill unless your doctor tells you to stop. In some cases, you may be given additional medicines to help with side effects. Follow all directions for their use. Call your doctor or health care professional for advice if you get a fever, chills or sore throat, or other symptoms of a cold or flu. Do not treat yourself. This drug decreases your body's ability to fight infections. Try to avoid being around people who are sick. This medicine may increase your risk to bruise or bleed. Call your doctor or health care professional if you notice any unusual bleeding. Be careful brushing and flossing your teeth or using a toothpick because you may get an infection or bleed more easily. If you have any dental work done, tell your dentist you are receiving this medicine. Avoid taking products that contain aspirin, acetaminophen, ibuprofen, naproxen, or ketoprofen unless instructed by your doctor. These medicines may hide a fever. Do not become pregnant while taking this medicine. Women should inform their doctor if they wish to become pregnant or think they might be pregnant. There is a potential for serious side effects to an unborn child. Talk to your health care professional or pharmacist for more information. Do not breast-feed an infant while taking this medicine. Men are advised not to father a child while receiving this medicine. What side effects may I notice from receiving this medicine? Side effects that you should report to your doctor or health care professional as soon as possible: -allergic reactions like skin rash, itching or hives, swelling of the face, lips, or tongue -low blood counts - This drug may decrease the number of white blood cells, red blood cells and  platelets. You may be at increased risk for infections and bleeding. -signs of infection - fever or chills, cough, sore throat, pain or difficulty passing urine -signs of decreased platelets or bleeding - bruising, pinpoint red spots on the skin, black, tarry stools, nosebleeds -signs of decreased red blood cells - unusually weak or tired, fainting spells, lightheadedness -breathing problems -changes in vision -chest pain -high or low blood pressure -mouth sores -nausea and vomiting -pain, swelling, redness or irritation at the injection site -pain, tingling, numbness in the hands or feet -slow or irregular heartbeat -swelling of the ankle, feet, hands Side effects that usually do not require medical attention (report to your doctor or health care professional if they continue or are bothersome): -aches, pains -changes in the color of fingernails -diarrhea -hair loss -loss of appetite This list may not describe all possible side effects. Call your doctor for medical advice about side effects. You may report side effects to FDA at 1-800-FDA-1088. Where should I keep my medicine? This drug is given in a hospital or clinic and will not be stored at home. NOTE: This sheet is a summary. It may not cover all possible information. If you have questions about this medicine, talk to your doctor, pharmacist, or health care provider.  2015, Elsevier/Gold Standard. (2012-09-22 16:48:50)

## 2015-03-10 NOTE — Progress Notes (Unsigned)
Oncology Nurse Navigator Documentation  Oncology Nurse Navigator Flowsheets 03/10/2015  Navigator Encounter Type Treatment  Patient Visit Type Medonc  Treatment Phase First Chemo Tx  Barriers/Navigation Needs Education  Education Symptom Management  Interventions Scheduled for missed chemo class;Education Method  Education Method Teach-back  Support Groups/Services GI  Time Spent with Patient 15  Met with patient and husband, [name] during new patient visit. Explained the role of the GI Nurse Navigator and contact information. Provided information on support groups and services. Discussed antiemetic regimen and potential side effects of chemotherapy. Made her aware of triage for urgent medical needs. Answered questions, reviewed current treatment plan.   Merceda Elks, RN, BSN GI Oncology [place]

## 2015-03-10 NOTE — Telephone Encounter (Signed)
per pof to sch pt appt-cld & spoke to pt and adv of chemo edu class and 1st appt-pt understood

## 2015-03-10 NOTE — Progress Notes (Signed)
[name]  HEMATOLOGY ONCOLOGY PROGRESS NOTE  Patient Care Team: Judy Blamer, MD as PCP - General (Family Medicine) Judy Maine, MD as Consulting Physician (Hematology)  SUMMARY OF ONCOLOGIC HISTORY:   Pancreatic carcinoma metastatic to liver   02/15/2015 Imaging CT Abd IMPRESSION: 1. Widespread hepatic metastatic disease. No underlying morphologic changes of cirrhosis are demonstrated to suggest multifocal hepatocellular carcinoma. 2. No primary malignancy identified within the abdomen. The pelvis was not imaged.    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 Widespread neoplastic disease throughout the liver and lymph nodes of the upper abdomen. 2. No definite extrahepatic primary malignancy identified. 3. Potential early nodal metastases within the right internal mammary, subcarinal and left hilar station   03/07/2015 Initial Biopsy Preliminary results of liver lesion biopsy as per discussion with pathology Dr Frederica Kuster    INTERVAL HISTORY:  [name] is here for follow-up regarding her metastatic malignancy to the liver. She was in the hospital for a week for Brilinta washout followed by imaging guided liver lesion biopsy on 03/07/2015 as well as port placement while off Brilinta. Patient notes that she was glad to be home. She notes that her pain is better controlled and her cough has resolved since her last clinic visit. She is anxious to know the diagnosis and start treatment. I discussed her pathology results with Dr. [place] Rund and it clearly appears like a metastatic adenocarcinoma most likely pancreatic origin. Final results will require final results of additional immunohistochemistries to differentiate from hepatobiliary origin though that is much less likely. The pathology results were shared with the patient. Patient had infusion appointment scheduled for today to start treatment and she  was given the option of waiting for the final pathology results and reschedule her appointment which might be several days or proceeding with a fairly definite understanding that clinically and pathologically this is metastatic pancreatic cancer. The other element of urgency was the fact that she is in a situation of impending liver failure from significant metastatic involvement and delay in therapy and worsening of liver function might preclude treatment options. We discussed in great length the diagnosis, prognosis, treatment options and option for second opinion. Patient notes that it's very important for her to maintain her quality of life and this at the same time she wants to treat this fairly aggressively. It is very important for her to keep active. After discussing the options of FOLFIRINOX versus gemcitabine and Abraxane she chose to go with the gemcitabine and Abraxane. She was given patient handouts for the medications and toxicities were discussed in details. A printout was given of the treatment regimen. An informed signed chemotherapy consent was obtained. The patient's husband accompanied her during this visit and was very supportive. Patient appears to be in understanding of the situation and though understandably sad appears to be dealing with it fairly well at this time. She and her husband clearly understand the treatments are palliative and meant to control the disease and delay symptoms of disease progression possible and to not offer the chance of cure.  Patient notes that the pancreatic enzyme supplements have been useful. She has been encouraged to increase her oral intake. Her pain and nausea are well controlled currently. No follows fevers or chills. Regular bowel movements. Cough is much improved. Her Ace inhibitors were switched to ARB's and this likely helped as well.   REVIEW OF SYSTEMS:  10 point review of systems was done and is negative except as noted above.  I have  reviewed the past medical history, past surgical history, social history and family history with the patient and they are unchanged from previous note.  ALLERGIES:  is allergic to boniva; delsym; erythromycin; fosamax; glucosamine forte; guaifenesin & derivatives; lactose intolerance (gi); nexium; tegretol; and zostavax.  MEDICATIONS:  Current Outpatient Prescriptions  Medication Sig Dispense Refill  . acetaminophen (TYLENOL) 325 MG tablet Take 650 mg by mouth every 6 (six) hours as needed for mild pain.    [name] AMBULATORY NON FORMULARY MEDICATION Take 90 mg by mouth 2 (two) times daily. Medication Name: Brilinta 90 mg BID provided by [employer] study (Do Not Fill)    . AMBULATORY NON FORMULARY MEDICATION Take 81 mg by mouth daily. Medication Name: ASA 81 mg daily Provided by [employer] study    . atorvastatin (LIPITOR) 80 MG tablet Take 1 tablet (80 mg total) by mouth daily at 6 PM. 30 tablet 11  . Azelaic Acid 15 % cream Apply 1 application topically daily as needed (for acne and rosacea).     . carbamazepine (TEGRETOL XR) 400 MG 12 hr tablet Take 400 mg by mouth at bedtime. $RemoveBef'200mg'kWajPVCEeu$  AM, $Re'400mg'MGl$  PM    . dexamethasone (DECADRON) 2 MG tablet Take 1 tablet (2 mg total) by mouth 2 (two) times daily with a meal. 30 tablet 0  . feeding supplement (BOOST / RESOURCE BREEZE) LIQD Take 1 Container by mouth daily.  0  . fluticasone (FLONASE) 50 MCG/ACT nasal spray Place 1 spray into both nostrils daily as needed for allergies.     [name] lactase (LACTAID) 3000 UNITS tablet Take 1 tablet by mouth daily as needed (for lactose intolerance).    . lipase/protease/amylase (CREON) 12000 UNITS CPEP capsule Take 1 capsule (12,000 Units total) by mouth 3 (three) times daily before meals. 270 capsule 1  . losartan (COZAAR) 25 MG tablet Take 1 tablet (25 mg total) by mouth daily. 30 tablet 6  . metoprolol tartrate (LOPRESSOR) 25 MG tablet Take 0.5 tablets (12.5 mg total) by mouth 2 (two) times daily. 60 tablet 4  . Multiple  Vitamins-Minerals (MULTIVITAMIN & MINERAL PO) Take 1 tablet by mouth at bedtime.     . ondansetron (ZOFRAN) 8 MG tablet Take 1 tablet (8 mg total) by mouth every 8 (eight) hours as needed for nausea or vomiting. 30 tablet 3  . oxyCODONE (OXY IR/ROXICODONE) 5 MG immediate release tablet Take 1 tablet (5 mg total) by mouth every 4 (four) hours as needed for severe pain. (Patient taking differently: Take 5 mg by mouth every 4 (four) hours as needed for severe pain (for cough). ) 60 tablet 0  . TEGRETOL-XR 200 MG 12 hr tablet Take 200 mg by mouth daily.     . zaleplon (SONATA) 5 MG capsule Take 5 mg by mouth at bedtime as needed for sleep.    [name] lidocaine-prilocaine (EMLA) cream Apply topically once. 30 each 2   No current facility-administered medications for this visit.    PHYSICAL EXAMINATION: ECOG PERFORMANCE STATUS: 1 - Symptomatic but completely ambulatory  Filed Vitals:   03/10/15 0958  BP: 130/78  Pulse: 90  Temp: 98.8 F (37.1 C)  Resp: 20   Filed Weights   03/10/15 0958  Weight: 116 lb 14.4 oz (53.025 kg)    GENERAL:alert, no distress and comfortable SKIN: skin color, texture, turgor are normal, no rashes or significant lesions EYES: normal, Conjunctiva are pink  and non-injected, sclera clear OROPHARYNX:no exudate, no erythema and lips, buccal mucosa, and tongue normal  NECK: supple, thyroid normal size, non-tender, without nodularity LYMPH:  no palpable lymphadenopathy in the cervical, axillary or inguinal LUNGS: clear to auscultation and percussion with normal breathing effort HEART: regular rate & rhythm and no murmurs and no lower extremity edema ABDOMEN:abdomen soft, non-tender and normal bowel sounds Musculoskeletal:no cyanosis of digits and no clubbing  NEURO: alert & oriented x 3 with fluent speech, no focal motor/sensory deficits  LABORATORY DATA:  I have reviewed the data as listed    Component Value Date/Time   NA 134* 03/10/2015 1251   NA 135 03/05/2015  0330   K 3.9 03/10/2015 1251   K 4.9 03/05/2015 0330   CL 102 03/05/2015 0330   CO2 25 03/10/2015 1251   CO2 27 03/05/2015 0330   GLUCOSE 134 03/10/2015 1251   GLUCOSE 120* 03/05/2015 0330   BUN 12.1 03/10/2015 1251   BUN 7 03/05/2015 0330   CREATININE 0.6 03/10/2015 1251   CREATININE 0.60 03/05/2015 0330   CALCIUM 9.4 03/10/2015 1251   CALCIUM 8.8* 03/05/2015 0330   PROT 7.0 03/10/2015 1251   PROT 6.8 03/02/2015 1513   ALBUMIN 2.9* 03/10/2015 1251   ALBUMIN 3.4* 03/02/2015 1513   AST 65* 03/10/2015 1251   AST 125* 03/02/2015 1513   ALT 54 03/10/2015 1251   ALT 80* 03/02/2015 1513   ALKPHOS 297* 03/10/2015 1251   ALKPHOS 226* 03/02/2015 1513   BILITOT 0.52 03/10/2015 1251   BILITOT 0.5 03/02/2015 1513   GFRNONAA >60 03/05/2015 0330   GFRAA >60 03/05/2015 0330    No results found for: SPEP, UPEP  Lab Results  Component Value Date   WBC 10.9* 03/10/2015   NEUTROABS 8.0* 03/10/2015   HGB 10.8* 03/10/2015   HCT 31.6* 03/10/2015   MCV 93.8 03/10/2015   PLT 252 03/10/2015      Chemistry      Component Value Date/Time   NA 134* 03/10/2015 1251   NA 135 03/05/2015 0330   K 3.9 03/10/2015 1251   K 4.9 03/05/2015 0330   CL 102 03/05/2015 0330   CO2 25 03/10/2015 1251   CO2 27 03/05/2015 0330   BUN 12.1 03/10/2015 1251   BUN 7 03/05/2015 0330   CREATININE 0.6 03/10/2015 1251   CREATININE 0.60 03/05/2015 0330      Component Value Date/Time   CALCIUM 9.4 03/10/2015 1251   CALCIUM 8.8* 03/05/2015 0330   ALKPHOS 297* 03/10/2015 1251   ALKPHOS 226* 03/02/2015 1513   AST 65* 03/10/2015 1251   AST 125* 03/02/2015 1513   ALT 54 03/10/2015 1251   ALT 80* 03/02/2015 1513   BILITOT 0.52 03/10/2015 1251   BILITOT 0.5 03/02/2015 1513       RADIOGRAPHIC STUDIES: I have personally reviewed the radiological images as listed and agreed with the findings in the report. No results found.   ASSESSMENT & PLAN:    Pancreatic carcinoma metastatic to the liver Preliminary  result of her biopsy is consistent with pancreatic origin of her metastatic carcinoma. CA-19-9 12,000  Port placement completed and functioning well. No issues with bleeding from the biopsy or port placement. Plan -After extensive counseling on the diagnosis, prognosis, treatment options for her condition patient signed an informed consent to proceed with gemcitabine/Abraxane chemotherapy for the palliative treatment of her metastatic malignancy. -All her questions and that of her husband were answered in detail. -She will continue oxycodone when necessary for pain control. -  Continue lactate is and adequate enzyme supplements dyspeptic symptoms -Continue low-dose dexamethasone for appetite stimulation, suppression of nausea and pain from liver metastases. -Continue chemotherapy as per scheduled plan on day 8 and day 15 with close monitoring of labs.  -I will see her back in clinic in one week for toxicity check and labs  -Patient has a clinic number to call if any acute concerns arise. -She was encouraged to follow up in the wellness Center for additional ancillary services, wig program. -She was also recommended follow-up with the dietitian optimize her oral intake -Given her family history of breast cancer, colon cancer and her personal history of pancreatic cancer after her permission was obtained and she is agreeable to getting referred for genetic testing. Referral has been placed to genetic counselor to evaluate for appropriateness of BRCA2 testing.  Protein calorie malnutrition -Dietitian referal given -pending -Continue lactase and pancreatic enzyme supplements to help avoid significant dyspeptic symptoms.  Pain management -Continue oxycodone when necessary. Patient not needing scheduled long-acting medications at this time but will closely monitor to optimize symptom control.  Dyspepsia well controlled currently.  -Continue Zofran when necessary, dexamethasone  -Continue lactase and  pancreatic enzyme supplements.    Orders Placed This Encounter  Procedures  . CBC & Diff and Retic    Standing Status: Future     Number of Occurrences: 1     Standing Expiration Date: 04/13/2016  . Comprehensive metabolic panel    Standing Status: Future     Number of Occurrences: 1     Standing Expiration Date: 04/13/2016  . CA 19.9    Standing Status: Future     Number of Occurrences:      Standing Expiration Date: 03/09/2016  . Amb Referral to Nutrition and Diabetic E    Referral Priority:  Urgent    Referral Type:  Consultation    Referral Reason:  Specialty Services Required    Number of Visits Requested:  1   All questions were answered. The patient knows to call the clinic with any problems, questions or concerns. No barriers to learning was detected. I spent 45 minutes counseling the patient face to face. The total time spent in the appointment was 55 minutes and more than 50% was on counseling and review of test results   Sullivan Lone MD [facility]  (Office):       [phone] (Work cell):  [phone] (Fax):           [phone]

## 2015-03-14 ENCOUNTER — Other Ambulatory Visit: Payer: Self-pay | Admitting: Hematology

## 2015-03-14 DIAGNOSIS — C787 Secondary malignant neoplasm of liver and intrahepatic bile duct: Principal | ICD-10-CM

## 2015-03-14 DIAGNOSIS — C259 Malignant neoplasm of pancreas, unspecified: Secondary | ICD-10-CM

## 2015-03-14 MED ORDER — LANSOPRAZOLE 30 MG PO CPDR: 30.0000 mg | DELAYED_RELEASE_CAPSULE | Freq: Every day | ORAL | Status: DC

## 2015-03-14 MED ORDER — SENNOSIDES-DOCUSATE SODIUM 8.6-50 MG PO TABS: 2.0000 | ORAL_TABLET | Freq: Every day | ORAL | Status: AC

## 2015-03-15 ENCOUNTER — Other Ambulatory Visit: Payer: Medicare PPO

## 2015-03-15 ENCOUNTER — Telehealth: Payer: Self-pay | Admitting: Cardiology

## 2015-03-15 ENCOUNTER — Other Ambulatory Visit (HOSPITAL_BASED_OUTPATIENT_CLINIC_OR_DEPARTMENT_OTHER): Payer: Medicare PPO

## 2015-03-15 ENCOUNTER — Encounter: Payer: Self-pay | Admitting: *Deleted

## 2015-03-15 ENCOUNTER — Telehealth: Payer: Self-pay

## 2015-03-15 ENCOUNTER — Encounter: Payer: Self-pay | Admitting: Nurse Practitioner

## 2015-03-15 ENCOUNTER — Ambulatory Visit (HOSPITAL_BASED_OUTPATIENT_CLINIC_OR_DEPARTMENT_OTHER): Payer: Medicare PPO | Admitting: Nurse Practitioner

## 2015-03-15 VITALS — BP 102/53 | HR 82 | Temp 98.8°F | Resp 18

## 2015-03-15 DIAGNOSIS — C787 Secondary malignant neoplasm of liver and intrahepatic bile duct: Principal | ICD-10-CM

## 2015-03-15 DIAGNOSIS — C259 Malignant neoplasm of pancreas, unspecified: Secondary | ICD-10-CM

## 2015-03-15 DIAGNOSIS — R05 Cough: Secondary | ICD-10-CM | POA: Diagnosis not present

## 2015-03-15 DIAGNOSIS — E860 Dehydration: Secondary | ICD-10-CM

## 2015-03-15 DIAGNOSIS — R740 Nonspecific elevation of levels of transaminase and lactic acid dehydrogenase [LDH]: Secondary | ICD-10-CM

## 2015-03-15 DIAGNOSIS — R7401 Elevation of levels of liver transaminase levels: Secondary | ICD-10-CM

## 2015-03-15 DIAGNOSIS — R112 Nausea with vomiting, unspecified: Secondary | ICD-10-CM | POA: Diagnosis not present

## 2015-03-15 DIAGNOSIS — R059 Cough, unspecified: Secondary | ICD-10-CM

## 2015-03-15 DIAGNOSIS — E8809 Other disorders of plasma-protein metabolism, not elsewhere classified: Secondary | ICD-10-CM | POA: Diagnosis not present

## 2015-03-15 DIAGNOSIS — H1131 Conjunctival hemorrhage, right eye: Secondary | ICD-10-CM

## 2015-03-15 LAB — CBC WITH DIFFERENTIAL/PLATELET
BASO%: 1.1 % (ref 0.0–2.0)
BASOS ABS: 0.1 10*3/uL (ref 0.0–0.1)
EOS%: 2.7 % (ref 0.0–7.0)
Eosinophils Absolute: 0.2 10*3/uL (ref 0.0–0.5)
HCT: 29.6 % — ABNORMAL LOW (ref 34.8–46.6)
HGB: 9.8 g/dL — ABNORMAL LOW (ref 11.6–15.9)
LYMPH#: 0.5 10*3/uL — AB (ref 0.9–3.3)
LYMPH%: 7.4 % — ABNORMAL LOW (ref 14.0–49.7)
MCH: 31.5 pg (ref 25.1–34.0)
MCHC: 33.1 g/dL (ref 31.5–36.0)
MCV: 95.3 fL (ref 79.5–101.0)
MONO#: 0.2 10*3/uL (ref 0.1–0.9)
MONO%: 2.4 % (ref 0.0–14.0)
NEUT%: 86.4 % — ABNORMAL HIGH (ref 38.4–76.8)
NEUTROS ABS: 5.4 10*3/uL (ref 1.5–6.5)
Platelets: 238 10*3/uL (ref 145–400)
RBC: 3.1 10*6/uL — AB (ref 3.70–5.45)
RDW: 14.4 % (ref 11.2–14.5)
WBC: 6.3 10*3/uL (ref 3.9–10.3)

## 2015-03-15 LAB — COMPREHENSIVE METABOLIC PANEL (CC13)
ALBUMIN: 2.9 g/dL — AB (ref 3.5–5.0)
ALT: 235 U/L — ABNORMAL HIGH (ref 0–55)
AST: 348 U/L (ref 5–34)
Alkaline Phosphatase: 290 U/L — ABNORMAL HIGH (ref 40–150)
Anion Gap: 8 mEq/L (ref 3–11)
BUN: 14.6 mg/dL (ref 7.0–26.0)
CALCIUM: 9.1 mg/dL (ref 8.4–10.4)
CO2: 24 meq/L (ref 22–29)
Chloride: 100 mEq/L (ref 98–109)
Creatinine: 0.8 mg/dL (ref 0.6–1.1)
EGFR: 82 mL/min/{1.73_m2} — AB (ref >90)
GLUCOSE: 136 mg/dL (ref 70–140)
Potassium: 4.3 mEq/L (ref 3.5–5.1)
Sodium: 131 mEq/L — ABNORMAL LOW (ref 136–145)
Total Bilirubin: 0.78 mg/dL (ref 0.20–1.20)
Total Protein: 7 g/dL (ref 6.4–8.3)

## 2015-03-15 MED ORDER — SODIUM CHLORIDE 0.9 % IV SOLN
1000.0000 mL | Freq: Once | INTRAVENOUS | Status: AC
  Administered 2015-03-15: 1000 mL via INTRAVENOUS

## 2015-03-15 MED ORDER — ALBUTEROL SULFATE (2.5 MG/3ML) 0.083% IN NEBU
INHALATION_SOLUTION | RESPIRATORY_TRACT | Status: AC
  Filled 2015-03-15: qty 3

## 2015-03-15 MED ORDER — HEPARIN SOD (PORK) LOCK FLUSH 100 UNIT/ML IV SOLN
500.0000 [IU] | Freq: Once | INTRAVENOUS | Status: AC
  Administered 2015-03-15: 500 [IU] via INTRAVENOUS
  Filled 2015-03-15: qty 5

## 2015-03-15 MED ORDER — SODIUM CHLORIDE 0.9 % IJ SOLN
10.0000 mL | INTRAMUSCULAR | Status: DC | PRN
  Administered 2015-03-15: 10 mL via INTRAVENOUS
  Filled 2015-03-15: qty 10

## 2015-03-15 MED ORDER — IPRATROPIUM-ALBUTEROL 0.5-2.5 (3) MG/3ML IN SOLN: 3.0000 mL | Freq: Once | RESPIRATORY_TRACT | Status: DC

## 2015-03-15 MED ORDER — ALBUTEROL SULFATE (2.5 MG/3ML) 0.083% IN NEBU
2.5000 mg | INHALATION_SOLUTION | Freq: Once | RESPIRATORY_TRACT | Status: AC
  Administered 2015-03-15: 2.5 mg via RESPIRATORY_TRACT
  Filled 2015-03-15: qty 3

## 2015-03-15 MED ORDER — SODIUM CHLORIDE 0.9 % IV SOLN
Freq: Once | INTRAVENOUS | Status: AC
  Administered 2015-03-15: 13:00:00 via INTRAVENOUS
  Filled 2015-03-15: qty 4

## 2015-03-15 MED ORDER — IPRATROPIUM BROMIDE 0.02 % IN SOLN
0.5000 mg | Freq: Once | RESPIRATORY_TRACT | Status: AC
  Administered 2015-03-15: 0.5 mg via RESPIRATORY_TRACT
  Filled 2015-03-15: qty 2.5

## 2015-03-15 NOTE — Assessment & Plan Note (Signed)
Liver enzymes elevated today with AST up from 65-348.  ALT has increased from 54 up to  235.  Patient admits that she has been taking an excessive amount of Tylenol recently.  Advised patient to take only minimal Tylenol in the future.  Will continue to monitor closely.

## 2015-03-15 NOTE — Assessment & Plan Note (Signed)
Patient received her first cycle of Abraxane/gemcitabine chemotherapy on 03/10/2015.  She is scheduled to return on 03/17/2015 for labs, follow up visit, and her next cycle of chemotherapy.

## 2015-03-15 NOTE — Telephone Encounter (Signed)
Left message to call back  

## 2015-03-15 NOTE — Assessment & Plan Note (Signed)
Patient received her first cycle of chemotherapy on 03/10/2015.  She is complaining of some chronic nausea; and states that she has vomited 1.  Large amount each day.  She admits to very poor oral intake as well.  Patient was given IV fluid rehydration while at the [facility] today.  She was also given Zofran IV.  She was encouraged to push fluids at home as well.

## 2015-03-15 NOTE — Assessment & Plan Note (Signed)
Patient continues to complain of a chronic cough for the past several weeks.  Patient states that she has no nasal congestion, sore throat, or headaches.  He does complain of left ear fullness; but no pain or hearing loss.  She denies any vision changes.  She denies any facial tenderness with palpation.  She reports that she did develop a fever over the weekend; but has had no fever since Sunday, 03/13/2015.  Patient has been given prophylactic anabiotic's recently for treatment of cough.  She has also undergone 2 different chest x-rays with no acute findings.  Her cardiologist has also evaluated the patient's cough; and tried switching her blood pressure medications.-With only minimal improvement.  With further questioning-patient does admit that her cough is slowly improving, however.  On exam.-Patient with clear breath sounds bilaterally.  No wheezing noted on exam.  Patient does have a somewhat congested.  Cough occasionally on exam.  There is no Q respiratory distress.  Patient was given a DuoNeb nebulizer treatment while at the cancer center.  Patient feels that it did not ease her breathing or coughing very much.  Discussed at weight possible causes of patient's cough; including possibility of seasonal allergies, medication side effect, or disease process.  Patient was advised that she may try over-the-counter cough syrup and Mucinex to see if that helps.  She may also want to consider an over-the-counter allergy medication as well.  Patient was advised to call/return or go directly to the emergency department for any worsening symptoms whatsoever.

## 2015-03-15 NOTE — Telephone Encounter (Signed)
Pt called b/c she is coughing a lot. She threw up this morning about 5 minutes after her Brilinta and ASA. She threw up "everything" including her breakfast and is not sure if pills were thrown up. She also put in a call to her cardiologist. She will be starting her new medication for reflux at about noon today. She has been coughing for 3 weeks. She is asking if there is anything we can do.  She is weak as water, she has not been drinking a lot water. She was drinking water good until last night. She is nauseated. Thrown up several times. Coughing triggers the throwing up. She brings up phlegm but she cannot get it out. No fever. Was 101.5 over the weekend but did not go to ER. No fever today. Diarrhea last night small amount.  Pt is coming in to chemo class. S/w Dr Irene Limbo and pt will see [facility]. Cancel chemo education for the present.

## 2015-03-15 NOTE — Assessment & Plan Note (Signed)
Albumen remains low at 2.9.  Patient was encouraged to push protein in her diet is much as possible.

## 2015-03-15 NOTE — Progress Notes (Signed)
SYMPTOM MANAGEMENT CLINIC   HPI: Judy Cummings 69 y.o. female diagnosed with pancreatic cancer; with liver metastasis.  Currently undergoing Abraxane/gemcitabine chemotherapy regimen.  Patient complaining of chronic nausea and vomiting on a daily basis since initiating her chemotherapy last week.  She states she has been taking her antinausea medications at home; with only some moderate effectiveness.  She feels dehydrated today.  Patient continues to complain of a chronic cough for the past several weeks.  Patient states that she has no nasal congestion, sore throat, or headaches.  He does complain of left ear fullness; but no pain or hearing loss.  She denies any vision changes.  She denies any facial tenderness with palpation.  She reports that she did develop a fever over the weekend; but has had no fever since Sunday, 03/13/2015.  Patient has been given prophylactic anabiotic's recently for treatment of cough.  She has also undergone 2 different chest x-rays with no acute findings.  Her cardiologist has also evaluated the patient's cough; and tried switching her blood pressure medications.-With only minimal improvement.  With further questioning-patient does admit that her cough is slowly improving, however.  HPI  ROS  Past Medical History  Diagnosis Date  . Osteopenia   . Hypercholesterolemia   . Menopause   . Rosacea   . Lactose intolerance   . TMJ (dislocation of temporomandibular joint)   . Fatigue   . Rosacea   . TMJ (dislocation of temporomandibular joint)   . Mild aortic stenosis     echo 01/2015  . Coronary artery disease   . Family history of adverse reaction to anesthesia     "my mother gets PONV"  . GERD (gastroesophageal reflux disease)   . Epileptic seizure     "controlled; started w/my periods; last one was years ago" (02/07/2015)  . Sleep disorder   . 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 CA 19-9 levels On 02/21/2015 were noted to be 11,800 making pancreatic adenocarcinoma the likely primary site.     Past Surgical History  Procedure Laterality Date  . Tonsillectomy    . Coronary angioplasty    . Ankle fracture surgery Left ~ 1977    "put 4 pins in"  . Hemangioma excision  ~ 2006    "inside my mouth"  . Hemangioma w/ laser excision  "yearly since ~ 2006"    "inside my mouth"  . Cardiac catheterization N/A 02/07/2015    Procedure: Left Heart Cath and Coronary Angiography;  Surgeon: Kathleene Hazel, MD;  Location: [facility] INVASIVE CV LAB;  Service: Cardiovascular;  Laterality: N/A;  . Cardiac catheterization N/A 02/07/2015    Procedure: Coronary Stent Intervention;  Surgeon: Kathleene Hazel, MD;  Location: [facility] INVASIVE CV LAB;  Service: Cardiovascular;  Laterality: N/A;    has Seizures; Osteopenia; Dyslipidemia; Menopause; Rosacea; Lactose intolerance; Fatigue due to excessive exertion; DOE (dyspnea on exertion); Heart palpitations; Heart murmur; Left carotid bruit; Unstable angina; Essential hypertension; CAD S/P RCA, LAD DES-02/07/15; Carotid disease, bilateral; Pancreatic carcinoma metastatic to liver; Epilepsy without status epilepticus, not intractable; Other fatigue; Neurosis, occupational; Sleep disorder; Protein calorie malnutrition; Pain management; Dyspepsia; Abnormal tumor markers; Cough-ACE changed tyo ARB; Liver cancer; Liver lesion; Nausea with vomiting; Dehydration; Scleral hemorrhage of right eye; Hyperphosphatemia; Transaminitis; and Hypoalbuminemia on her problem list.    is allergic to boniva; delsym;  erythromycin; fosamax; glucosamine forte; guaifenesin & derivatives; lactose intolerance (gi); nexium; tegretol; and zostavax.    Medication List       This list is accurate as of: 03/15/15  5:36 PM.  Always use your most recent med list.                 acetaminophen 325 MG tablet  Commonly known as:  TYLENOL  Take 650 mg by mouth every 6 (six) hours as needed for mild pain.     AMBULATORY NON FORMULARY MEDICATION  Take 90 mg by mouth 2 (two) times daily. Medication Name: Brilinta 90 mg BID provided by [employer] Research study (Do Not Fill)     AMBULATORY NON FORMULARY MEDICATION  Take 81 mg by mouth daily. Medication Name: ASA 81 mg daily Provided by [employer] study     atorvastatin 80 MG tablet  Commonly known as:  LIPITOR  Take 1 tablet (80 mg total) by mouth daily at 6 PM.     Azelaic Acid 15 % cream  Apply 1 application topically daily as needed (for acne and rosacea).     dexamethasone 2 MG tablet  Commonly known as:  DECADRON  Take 1 tablet (2 mg total) by mouth 2 (two) times daily with a meal.     feeding supplement Liqd  Take 1 Container by mouth daily.     fluticasone 50 MCG/ACT nasal spray  Commonly known as:  FLONASE  Place 1 spray into both nostrils daily as needed for allergies.     lactase 3000 UNITS tablet  Commonly known as:  LACTAID  Take 1 tablet by mouth daily as needed (for lactose intolerance).     lansoprazole 30 MG capsule  Commonly known as:  PREVACID  Take 1 capsule (30 mg total) by mouth daily at 12 noon.     lidocaine-prilocaine cream  Commonly known as:  EMLA  Apply topically once.     lipase/protease/amylase 12000 UNITS Cpep capsule  Commonly known as:  CREON  Take 1 capsule (12,000 Units total) by mouth 3 (three) times daily before meals.     losartan 25 MG tablet  Commonly known as:  COZAAR  Take 1 tablet (25 mg total) by mouth daily.     metoprolol tartrate 25 MG tablet  Commonly known as:  LOPRESSOR  Take 0.5 tablets (12.5 mg total) by mouth 2 (two) times daily.     MULTIVITAMIN & MINERAL PO  Take 1 tablet by mouth at bedtime.     ondansetron 8 MG tablet  Commonly known as:  ZOFRAN  Take 1 tablet (8 mg total) by mouth every 8 (eight) hours as needed for nausea or vomiting.      oxyCODONE 5 MG immediate release tablet  Commonly known as:  Oxy IR/ROXICODONE  Take 1 tablet (5 mg total) by mouth every 4 (four) hours as needed for severe pain.     senna-docusate 8.6-50 MG per tablet  Commonly known as:  SENNA S  Take 2 tablets by mouth at bedtime. May increase to 2 tab twice daily if constipation persistent     carbamazepine 400 MG 12 hr tablet  Commonly known as:  TEGRETOL XR  Take 400 mg by mouth at bedtime. $RemoveBef'200mg'SpYShabMIz$  AM, $Re'400mg'zkX$  PM     TEGRETOL-XR 200 MG 12 hr tablet  Generic drug:  carbamazepine  Take 200 mg by mouth daily.     zaleplon 5 MG capsule  Commonly known as:  SONATA  Take 5 mg by  mouth at bedtime as needed for sleep.         PHYSICAL EXAMINATION  Oncology Vitals 03/15/2015 03/10/2015 03/09/2015 03/09/2015 03/09/2015 03/08/2015 03/08/2015  Height - 160 cm - - - - -  Weight - 53.025 kg - - - - -  Weight (lbs) - 116 lbs 14 oz - - - - -  BMI (kg/m2) - 20.71 kg/m2 - - - - -  Temp 98.8 98.8 98.4 99.7 98.6 98.4 98.4  Pulse 82 90 86 - 79 85 83  Resp $Rem'18 20 18 18 18 18 20  'htSf$ SpO2 - 98 - 98 100 98 97  BSA (m2) - 1.53 m2 - - - - -   BP Readings from Last 3 Encounters:  03/15/15 102/53  03/10/15 130/78  03/09/15 134/64    Physical Exam  Constitutional: She is oriented to person, place, and time and well-developed, well-nourished, and in no distress.  HENT:  Head: Normocephalic and atraumatic.  Mouth/Throat: Oropharynx is clear and moist.  Patient has bruising to her left lower lip and discoloration to tongue and floor of mouth as baseline.  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  Years ago; and there has been no recent changes.  Eyes: EOM are normal. Pupils are equal, round, and reactive to light. Right eye exhibits no discharge. Left eye exhibits no discharge. No scleral icterus.  Right outer corner scleral hemorrhage noted.  Neck: Normal range of motion. Neck supple. No JVD present. No tracheal  deviation present. No thyromegaly present.  Cardiovascular: Normal rate, regular rhythm, normal heart sounds and intact distal pulses.   Pulmonary/Chest: Effort normal and breath sounds normal. No stridor. No respiratory distress. She has no wheezes. She has no rales. She exhibits no tenderness.  Occasional congested cough on exam; but no acute respiratory distress.  Abdominal: Soft. Bowel sounds are normal. She exhibits no distension and no mass. There is no tenderness. There is no rebound and no guarding.  Musculoskeletal: Normal range of motion. She exhibits no edema or tenderness.  Lymphadenopathy:    She has no cervical adenopathy.  Neurological: She is alert and oriented to person, place, and time. Gait normal.  Skin: Skin is warm and dry. No rash noted. No erythema. No pallor.  Psychiatric: Affect normal.  Nursing note and vitals reviewed.   LABORATORY DATA:. Appointment on 03/15/2015  Component Date Value Ref Range Status  . WBC 03/15/2015 6.3  3.9 - 10.3 10e3/uL Final  . NEUT# 03/15/2015 5.4  1.5 - 6.5 10e3/uL Final  . HGB 03/15/2015 9.8* 11.6 - 15.9 g/dL Final  . HCT 03/15/2015 29.6* 34.8 - 46.6 % Final  . Platelets 03/15/2015 238  145 - 400 10e3/uL Final  . MCV 03/15/2015 95.3  79.5 - 101.0 fL Final  . MCH 03/15/2015 31.5  25.1 - 34.0 pg Final  . MCHC 03/15/2015 33.1  31.5 - 36.0 g/dL Final  . RBC 03/15/2015 3.10* 3.70 - 5.45 10e6/uL Final  . RDW 03/15/2015 14.4  11.2 - 14.5 % Final  . lymph# 03/15/2015 0.5* 0.9 - 3.3 10e3/uL Final  . MONO# 03/15/2015 0.2  0.1 - 0.9 10e3/uL Final  . Eosinophils Absolute 03/15/2015 0.2  0.0 - 0.5 10e3/uL Final  . Basophils Absolute 03/15/2015 0.1  0.0 - 0.1 10e3/uL Final  . NEUT% 03/15/2015 86.4* 38.4 - 76.8 % Final  . LYMPH% 03/15/2015 7.4* 14.0 - 49.7 % Final  . MONO% 03/15/2015 2.4  0.0 - 14.0 %  Final  . EOS% 03/15/2015 2.7  0.0 - 7.0 % Final  . BASO% 03/15/2015 1.1  0.0 - 2.0 % Final  . Sodium 03/15/2015 131* 136 - 145 mEq/L Final    . Potassium 03/15/2015 4.3  3.5 - 5.1 mEq/L Final  . Chloride 03/15/2015 100  98 - 109 mEq/L Final  . CO2 03/15/2015 24  22 - 29 mEq/L Final  . Glucose 03/15/2015 136  70 - 140 mg/dl Final  . BUN 03/15/2015 14.6  7.0 - 26.0 mg/dL Final  . Creatinine 03/15/2015 0.8  0.6 - 1.1 mg/dL Final  . Total Bilirubin 03/15/2015 0.78  0.20 - 1.20 mg/dL Final  . Alkaline Phosphatase 03/15/2015 290* 40 - 150 U/L Final  . AST 03/15/2015 348* 5 - 34 U/L Final  . ALT 03/15/2015 235* 0 - 55 U/L Final  . Total Protein 03/15/2015 7.0  6.4 - 8.3 g/dL Final  . Albumin 03/15/2015 2.9* 3.5 - 5.0 g/dL Final  . Calcium 03/15/2015 9.1  8.4 - 10.4 mg/dL Final  . Anion Gap 03/15/2015 8  3 - 11 mEq/L Final  . EGFR 03/15/2015 82* >90 ml/min/1.73 m2 Final   eGFR is calculated using the CKD-EPI Creatinine Equation (2009)     RADIOGRAPHIC STUDIES: No results found.  ASSESSMENT/PLAN:    Cough-ACE changed tyo ARB Patient continues to complain of a chronic cough for the past several weeks.  Patient states that she has no nasal congestion, sore throat, or headaches.  He does complain of left ear fullness; but no pain or hearing loss.  She denies any vision changes.  She denies any facial tenderness with palpation.  She reports that she did develop a fever over the weekend; but has had no fever since Sunday, 03/13/2015.  Patient has been given prophylactic anabiotic's recently for treatment of cough.  She has also undergone 2 different chest x-rays with no acute findings.  Her cardiologist has also evaluated the patient's cough; and tried switching her blood pressure medications.-With only minimal improvement.  With further questioning-patient does admit that her cough is slowly improving, however.  On exam.-Patient with clear breath sounds bilaterally.  No wheezing noted on exam.  Patient does have a somewhat congested.  Cough occasionally on exam.  There is no Q respiratory distress.  Patient was given a DuoNeb  nebulizer treatment while at the cancer center.  Patient feels that it did not ease her breathing or coughing very much.  Discussed at weight possible causes of patient's cough; including possibility of seasonal allergies, medication side effect, or disease process.  Patient was advised that she may try over-the-counter cough syrup and Mucinex to see if that helps.  She may also want to consider an over-the-counter allergy medication as well.  Patient was advised to call/return or go directly to the emergency department for any worsening symptoms whatsoever.  Dehydration Patient received her first cycle of chemotherapy on 03/10/2015.  She is complaining of some chronic nausea; and states that she has vomited 1.  Large amount each day.  She admits to very poor oral intake as well.  Patient was given IV fluid rehydration while at the [facility] today.  She was also given Zofran IV.  She was encouraged to push fluids at home as well.  Hyperphosphatemia Alkaline phosphatase has remained stable at 290.  Will continue to monitor closely.  Hypoalbuminemia Albumen remains low at 2.9.  Patient was encouraged to push protein in her diet is much as possible.  Nausea with vomiting  Patient received her first cycle of chemotherapy on 03/10/2015.  She is complaining of some chronic nausea; and states that she has vomited 1.  Large amount each day.  She admits to very poor oral intake as well.  Patient states that she does have anti-nausea medications at home to take on an as-needed basis.  Patient was given IV fluid rehydration while at the [facility] today.  She was also given Zofran IV.  She was encouraged to push fluids at home as well.   Pancreatic carcinoma metastatic to liver Patient received her first cycle of Abraxane/gemcitabine chemotherapy on 03/10/2015.  She is scheduled to return on 03/17/2015 for labs, follow up visit, and her next cycle of chemotherapy.  Scleral hemorrhage of  right eye Patient has developed a spontaneous right eye.  Scleral hemorrhage.  Patient denies any vision changes.  Platelet count remained stable at 238.  It is noted the patient is currently taking Brilinta and aspirin; which may be associated with the development of a scleral hemorrhage.  Transaminitis Liver enzymes elevated today with AST up from 65-348.  ALT has increased from 54 up to  235.  Patient admits that she has been taking an excessive amount of Tylenol recently.  Advised patient to take only minimal Tylenol in the future.  Will continue to monitor closely.  Patient stated understanding of all instructions; and was in agreement with this plan of care. The patient knows to call the clinic with any problems, questions or concerns.   This was a shared visit with Dr.Kale today.  Total time spent with patient was 40 minutes;  with greater than 75 percent of that time spent in face to face counseling regarding patient's symptoms,  and coordination of care and follow up.  Disclaimer: This note was dictated with voice recognition software. Similar sounding words can inadvertently be transcribed and may not be corrected upon review.   Drue Second, NP 03/15/2015    Addendum  I saw Ms. Aaniyah Strohm in symptom management clinic today. She notes persistent nausea and heartburns. Was having some low-grade fevers over the weekend which have now resolved with no fever on Monday or today. No change in abdominal pain which is better controlled now. Was taking Tylenol every 4 hours over the weekend as recommended by the triage nurse. Had significant nausea and threw up about once a day. Labs today showed some mild dehydration. Notes persistent cough, nonproductive no change in breathing. This was thought to be related to significant acid reflux versus diaphragmatic irritation versus post bronchitis reactive airway. She was treated with a nebulization, her dexamethasone was discontinued except around  the time of chemotherapy and she was started on a PPI. She has recently completed levofloxacin for 5 days.  Her labs showed elevated AST and ALT which could be from excessive Tylenol use or from liver inflammation related to tumor breakdown or chemotherapy. Bilirubin within normal limits. No leukocytosis increased abdominal pain or persistent fever to suggest ascending cholangitis at this time though the patient was counseled to keep a close eye on the development of further fever. Patient was counseled to completely avoid tylenol And other nephrotoxic medications.  Was given Zofran for nausea and IV fluids for dehydration.  She'll follow up with me on 03/17/2015 for repeat labs and evaluation she is doing and more and liver functions. We will repeat CBC, CMP as well as coags.  Some conjunctival hemorrhage. With no change in vision. Likely related to coughing along with being on  dual antiplatelet therapy. No other intervention required at this time.  Patient was personally  examined evaluated, labs were reviewed and case was discussed in detail with Retta Mac.  Sullivan Lone MD [facility] Phys[facility]  (Office):       [phone] (Work cell):  [phone] (Fax):           [phone]

## 2015-03-15 NOTE — Assessment & Plan Note (Signed)
Patient has developed a spontaneous right eye.  Scleral hemorrhage.  Patient denies any vision changes.  Platelet count remained stable at 238.  It is noted the patient is currently taking Brilinta and aspirin; which may be associated with the development of a scleral hemorrhage.

## 2015-03-15 NOTE — Assessment & Plan Note (Signed)
Alkaline phosphatase has remained stable at 290.  Will continue to monitor closely.

## 2015-03-15 NOTE — Telephone Encounter (Signed)
New Message   Pt is calling because she has been coughing non stop   And she took her Brilinta and an Asprin and she believes she threw it up...   She wants to know if she can take another one, and the cough that she has is causing her to vomit.  She has had the cough for 3 weeks

## 2015-03-15 NOTE — Telephone Encounter (Signed)
Patient st she has been sick with an awful cough since visiting her mom (who was sick) in the nursing home 3 weeks ago. She also has been nauseated from her chemo. She st she threw up this AM about 1 minute after taking her Brilinta and ASA.  She was concerned because she did not know whether to take more. She did not see the medications in her vomit.  Instructed her not to take another dose of either med since she is not certain she threw them up.  Patient agrees to resume medications as directed for night time doses.

## 2015-03-15 NOTE — Assessment & Plan Note (Signed)
Patient received her first cycle of chemotherapy on 03/10/2015.  She is complaining of some chronic nausea; and states that she has vomited 1.  Large amount each day.  She admits to very poor oral intake as well.  Patient states that she does have anti-nausea medications at home to take on an as-needed basis.  Patient was given IV fluid rehydration while at the [facility] today.  She was also given Zofran IV.  She was encouraged to push fluids at home as well.

## 2015-03-16 ENCOUNTER — Encounter: Payer: Self-pay | Admitting: Cardiology

## 2015-03-16 ENCOUNTER — Telehealth: Payer: Self-pay

## 2015-03-16 ENCOUNTER — Telehealth: Payer: Self-pay | Admitting: *Deleted

## 2015-03-16 NOTE — Telephone Encounter (Signed)
Had diarrhea all night, she did have a milkshake and she is lactose intolerant. Is sipping on water and had a good productive urine at about noon. Her bowels are not diarrhea today. Able to eat an egg and 1/2 piece of toast.  Confirmed that Dr Irene Limbo wants her to keep lab and MD appt tomorrow.

## 2015-03-16 NOTE — Telephone Encounter (Signed)
Pt called to give update to Dr Grier Mitts nurse. States she received IVF yesterday. Had episode of diarrhea during night. Feeling better today, taking fluids well. Temp was 99 today

## 2015-03-17 ENCOUNTER — Other Ambulatory Visit (HOSPITAL_BASED_OUTPATIENT_CLINIC_OR_DEPARTMENT_OTHER): Payer: Medicare PPO

## 2015-03-17 ENCOUNTER — Ambulatory Visit (HOSPITAL_BASED_OUTPATIENT_CLINIC_OR_DEPARTMENT_OTHER): Payer: Medicare PPO | Admitting: Hematology

## 2015-03-17 ENCOUNTER — Ambulatory Visit (HOSPITAL_BASED_OUTPATIENT_CLINIC_OR_DEPARTMENT_OTHER): Payer: Medicare PPO

## 2015-03-17 ENCOUNTER — Other Ambulatory Visit: Payer: Medicare PPO

## 2015-03-17 ENCOUNTER — Encounter: Payer: Self-pay | Admitting: Hematology

## 2015-03-17 ENCOUNTER — Ambulatory Visit: Payer: Medicare PPO | Admitting: Nutrition

## 2015-03-17 ENCOUNTER — Other Ambulatory Visit: Payer: Self-pay | Admitting: *Deleted

## 2015-03-17 VITALS — BP 133/56 | HR 78 | Temp 98.5°F | Resp 18 | Ht 63.0 in | Wt 113.9 lb

## 2015-03-17 DIAGNOSIS — Z5111 Encounter for antineoplastic chemotherapy: Secondary | ICD-10-CM

## 2015-03-17 DIAGNOSIS — C801 Malignant (primary) neoplasm, unspecified: Secondary | ICD-10-CM

## 2015-03-17 DIAGNOSIS — C787 Secondary malignant neoplasm of liver and intrahepatic bile duct: Secondary | ICD-10-CM

## 2015-03-17 DIAGNOSIS — R945 Abnormal results of liver function studies: Secondary | ICD-10-CM | POA: Diagnosis not present

## 2015-03-17 DIAGNOSIS — K5900 Constipation, unspecified: Secondary | ICD-10-CM

## 2015-03-17 DIAGNOSIS — R7989 Other specified abnormal findings of blood chemistry: Secondary | ICD-10-CM

## 2015-03-17 DIAGNOSIS — C772 Secondary and unspecified malignant neoplasm of intra-abdominal lymph nodes: Secondary | ICD-10-CM

## 2015-03-17 DIAGNOSIS — R112 Nausea with vomiting, unspecified: Secondary | ICD-10-CM

## 2015-03-17 DIAGNOSIS — C259 Malignant neoplasm of pancreas, unspecified: Secondary | ICD-10-CM

## 2015-03-17 DIAGNOSIS — R7401 Elevation of levels of liver transaminase levels: Secondary | ICD-10-CM

## 2015-03-17 DIAGNOSIS — R740 Nonspecific elevation of levels of transaminase and lactic acid dehydrogenase [LDH]: Principal | ICD-10-CM

## 2015-03-17 DIAGNOSIS — E46 Unspecified protein-calorie malnutrition: Secondary | ICD-10-CM

## 2015-03-17 LAB — COMPREHENSIVE METABOLIC PANEL (CC13)
ALT: 186 U/L — ABNORMAL HIGH (ref 0–55)
AST: 204 U/L (ref 5–34)
Albumin: 2.7 g/dL — ABNORMAL LOW (ref 3.5–5.0)
Alkaline Phosphatase: 339 U/L — ABNORMAL HIGH (ref 40–150)
Anion Gap: 6 mEq/L (ref 3–11)
BILIRUBIN TOTAL: 0.58 mg/dL (ref 0.20–1.20)
BUN: 8.7 mg/dL (ref 7.0–26.0)
CO2: 22 meq/L (ref 22–29)
Calcium: 8.9 mg/dL (ref 8.4–10.4)
Chloride: 105 mEq/L (ref 98–109)
Creatinine: 0.6 mg/dL (ref 0.6–1.1)
Glucose: 110 mg/dl (ref 70–140)
Potassium: 4.3 mEq/L (ref 3.5–5.1)
Sodium: 132 mEq/L — ABNORMAL LOW (ref 136–145)
TOTAL PROTEIN: 6.7 g/dL (ref 6.4–8.3)

## 2015-03-17 LAB — CBC WITH DIFFERENTIAL/PLATELET
BASO%: 0.3 % (ref 0.0–2.0)
Basophils Absolute: 0 10*3/uL (ref 0.0–0.1)
EOS ABS: 0.3 10*3/uL (ref 0.0–0.5)
EOS%: 4.7 % (ref 0.0–7.0)
HCT: 28.8 % — ABNORMAL LOW (ref 34.8–46.6)
HEMOGLOBIN: 9.9 g/dL — AB (ref 11.6–15.9)
LYMPH%: 9.9 % — AB (ref 14.0–49.7)
MCH: 31.8 pg (ref 25.1–34.0)
MCHC: 34.4 g/dL (ref 31.5–36.0)
MCV: 92.6 fL (ref 79.5–101.0)
MONO#: 0.7 10*3/uL (ref 0.1–0.9)
MONO%: 9.4 % (ref 0.0–14.0)
NEUT#: 5.3 10*3/uL (ref 1.5–6.5)
NEUT%: 75.7 % (ref 38.4–76.8)
PLATELETS: 158 10*3/uL (ref 145–400)
RBC: 3.11 10*6/uL — AB (ref 3.70–5.45)
RDW: 14.5 % (ref 11.2–14.5)
WBC: 7.1 10*3/uL (ref 3.9–10.3)
lymph#: 0.7 10*3/uL — ABNORMAL LOW (ref 0.9–3.3)

## 2015-03-17 LAB — PROTIME-INR
INR: 1.2 — AB (ref 2.00–3.50)
Protime: 14.4 Seconds — ABNORMAL HIGH (ref 10.6–13.4)

## 2015-03-17 MED ORDER — TEGRETOL-XR 200 MG PO TB12: 200.0000 mg | ORAL_TABLET | Freq: Two times a day (BID) | ORAL | Status: AC

## 2015-03-17 MED ORDER — SODIUM CHLORIDE 0.9 % IV SOLN
1000.0000 mg/m2 | Freq: Once | INTRAVENOUS | Status: AC
  Administered 2015-03-17: 1520 mg via INTRAVENOUS
  Filled 2015-03-17: qty 39.98

## 2015-03-17 MED ORDER — SODIUM CHLORIDE 0.9 % IJ SOLN
10.0000 mL | INTRAMUSCULAR | Status: DC | PRN
  Administered 2015-03-17: 10 mL
  Filled 2015-03-17: qty 10

## 2015-03-17 MED ORDER — HEPARIN SOD (PORK) LOCK FLUSH 100 UNIT/ML IV SOLN
500.0000 [IU] | Freq: Once | INTRAVENOUS | Status: AC | PRN
  Administered 2015-03-17: 500 [IU]
  Filled 2015-03-17: qty 5

## 2015-03-17 MED ORDER — SODIUM CHLORIDE 0.9 % IV SOLN
Freq: Once | INTRAVENOUS | Status: AC
  Administered 2015-03-17: 11:00:00 via INTRAVENOUS

## 2015-03-17 MED ORDER — FOSAPREPITANT DIMEGLUMINE INJECTION 150 MG
Freq: Once | INTRAVENOUS | Status: AC
  Administered 2015-03-17: 12:00:00 via INTRAVENOUS
  Filled 2015-03-17: qty 5

## 2015-03-17 MED ORDER — PROCHLORPERAZINE MALEATE 10 MG PO TABS: 10.0000 mg | ORAL_TABLET | Freq: Three times a day (TID) | ORAL | Status: AC | PRN

## 2015-03-17 NOTE — Patient Instructions (Signed)
[facility] Discharge Instructions for Patients Receiving Chemotherapy  Today you received the following chemotherapy agents: Gemzar  To help prevent nausea and vomiting after your treatment, we encourage you to take your nausea medication as prescribed by your physician.    If you develop nausea and vomiting that is not controlled by your nausea medication, call the clinic.   BELOW ARE SYMPTOMS THAT SHOULD BE REPORTED IMMEDIATELY:  *FEVER GREATER THAN 100.5 F  *CHILLS WITH OR WITHOUT FEVER  NAUSEA AND VOMITING THAT IS NOT CONTROLLED WITH YOUR NAUSEA MEDICATION  *UNUSUAL SHORTNESS OF BREATH  *UNUSUAL BRUISING OR BLEEDING  TENDERNESS IN MOUTH AND THROAT WITH OR WITHOUT PRESENCE OF ULCERS  *URINARY PROBLEMS  *BOWEL PROBLEMS  UNUSUAL RASH Items with * indicate a potential emergency and should be followed up as soon as possible.  Feel free to call the clinic you have any questions or concerns. The clinic phone number is [phone].  Please show the CHEMO ALERT CARD at check-in to the Emergency Department and triage nurse.   

## 2015-03-17 NOTE — Progress Notes (Signed)
69 year old female diagnosed with pancreas cancer with metastases to the liver.  Past medical history includes osteopenia, hypercholesterolemia, CAD, GERD, epileptic seizures, fatigue and lactose intolerance.  Medications include Lipitor, Decadron, lactate, Prevacid, Creon, multivitamin, Zofran, Compazine, and senna.  Labs include sodium 131, and albumin 2.9.  Height: 5 feet 3 inches. Weight: 113.9 pounds. Usual body weight: 122 pounds June 2016. BMI: 20.18.  Patient reports she experiences nausea one to 2 days each week after chemotherapy. She reports diarrhea after consuming a large milkshake. She was taking Zofran twice a day instead of as prescribed. Patient eating very small amounts.  Nutrition diagnosis:  Unintended weight loss related to diagnosis of pancreas cancer and associated treatments as evidenced by 7% weight loss over 6 weeks.  Intervention:  Educated patient on strategies for improving nausea.  Provided a list of foods patient should choose during times of nausea. Encouraged medication compliance per physician prescription. Educated patient on strategies for diet for diarrhea. Reviewed importance of avoiding foods that have lactose in them and provided education. Encouraged patient to consume small amounts of food with increased calories and protein in small frequent meals and snacks. Fact sheets were provided.  Questions were answered.  Teach back method was used.  Contact information was given.  Monitoring, evaluation, goals: Patient will tolerate increased calories and protein to minimize weight loss and improve nutrition impact symptoms.  Next visit: Thursday, August 11 during infusion.    **Disclaimer: This note was dictated with voice recognition software. Similar sounding words can inadvertently be transcribed and this note may contain transcription errors which may not have been corrected upon publication of note.**

## 2015-03-17 NOTE — Progress Notes (Signed)
[name]  HEMATOLOGY ONCOLOGY PROGRESS NOTE  Date of service 03/17/2015  Patient Care Team: Shirline Frees, MD as PCP - General (Family Medicine) Brunetta Genera, MD as Consulting Physician (Hematology)  SUMMARY OF ONCOLOGIC HISTORY:   Pancreatic carcinoma metastatic to liver   02/15/2015 Imaging CT Abd IMPRESSION: 1. Widespread hepatic metastatic disease. No underlying morphologic changes of cirrhosis are demonstrated to suggest multifocal hepatocellular carcinoma. 2. No primary malignancy identified within the abdomen. The pelvis was not imaged.    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 Widespread neoplastic disease throughout the liver and lymph nodes of the upper abdomen. 2. No definite extrahepatic primary malignancy identified. 3. Potential early nodal metastases within the right internal mammary, subcarinal and left hilar station   03/07/2015 Initial Biopsy Preliminary results of liver lesion biopsy as per discussion with pathology Dr Donato Heinz consistent with metastatic pancreatic carcinoma. Final results pending additional IHC stains.   03/10/2015 -  Chemotherapy Started on Gemcitabine +  Abraxane chemotherapy cycle 1   03/17/2015 -  Chemotherapy Cycle 1 day 8. Abraxane held due to elevated LFTs likely related to excessive acetaminophen intake. Received gemcitabine.    INTERVAL HISTORY:  Judy Cummings is here for follow-up prior to her cycle 1 day 8 chemotherapy or metastatic pancreas cancer. She was seen in symptom evaluation clinic 2 days available for significant nausea, dehydration and was noted to have significantly elevated LFTs. She reported using axis and Tylenol every 4 hours as suggested by the triage RN over the weekend. She was counseled to Absolutely avoid Tylenol if possible. Her premedications were change from Zofran to [place]. Abraxane will be held today to allow for improvement in  liver functions. LFTs are improving today. She was counseled to avoid other hepatotoxic medications. Her Tegretol dose was reduced from 100 mg in a.m. 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 Note some mild oral soreness likely due to chemotherapy. Given recommendations for salt and baking soda mouthwash. Still notes some cough and postnasal drip. She'll try over-the-counter Flonase as she has done previously. Heartburn or much better. Appetite somewhat improved. Constipation resolved. Noted some diarrhea after drinking milkshake. Abdomen benign and pain well controlled currently. Will be seeing dietitian today to optimize oral intake. No fevers.   REVIEW OF SYSTEMS:   10 point review of systems was done and is negative except as noted above.  I have reviewed the past medical history, past surgical history, social history and family history with the patient and they are unchanged from previous note.  ALLERGIES:  is allergic to boniva; delsym; erythromycin; fosamax; glucosamine forte; guaifenesin & derivatives; lactose intolerance (gi); nexium; tegretol; and zostavax.  MEDICATIONS:  Current Outpatient Prescriptions  Medication Sig Dispense Refill  . acetaminophen (TYLENOL) 325 MG tablet Take 650 mg by mouth every 6 (six) hours as needed for mild pain.    [name] AMBULATORY NON FORMULARY MEDICATION Take 90 mg by mouth 2 (two) times daily. Medication Name: Brilinta 90 mg BID provided by [employer] Research study (Do Not Fill)    . AMBULATORY NON FORMULARY MEDICATION Take 81 mg by mouth daily. Medication Name: ASA 81 mg daily Provided by [facility] study    . atorvastatin (LIPITOR) 80 MG tablet Take 1 tablet (80 mg total) by mouth daily at  6 PM. 30 tablet 11  . Azelaic Acid 15 % cream Apply 1 application topically daily as needed (for acne and rosacea).     [name] dexamethasone (DECADRON) 2 MG tablet Take 1  tablet (2 mg total) by mouth 2 (two) times daily with a meal. 30 tablet 0  . feeding supplement (BOOST / RESOURCE BREEZE) LIQD Take 1 Container by mouth daily.  0  . fluticasone (FLONASE) 50 MCG/ACT nasal spray Place 1 spray into both nostrils daily as needed for allergies.     [name] lactase (LACTAID) 3000 UNITS tablet Take 1 tablet by mouth daily as needed (for lactose intolerance).    . lansoprazole (PREVACID) 30 MG capsule Take 1 capsule (30 mg total) by mouth daily at 12 noon. 30 capsule 0  . lipase/protease/amylase (CREON) 12000 UNITS CPEP capsule Take 1 capsule (12,000 Units total) by mouth 3 (three) times daily before meals. 270 capsule 1  . losartan (COZAAR) 25 MG tablet Take 1 tablet (25 mg total) by mouth daily. 30 tablet 6  . metoprolol tartrate (LOPRESSOR) 25 MG tablet Take 0.5 tablets (12.5 mg total) by mouth 2 (two) times daily. 60 tablet 4  . Multiple Vitamins-Minerals (MULTIVITAMIN & MINERAL PO) Take 1 tablet by mouth at bedtime.     . ondansetron (ZOFRAN) 8 MG tablet Take 1 tablet (8 mg total) by mouth every 8 (eight) hours as needed for nausea or vomiting. 30 tablet 3  . oxyCODONE (OXY IR/ROXICODONE) 5 MG immediate release tablet Take 1 tablet (5 mg total) by mouth every 4 (four) hours as needed for severe pain. (Patient taking differently: Take 5 mg by mouth every 4 (four) hours as needed for severe pain (for cough). ) 60 tablet 0  . prochlorperazine (COMPAZINE) 10 MG tablet Take 1 tablet (10 mg total) by mouth every 8 (eight) hours as needed for refractory nausea / vomiting (if nausea not responding to zofran). 30 tablet 0  . senna-docusate (SENNA S) 8.6-50 MG per tablet Take 2 tablets by mouth at bedtime. May increase to 2 tab twice daily if constipation persistent 60 tablet 1  . TEGRETOL-XR 200 MG 12 hr tablet Take 1 tablet (200 mg total) by mouth 2 (two) times daily. 60 tablet 2  . zaleplon (SONATA) 5 MG capsule Take 5 mg by mouth at bedtime as needed for sleep.     No current  facility-administered medications for this visit.   Facility-Administered Medications Ordered in Other Visits  Medication Dose Route Frequency Provider Last Rate Last Dose  . sodium chloride 0.9 % injection 10 mL  10 mL Intracatheter PRN Brunetta Genera, MD   10 mL at 03/17/15 1320    PHYSICAL EXAMINATION: ECOG PERFORMANCE STATUS: 1 - Symptomatic but completely ambulatory  Filed Vitals:   03/17/15 0941  BP: 133/56  Pulse: 78  Temp: 98.5 F (36.9 C)  Resp: 18   Filed Weights   03/17/15 0941  Weight: 113 lb 14.4 oz (51.665 kg)    GENERAL:alert, no distress and comfortable SKIN: skin color, texture, turgor are normal, no rashes or significant lesions EYES: normal, Conjunctiva are pink and non-injected, sclera clear OROPHARYNX:no exudate, no erythema and lips, buccal mucosa, and tongue normal  NECK: supple, thyroid normal size, non-tender, without nodularity LYMPH:  no palpable lymphadenopathy in the cervical, axillary or inguinal LUNGS: clear to auscultation and percussion with normal breathing effort HEART: regular rate & rhythm and no murmurs and no lower extremity edema ABDOMEN:abdomen soft, non-tender and normal bowel sounds Musculoskeletal:no cyanosis  of digits and no clubbing  NEURO: alert & oriented x 3 with fluent speech, no focal motor/sensory deficits  LABORATORY DATA:  I have reviewed the data as listed    Component Value Date/Time   NA 132* 03/17/2015 1050   NA 135 03/05/2015 0330   K 4.3 03/17/2015 1050   K 4.9 03/05/2015 0330   CL 102 03/05/2015 0330   CO2 22 03/17/2015 1050   CO2 27 03/05/2015 0330   GLUCOSE 110 03/17/2015 1050   GLUCOSE 120* 03/05/2015 0330   BUN 8.7 03/17/2015 1050   BUN 7 03/05/2015 0330   CREATININE 0.6 03/17/2015 1050   CREATININE 0.60 03/05/2015 0330   CALCIUM 8.9 03/17/2015 1050   CALCIUM 8.8* 03/05/2015 0330   PROT 6.7 03/17/2015 1050   PROT 6.8 03/02/2015 1513   ALBUMIN 2.7* 03/17/2015 1050   ALBUMIN 3.4* 03/02/2015  1513   AST 204* 03/17/2015 1050   AST 125* 03/02/2015 1513   ALT 186* 03/17/2015 1050   ALT 80* 03/02/2015 1513   ALKPHOS 339* 03/17/2015 1050   ALKPHOS 226* 03/02/2015 1513   BILITOT 0.58 03/17/2015 1050   BILITOT 0.5 03/02/2015 1513   GFRNONAA >60 03/05/2015 0330   GFRAA >60 03/05/2015 0330   . CMP Latest Ref Rng 03/17/2015 03/15/2015 03/10/2015  Glucose 70 - 140 mg/dl 110 136 134  BUN 7.0 - 26.0 mg/dL 8.7 14.6 12.1  Creatinine 0.6 - 1.1 mg/dL 0.6 0.8 0.6  Sodium 136 - 145 mEq/L 132(L) 131(L) 134(L)  Potassium 3.5 - 5.1 mEq/L 4.3 4.3 3.9  Chloride 101 - 111 mmol/L - - -  CO2 22 - 29 mEq/L 22 24 25   Calcium 8.4 - 10.4 mg/dL 8.9 9.1 9.4  Total Protein 6.4 - 8.3 g/dL 6.7 7.0 7.0  Total Bilirubin 0.20 - 1.20 mg/dL 0.58 0.78 0.52  Alkaline Phos 40 - 150 U/L 339(H) 290(H) 297(H)  AST 5 - 34 U/L 204(HH) 348(HH) 65(H)  ALT 0 - 55 U/L 186(H) 235(H) 54    Lab Results  Component Value Date   WBC 7.1 03/17/2015   NEUTROABS 5.3 03/17/2015   HGB 9.9* 03/17/2015   HCT 28.8* 03/17/2015   MCV 92.6 03/17/2015   PLT 158 03/17/2015       RADIOGRAPHIC STUDIES: I have personally reviewed the radiological images as listed and agreed with the findings in the report. No results found.   ASSESSMENT & PLAN:   1) Pancreatic carcinoma metastatic to the liver.  Pathology report finalized as noted above. Is here for follow-up prior to her cycle 1 day 8 of gemcitabine/Abraxane palliative chemotherapy for metastatic pancreatic carcinoma. Notes grade 1 acute neuropathy from Abraxane which is resolved. Noted grade 2 persistent nausea. Mild grade 1 mucositis.  2)Elevated liver function tests - This might be likely due to Tylenol excess use or liver inflammation due to tumor breakdown. Less likely Abraxane toxicity. Plan -Patient will proceed with chemotherapy with gemcitabine alone today. We'll hold Abraxane dose today to allow for improvement in LFts -Avoid acetaminophen use -Will reassess Abraxane  dose for day 15 -No alcohol use. 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 -Continued goals of care discussion and addressed her expectations and upcoming plans to allow her to do what is important to her while being on palliative chemotherapy  3) Nausea and vomiting  -Switched Zofran to [place] as premedications for chemotherapy given significant bother  with nausea with cycle 1 day 1 of treatment. -When necessary Zofran and Compazine for nausea at home. - Has been on PPIs for heartburns which are much better. -Dexamethasone for 2 days post chemotherapy.  Protein calorie malnutrition -Will be seen by nutritional therapy today in infusion room. -Continue lactase and pancreatic enzyme supplements to help avoid significant dyspeptic symptoms.  Pain management -Continue oxycodone when necessary. Patient not needing scheduled long-acting medications at this time but will closely monitor to optimize symptom control.  Constipation  Constipation appears to resolved and she had some loose stools with no abdominal pain or cramping, no blood in stools. -Continue senna S as needed along with opiates. Hold if diarrhea ensues. - if Persistent diarrhea might need to get a C. difficile test given recent antibiotic use.  All questions were answered. The patient knows to call the clinic with any problems, questions or concerns.  I spent 25 minutes counseling the patient face to face. The total time spent in the appointment was 35 minutes and more than 50% was on counseling and review of test results   Sullivan Lone MD [place]  (Office):       [phone] (Work cell):  [phone] (Fax):           [phone]

## 2015-03-17 NOTE — Progress Notes (Signed)
Per Darden Dates RN w/ Dr. Irene Limbo: Ok to treat with CMET. Hold Abraxane today. Patient will only receive Gemzar per MD Irene Limbo.

## 2015-03-17 NOTE — Progress Notes (Signed)
Ok to treat with CMET. Hold Abraxane today. Patient will only receive Gemzar per MD Irene Limbo.

## 2015-03-18 LAB — APTT: aPTT: 29 seconds (ref 24–37)

## 2015-03-19 ENCOUNTER — Other Ambulatory Visit: Payer: Self-pay

## 2015-03-19 ENCOUNTER — Emergency Department (HOSPITAL_COMMUNITY)
Admission: EM | Admit: 2015-03-19 | Discharge: 2015-03-19 | Disposition: A | Discharge status: Home / Self Care | Payer: Medicare PPO | Attending: Emergency Medicine | Admitting: Emergency Medicine

## 2015-03-19 ENCOUNTER — Emergency Department (HOSPITAL_COMMUNITY): Payer: Medicare PPO

## 2015-03-19 ENCOUNTER — Encounter (HOSPITAL_COMMUNITY): Payer: Self-pay | Admitting: Emergency Medicine

## 2015-03-19 DIAGNOSIS — R509 Fever, unspecified: Secondary | ICD-10-CM | POA: Diagnosis present

## 2015-03-19 DIAGNOSIS — Z872 Personal history of diseases of the skin and subcutaneous tissue: Secondary | ICD-10-CM | POA: Diagnosis not present

## 2015-03-19 DIAGNOSIS — Z8742 Personal history of other diseases of the female genital tract: Secondary | ICD-10-CM | POA: Insufficient documentation

## 2015-03-19 DIAGNOSIS — I2510 Atherosclerotic heart disease of native coronary artery without angina pectoris: Secondary | ICD-10-CM | POA: Diagnosis not present

## 2015-03-19 DIAGNOSIS — Z9861 Coronary angioplasty status: Secondary | ICD-10-CM | POA: Insufficient documentation

## 2015-03-19 DIAGNOSIS — K219 Gastro-esophageal reflux disease without esophagitis: Secondary | ICD-10-CM | POA: Insufficient documentation

## 2015-03-19 DIAGNOSIS — Z9889 Other specified postprocedural states: Secondary | ICD-10-CM | POA: Insufficient documentation

## 2015-03-19 DIAGNOSIS — E780 Pure hypercholesterolemia: Secondary | ICD-10-CM | POA: Diagnosis not present

## 2015-03-19 DIAGNOSIS — C787 Secondary malignant neoplasm of liver and intrahepatic bile duct: Secondary | ICD-10-CM | POA: Insufficient documentation

## 2015-03-19 DIAGNOSIS — Z8739 Personal history of other diseases of the musculoskeletal system and connective tissue: Secondary | ICD-10-CM | POA: Diagnosis not present

## 2015-03-19 DIAGNOSIS — C259 Malignant neoplasm of pancreas, unspecified: Secondary | ICD-10-CM | POA: Diagnosis not present

## 2015-03-19 DIAGNOSIS — E739 Lactose intolerance, unspecified: Secondary | ICD-10-CM | POA: Insufficient documentation

## 2015-03-19 DIAGNOSIS — Z79899 Other long term (current) drug therapy: Secondary | ICD-10-CM | POA: Diagnosis not present

## 2015-03-19 LAB — CBC WITH DIFFERENTIAL/PLATELET
Basophils Absolute: 0 10*3/uL (ref 0.0–0.1)
Basophils Relative: 0 % (ref 0–1)
EOS ABS: 0.4 10*3/uL (ref 0.0–0.7)
Eosinophils Relative: 5 % (ref 0–5)
HEMATOCRIT: 29.8 % — AB (ref 36.0–46.0)
HEMOGLOBIN: 10.5 g/dL — AB (ref 12.0–15.0)
Lymphocytes Relative: 4 % — ABNORMAL LOW (ref 12–46)
Lymphs Abs: 0.3 10*3/uL — ABNORMAL LOW (ref 0.7–4.0)
MCH: 32.8 pg (ref 26.0–34.0)
MCHC: 35.2 g/dL (ref 30.0–36.0)
MCV: 93.1 fL (ref 78.0–100.0)
Monocytes Absolute: 0 10*3/uL — ABNORMAL LOW (ref 0.1–1.0)
Monocytes Relative: 1 % — ABNORMAL LOW (ref 3–12)
Neutro Abs: 7.4 10*3/uL (ref 1.7–7.7)
Neutrophils Relative %: 90 % — ABNORMAL HIGH (ref 43–77)
PLATELETS: 195 10*3/uL (ref 150–400)
RBC: 3.2 MIL/uL — AB (ref 3.87–5.11)
RDW: 14.5 % (ref 11.5–15.5)
WBC: 8.2 10*3/uL (ref 4.0–10.5)

## 2015-03-19 LAB — COMPREHENSIVE METABOLIC PANEL
ALK PHOS: 308 U/L — AB (ref 38–126)
ALT: 184 U/L — ABNORMAL HIGH (ref 14–54)
ANION GAP: 4 — AB (ref 5–15)
AST: 260 U/L — ABNORMAL HIGH (ref 15–41)
Albumin: 3.2 g/dL — ABNORMAL LOW (ref 3.5–5.0)
BUN: 12 mg/dL (ref 6–20)
CALCIUM: 8.8 mg/dL — AB (ref 8.9–10.3)
CO2: 24 mmol/L (ref 22–32)
Chloride: 100 mmol/L — ABNORMAL LOW (ref 101–111)
Creatinine, Ser: 0.62 mg/dL (ref 0.44–1.00)
GFR calc Af Amer: >60 mL/min (ref >60)
GFR calc non Af Amer: >60 mL/min (ref >60)
GLUCOSE: 95 mg/dL (ref 65–99)
POTASSIUM: 4.6 mmol/L (ref 3.5–5.1)
Sodium: 128 mmol/L — ABNORMAL LOW (ref 135–145)
Total Bilirubin: 1.2 mg/dL (ref 0.3–1.2)
Total Protein: 7.1 g/dL (ref 6.5–8.1)

## 2015-03-19 LAB — URINE MICROSCOPIC-ADD ON

## 2015-03-19 LAB — URINALYSIS, ROUTINE W REFLEX MICROSCOPIC
Bilirubin Urine: NEGATIVE
Glucose, UA: NEGATIVE mg/dL
KETONES UR: NEGATIVE mg/dL
LEUKOCYTES UA: NEGATIVE
NITRITE: NEGATIVE
PH: 6 (ref 5.0–8.0)
PROTEIN: NEGATIVE mg/dL
SPECIFIC GRAVITY, URINE: 1.007 (ref 1.005–1.030)
Urobilinogen, UA: 0.2 mg/dL (ref 0.0–1.0)

## 2015-03-19 MED ORDER — ONDANSETRON HCL 4 MG/2ML IJ SOLN
4.0000 mg | Freq: Once | INTRAMUSCULAR | Status: AC
  Administered 2015-03-19: 4 mg via INTRAVENOUS
  Filled 2015-03-19: qty 2

## 2015-03-19 MED ORDER — ACETAMINOPHEN 325 MG PO TABS
650.0000 mg | ORAL_TABLET | Freq: Once | ORAL | Status: DC
  Filled 2015-03-19: qty 2

## 2015-03-19 MED ORDER — SODIUM CHLORIDE 0.9 % IV BOLUS (SEPSIS)
1000.0000 mL | Freq: Once | INTRAVENOUS | Status: AC
  Administered 2015-03-19: 1000 mL via INTRAVENOUS

## 2015-03-19 MED ORDER — IBUPROFEN 200 MG PO TABS
600.0000 mg | ORAL_TABLET | Freq: Once | ORAL | Status: AC
  Administered 2015-03-19: 600 mg via ORAL
  Filled 2015-03-19: qty 3

## 2015-03-19 MED ORDER — HEPARIN SOD (PORK) LOCK FLUSH 100 UNIT/ML IV SOLN
500.0000 [IU] | Freq: Once | INTRAVENOUS | Status: AC
  Administered 2015-03-19: 500 [IU]
  Filled 2015-03-19: qty 5

## 2015-03-19 NOTE — ED Notes (Signed)
Pt asked to give urine but don't have the urge to void. Nurse Aware

## 2015-03-19 NOTE — ED Notes (Signed)
Patient transported to X-ray 

## 2015-03-19 NOTE — ED Provider Notes (Signed)
CSN: [id number]     Arrival date & time 03/19/15  0741 History   First MD Initiated Contact with Patient 03/19/15 [phone]     Chief Complaint  Patient presents with  . Fever  . Cough  . Pancreatic Cancer     (Consider location/radiation/quality/duration/timing/severity/associated sxs/prior Treatment) Patient is a 69 y.o. female presenting with fever and cough. The history is provided by the patient.  Fever Temp source:  Oral Severity:  Severe Onset quality:  Sudden Duration:  2 days Timing:  Constant Progression:  Worsening Chronicity:  Recurrent Relieved by:  Nothing Worsened by:  Nothing tried Ineffective treatments:  None tried Associated symptoms: chills, congestion, cough and myalgias   Associated symptoms: no chest pain, no dysuria, no headaches, no nausea, no rhinorrhea and no vomiting   Risk factors: hx of cancer and immunosuppression   Cough Associated symptoms: chills and myalgias   Associated symptoms: no chest pain, no fever, no headaches, no rhinorrhea, no shortness of breath and no wheezing    69 yo F with a chief complaint of fever. This started a couple days ago. Patient has a history of pancreatic cancer is currently undergoing chemotherapy. Patient's last chemotherapy was 2 days ago. Patient having fever as high as 101 at home. Patient's had a cough for the past month. Patient denies abdominal pain denies dysuria denies nausea vomiting or diarrhea. States that she's had some postnasal drip and is coughing up some darkish looking sputum.  Past Medical History  Diagnosis Date  . Osteopenia   . Hypercholesterolemia   . Menopause   . Rosacea   . Lactose intolerance   . TMJ (dislocation of temporomandibular joint)   . Fatigue   . Rosacea   . TMJ (dislocation of temporomandibular joint)   . Mild aortic stenosis     echo 01/2015  . Coronary artery disease   . Family history of adverse reaction to anesthesia     "my mother gets PONV"  . GERD (gastroesophageal  reflux disease)   . Epileptic seizure     "controlled; started w/my periods; last one was years ago" (02/07/2015)  . Sleep disorder   . 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 CA 19-9 levels On 02/21/2015 were noted to be 11,800 making pancreatic adenocarcinoma the likely primary site.    Past Surgical History  Procedure Laterality Date  . Tonsillectomy    . Coronary angioplasty    . Ankle fracture surgery Left ~ 1977    "put 4 pins in"  . Hemangioma excision  ~ 2006    "inside my mouth"  . Hemangioma w/ laser excision  "yearly since ~ 2006"    "inside my mouth"  . Cardiac catheterization N/A 02/07/2015    Procedure: Left Heart Cath and Coronary Angiography;  Surgeon: Burnell Blanks, MD;  Location: [facility];  Service: Cardiovascular;  Laterality: N/A;  . Cardiac catheterization N/A 02/07/2015    Procedure: Coronary Stent Intervention;  Surgeon: Burnell Blanks, MD;  Location: [facility];  Service: Cardiovascular;  Laterality: N/A;   Family History  Problem Relation Age of Onset  . Diabetes Mellitus I Mother   . Hypertension Mother   . Atrial fibrillation Mother   . Heart attack Father   . CVA Father   . CAD Father   . Hyperlipidemia Sister   .  Heart attack Maternal Grandfather   . CVA Paternal Grandmother   . Colon cancer Paternal Grandfather    History  Substance Use Topics  . Smoking status: Never Smoker   . Smokeless tobacco: Never Used  . Alcohol Use: No   OB History    No data available     Review of Systems  Constitutional: Positive for chills. Negative for fever.  HENT: Positive for congestion. Negative for rhinorrhea.   Eyes: Negative for redness and visual disturbance.  Respiratory: Positive for cough. Negative for shortness of breath and wheezing.    Cardiovascular: Negative for chest pain and palpitations.  Gastrointestinal: Negative for nausea and vomiting.  Genitourinary: Negative for dysuria and urgency.  Musculoskeletal: Positive for myalgias. Negative for arthralgias.  Skin: Negative for pallor and wound.  Neurological: Negative for dizziness and headaches.      Allergies  Boniva; Delsym; Erythromycin; Fosamax; Glucosamine forte; Guaifenesin & derivatives; Lactose intolerance (gi); Nexium; Tegretol; and Zostavax  Home Medications   Prior to Admission medications   Medication Sig Start Date End Date Taking? Authorizing Provider  AMBULATORY NON FORMULARY MEDICATION Take 90 mg by mouth 2 (two) times daily. Medication Name: Brilinta 90 mg BID provided by [employer] Research study (Do Not Fill) 02/09/15  Yes Burnell Blanks, MD  AMBULATORY NON FORMULARY MEDICATION Take 81 mg by mouth daily. Medication Name: ASA 81 mg daily Provided by [facility] study 02/25/15  Yes Burnell Blanks, MD  atorvastatin (LIPITOR) 80 MG tablet Take 1 tablet (80 mg total) by mouth daily at 6 PM. 02/08/15  Yes Brett Canales, PA-C  Azelaic Acid 15 % cream Apply 1 application topically daily as needed (for acne and rosacea).    Yes Historical Provider, MD  dexamethasone (DECADRON) 2 MG tablet Take 1 tablet (2 mg total) by mouth 2 (two) times daily with a meal. Patient taking differently: Take 2 mg by mouth 2 (two) times daily with a meal. Take for 3 days after chemo treatment. 03/01/15  Yes Gautam Juleen China, MD  feeding supplement (BOOST / RESOURCE BREEZE) LIQD Take 1 Container by mouth daily. 03/09/15  Yes Rhonda G Barrett, PA-C  fluticasone (FLONASE) 50 MCG/ACT nasal spray Place 1 spray into both nostrils daily as needed for allergies.    Yes Historical Provider, MD  lactase (LACTAID) 3000 UNITS tablet Take 1 tablet by mouth daily as needed (for lactose intolerance).   Yes Historical Provider, MD  lansoprazole (PREVACID) 30 MG capsule Take 1 capsule  (30 mg total) by mouth daily at 12 noon. 03/14/15  Yes Brunetta Genera, MD  lipase/protease/amylase (CREON) 12000 UNITS CPEP capsule Take 1 capsule (12,000 Units total) by mouth 3 (three) times daily before meals. 03/01/15 04/01/15 Yes Gautam Juleen China, MD  losartan (COZAAR) 25 MG tablet Take 1 tablet (25 mg total) by mouth daily. 03/09/15  Yes Rhonda G Barrett, PA-C  metoprolol tartrate (LOPRESSOR) 25 MG tablet Take 0.5 tablets (12.5 mg total) by mouth 2 (two) times daily. 02/08/15  Yes Brett Canales, PA-C  Multiple Vitamins-Minerals (MULTIVITAMIN & MINERAL PO) Take 1 tablet by mouth at bedtime.    Yes Historical Provider, MD  ondansetron (ZOFRAN) 8 MG tablet Take 1 tablet (8 mg total) by mouth every 8 (eight) hours as needed for nausea or vomiting. 03/01/15  Yes Brunetta Genera, MD  oxyCODONE (OXY IR/ROXICODONE) 5 MG immediate release tablet Take 1 tablet (5 mg total) by mouth every 4 (four) hours as needed for severe pain. Patient taking differently:  Take 5 mg by mouth every 4 (four) hours as needed for severe pain (for cough).  03/01/15  Yes Omer, MD  [place]   Yes Historical Provider, MD  prochlorperazine (COMPAZINE) 10 MG tablet Take 1 tablet (10 mg total) by mouth every 8 (eight) hours as needed for refractory nausea / vomiting (if nausea not responding to zofran). 03/17/15  Yes Brunetta Genera, MD  senna-docusate (SENNA S) 8.6-50 MG per tablet Take 2 tablets by mouth at bedtime. May increase to 2 tab twice daily if constipation persistent 03/14/15  Yes Gautam Juleen China, MD  TEGRETOL-XR 200 MG 12 hr tablet Take 1 tablet (200 mg total) by mouth 2 (two) times daily. 03/17/15  Yes Brunetta Genera, MD  zaleplon (SONATA) 5 MG capsule Take 5 mg by mouth at bedtime as needed for sleep.   Yes Historical Provider, MD  acetaminophen (TYLENOL) 325 MG tablet Take 650 mg by mouth every 6 (six) hours as needed for mild pain.    Historical Provider, MD   BP  126/63 mmHg  Pulse 81  Temp(Src) 99 F (37.2 C) (Oral)  Resp 18  SpO2 100% Physical Exam  Constitutional: She is oriented to person, place, and time. She appears well-developed and well-nourished. No distress.  HENT:  Head: Normocephalic and atraumatic.  Eyes: EOM are normal. Pupils are equal, round, and reactive to light.  Neck: Normal range of motion. Neck supple.  Cardiovascular: Normal rate and regular rhythm.  Exam reveals no gallop and no friction rub.   No murmur heard. Pulmonary/Chest: Effort normal. She has no wheezes. She has no rales.  Abdominal: Soft. She exhibits no distension. There is no tenderness. There is no rebound and no guarding.  Musculoskeletal: She exhibits no edema or tenderness.  Neurological: She is alert and oriented to person, place, and time.  Skin: Skin is warm and dry. She is not diaphoretic.  Psychiatric: She has a normal mood and affect. Her behavior is normal.    ED Course  Procedures (including critical care time) Labs Review Labs Reviewed  URINALYSIS, ROUTINE W REFLEX MICROSCOPIC (NOT AT [facility]) - Abnormal; Notable for the following:    Hgb urine dipstick TRACE (*)    All other components within normal limits  CBC WITH DIFFERENTIAL/PLATELET - Abnormal; Notable for the following:    RBC 3.20 (*)    Hemoglobin 10.5 (*)    HCT 29.8 (*)    Neutrophils Relative % 90 (*)    Lymphocytes Relative 4 (*)    Lymphs Abs 0.3 (*)    Monocytes Relative 1 (*)    Monocytes Absolute 0.0 (*)    All other components within normal limits  COMPREHENSIVE METABOLIC PANEL - Abnormal; Notable for the following:    Sodium 128 (*)    Chloride 100 (*)    Calcium 8.8 (*)    Albumin 3.2 (*)    AST 260 (*)    ALT 184 (*)    Alkaline Phosphatase 308 (*)    Anion gap 4 (*)    All other components within normal limits  URINE CULTURE  CULTURE, BLOOD (ROUTINE X 2)  CULTURE, BLOOD (ROUTINE X 2)  URINE MICROSCOPIC-ADD ON    Imaging Review Dg Chest 2  View  03/19/2015   CLINICAL DATA:  Cough and fever. History of pancreatic carcinoma with ongoing chemotherapy.  EXAM: CHEST - 2 VIEW  COMPARISON:  02/18/2015  FINDINGS: The heart size and mediastinal contours are within normal limits.  Port-A-Cath present with catheter tip at the cavoatrial junction. There is no evidence of pulmonary edema, consolidation, pneumothorax, nodule or pleural fluid. The visualized skeletal structures are unremarkable. Stable coronary artery stents.  IMPRESSION: No active disease.   Electronically Signed   By: Aletta Edouard M.D.   On: 03/19/2015 09:04     EKG Interpretation None      MDM   Final diagnoses:  Fever, unspecified fever cause    69 yo F with a chief complaint of fever. Patient currently undergoing chemotherapy. We'll ensure not neutropenic. Chest x-ray urine urine culture blood cultures.  Patient given 3 L of fluid with improvement. Patient with mild hyponatremia hypochloremia. Mild elevated LFTs. At baseline. Patient able to tolerate by mouth difficulty. Feeling much better after Motrin. UA and chest x-ray negative for infection.  3:33 PM:  I have discussed the diagnosis/risks/treatment options with the patient and family and believe the pt to be eligible for discharge home to follow-up with PCP. We also discussed returning to the ED immediately if new or worsening sx occur. We discussed the sx which are most concerning (e.g., worsening fever, unable to tolerate po) that necessitate immediate return. Medications administered to the patient during their visit and any new prescriptions provided to the patient are listed below.  Medications given during this visit Medications  sodium chloride 0.9 % bolus 1,000 mL (0 mLs Intravenous Stopped 03/19/15 0948)  ibuprofen (ADVIL,MOTRIN) tablet 600 mg (600 mg Oral Given 03/19/15 0844)  sodium chloride 0.9 % bolus 1,000 mL (0 mLs Intravenous Stopped 03/19/15 1227)  sodium chloride 0.9 % bolus 1,000 mL (0 mLs  Intravenous Stopped 03/19/15 1331)  ondansetron (ZOFRAN) injection 4 mg (4 mg Intravenous Given 03/19/15 1234)  heparin lock flush 100 unit/mL (500 Units Intracatheter Given 03/19/15 1445)    Discharge Medication List as of 03/19/2015  2:33 PM       The patient appears reasonably screen and/or stabilized for discharge and I doubt any other medical condition or other [facility] requiring further screening, evaluation, or treatment in the ED at this time prior to discharge.    Deno Etienne, DO 03/19/15 1533

## 2015-03-19 NOTE — ED Notes (Addendum)
Pt. assisted to bathroom and unable to void. Nurse aware

## 2015-03-19 NOTE — ED Notes (Signed)
Patient transported to X-ray, will get second set of blood cultures upon return

## 2015-03-19 NOTE — Discharge Instructions (Signed)
Fever, Adult °A fever is a temperature of 100.4° F (38° C) or above.  °HOME CARE °· Take fever medicine as told by your doctor. Do not  take aspirin for fever if you are younger than 67 years of age. °· If you are given antibiotic medicine, take it as told. Finish the medicine even if you start to feel better. °· Rest. °· Drink enough fluids to keep your pee (urine) clear or pale yellow. Do not drink alcohol. °· Take a bath or shower with room temperature water. Do not use ice water or alcohol sponge baths. °· Wear lightweight, loose clothes. °GET HELP RIGHT AWAY IF:  °· You are short of breath or have trouble breathing. °· You are very weak. °· You are dizzy or you pass out (faint). °· You are very thirsty or are making little or no urine. °· You have new pain. °· You throw up (vomit) or have watery poop (diarrhea). °· You keep throwing up or having watery poop for more than 1 to 2 days. °· You have a stiff neck or light bothers your eyes. °· You have a skin rash. °· You have a fever or problems (symptoms) that last for more than 2 to 3 days. °· You have a fever and your problems quickly get worse. °· You keep throwing up the fluids you drink. °· You do not feel better after 3 days. °· You have new problems. °MAKE SURE YOU:  °· Understand these instructions. °· Will watch your condition. °· Will get help right away if you are not doing well or get worse. °Document Released: 05/08/2008 Document Revised: 10/22/2011 Document Reviewed: 05/31/2011 °ExitCare® Patient Information ©2015 [employer]. This information is not intended to replace advice given to you by your health care provider. Make sure you discuss any questions you have with your health care provider. ° °

## 2015-03-19 NOTE — ED Notes (Signed)
Pt w/ last chemo on Thursday woke this morning with a fever of 101.  C/o cough x 3 wks.  Hx of pancreatic cancer.  States that she has only had 2 treatments so far.  Denies NVD.  Constipated since Wednesday.  Last BM then.

## 2015-03-20 LAB — URINE CULTURE

## 2015-03-21 ENCOUNTER — Other Ambulatory Visit: Payer: Self-pay | Admitting: Hematology

## 2015-03-21 ENCOUNTER — Telehealth: Payer: Self-pay | Admitting: *Deleted

## 2015-03-21 DIAGNOSIS — R509 Fever, unspecified: Secondary | ICD-10-CM

## 2015-03-21 MED ORDER — AMOXICILLIN-POT CLAVULANATE 875-125 MG PO TABS: 1.0000 | ORAL_TABLET | Freq: Two times a day (BID) | ORAL | Status: DC

## 2015-03-21 MED ORDER — CIPROFLOXACIN HCL 500 MG PO TABS: 500.0000 mg | ORAL_TABLET | Freq: Two times a day (BID) | ORAL | Status: DC

## 2015-03-21 NOTE — Telephone Encounter (Signed)
TC from patient. She sates she went to the ED on Saturday d/t fever of 102. She was there most of the day, received 3 liters of fluids. Today she states she has noticed some edema in her feet and ankles. Denies SOB. Last night temp was 100. No fever today. Denies nausea/vomiting/diarrhea.  She is not sleeping well and states 'my nerves are on edge'. She is attributing this to Ibuprofen (was told not to take Acetaminophen d/t elevated liver enzymes)  More likely d/t over condition, not sleeping well. Encouraged increased fluid intake, adequate rest periods during the day though not too close to her normal bedtime.   Pt has appt on Thursday for labs and chemo. She would like to see  Dr. Irene Limbo that day also but has no appt to see him scheduled.

## 2015-03-22 ENCOUNTER — Telehealth: Payer: Self-pay | Admitting: Hematology

## 2015-03-22 NOTE — Telephone Encounter (Signed)
per pof to add MD appt to pt appt on 8/11-cld & spoke to pt and gave pt time & dtae-pt understood-pt asked to spk w/Dr Irene Limbo nurse-trans to her extention

## 2015-03-23 ENCOUNTER — Telehealth: Payer: Self-pay | Admitting: *Deleted

## 2015-03-23 NOTE — Telephone Encounter (Signed)
Oncology Nurse Navigator Documentation  Oncology Nurse Navigator Flowsheets 03/23/2015  Navigator Encounter Type Telephone--1 week follow up  Patient Visit Type -  Treatment Phase Treatment -Gemzar/Abraxane  Barriers/Navigation Needs -  Education -  Interventions -  Education Method -  Support Groups/Services -  Time Spent with Patient -  Left VM for patient to call with update on her status (ie: fever, eating)

## 2015-03-23 NOTE — Telephone Encounter (Signed)
Oncology Nurse Navigator Documentation  Oncology Nurse Navigator Flowsheets 03/23/2015  Navigator Encounter Type Telephone--  Patient Visit Type -  Treatment Phase Treatment-Gemzar/Abraxane  Barriers/Navigation Needs No barriers at this time  Education -  Interventions -  Education Method -  Support Groups/Services -  Time Spent with Patient 10  Reports extreme fatigue, coughing, swelling in feet. No further fever episodes since ER visit.

## 2015-03-24 ENCOUNTER — Encounter: Payer: Medicare PPO | Admitting: Nutrition

## 2015-03-24 ENCOUNTER — Other Ambulatory Visit: Payer: Medicare PPO

## 2015-03-24 ENCOUNTER — Ambulatory Visit (HOSPITAL_BASED_OUTPATIENT_CLINIC_OR_DEPARTMENT_OTHER): Payer: Medicare PPO | Admitting: Hematology

## 2015-03-24 ENCOUNTER — Other Ambulatory Visit: Payer: Self-pay | Admitting: Hematology

## 2015-03-24 ENCOUNTER — Other Ambulatory Visit (HOSPITAL_BASED_OUTPATIENT_CLINIC_OR_DEPARTMENT_OTHER): Payer: Medicare PPO

## 2015-03-24 ENCOUNTER — Encounter: Payer: Self-pay | Admitting: Hematology

## 2015-03-24 ENCOUNTER — Ambulatory Visit: Payer: Medicare PPO

## 2015-03-24 ENCOUNTER — Telehealth: Payer: Self-pay | Admitting: Hematology

## 2015-03-24 ENCOUNTER — Telehealth: Payer: Self-pay | Admitting: Cardiology

## 2015-03-24 ENCOUNTER — Other Ambulatory Visit: Payer: Self-pay | Admitting: *Deleted

## 2015-03-24 ENCOUNTER — Telehealth: Payer: Self-pay

## 2015-03-24 VITALS — BP 146/63 | HR 99 | Temp 98.6°F | Resp 18 | Ht 63.0 in | Wt 115.9 lb

## 2015-03-24 DIAGNOSIS — C787 Secondary malignant neoplasm of liver and intrahepatic bile duct: Secondary | ICD-10-CM

## 2015-03-24 DIAGNOSIS — R112 Nausea with vomiting, unspecified: Secondary | ICD-10-CM | POA: Diagnosis not present

## 2015-03-24 DIAGNOSIS — I10 Essential (primary) hypertension: Secondary | ICD-10-CM

## 2015-03-24 DIAGNOSIS — R945 Abnormal results of liver function studies: Secondary | ICD-10-CM | POA: Diagnosis not present

## 2015-03-24 DIAGNOSIS — C259 Malignant neoplasm of pancreas, unspecified: Secondary | ICD-10-CM | POA: Diagnosis not present

## 2015-03-24 DIAGNOSIS — K5900 Constipation, unspecified: Secondary | ICD-10-CM

## 2015-03-24 DIAGNOSIS — E8809 Other disorders of plasma-protein metabolism, not elsewhere classified: Secondary | ICD-10-CM

## 2015-03-24 DIAGNOSIS — M7989 Other specified soft tissue disorders: Secondary | ICD-10-CM

## 2015-03-24 DIAGNOSIS — C801 Malignant (primary) neoplasm, unspecified: Secondary | ICD-10-CM | POA: Diagnosis not present

## 2015-03-24 DIAGNOSIS — E46 Unspecified protein-calorie malnutrition: Secondary | ICD-10-CM

## 2015-03-24 DIAGNOSIS — R509 Fever, unspecified: Secondary | ICD-10-CM

## 2015-03-24 DIAGNOSIS — R609 Edema, unspecified: Secondary | ICD-10-CM

## 2015-03-24 LAB — COMPREHENSIVE METABOLIC PANEL (CC13)
ALT: 244 U/L — AB (ref 0–55)
AST: 405 U/L (ref 5–34)
Albumin: 2.6 g/dL — ABNORMAL LOW (ref 3.5–5.0)
Alkaline Phosphatase: 278 U/L — ABNORMAL HIGH (ref 40–150)
Anion Gap: 5 mEq/L (ref 3–11)
BUN: 6.2 mg/dL — ABNORMAL LOW (ref 7.0–26.0)
CO2: 27 meq/L (ref 22–29)
Calcium: 9.4 mg/dL (ref 8.4–10.4)
Chloride: 102 mEq/L (ref 98–109)
Creatinine: 0.6 mg/dL (ref 0.6–1.1)
EGFR: >90 mL/min/{1.73_m2} (ref >90)
Glucose: 140 mg/dl (ref 70–140)
Potassium: 3.7 mEq/L (ref 3.5–5.1)
SODIUM: 134 meq/L — AB (ref 136–145)
TOTAL PROTEIN: 6.4 g/dL (ref 6.4–8.3)
Total Bilirubin: 0.69 mg/dL (ref 0.20–1.20)

## 2015-03-24 LAB — CBC & DIFF AND RETIC
BASO%: 0.2 % (ref 0.0–2.0)
BASOS ABS: 0 10*3/uL (ref 0.0–0.1)
EOS%: 3 % (ref 0.0–7.0)
Eosinophils Absolute: 0.2 10*3/uL (ref 0.0–0.5)
HEMATOCRIT: 27.8 % — AB (ref 34.8–46.6)
HEMOGLOBIN: 9.7 g/dL — AB (ref 11.6–15.9)
IMMATURE RETIC FRACT: 24.2 % — AB (ref 1.60–10.00)
LYMPH#: 0.5 10*3/uL — AB (ref 0.9–3.3)
LYMPH%: 9.7 % — ABNORMAL LOW (ref 14.0–49.7)
MCH: 32.2 pg (ref 25.1–34.0)
MCHC: 34.9 g/dL (ref 31.5–36.0)
MCV: 92.4 fL (ref 79.5–101.0)
MONO#: 0.9 10*3/uL (ref 0.1–0.9)
MONO%: 16.6 % — ABNORMAL HIGH (ref 0.0–14.0)
NEUT#: 3.8 10*3/uL (ref 1.5–6.5)
NEUT%: 70.5 % (ref 38.4–76.8)
PLATELETS: 125 10*3/uL — AB (ref 145–400)
RBC: 3.01 10*6/uL — ABNORMAL LOW (ref 3.70–5.45)
RDW: 14.6 % — ABNORMAL HIGH (ref 11.2–14.5)
Retic %: 0.83 % (ref 0.70–2.10)
Retic Ct Abs: 24.98 10*3/uL — ABNORMAL LOW (ref 33.70–90.70)
WBC: 5.4 10*3/uL (ref 3.9–10.3)

## 2015-03-24 LAB — LIPID PANEL
Cholesterol: 134 mg/dL (ref 125–200)
HDL: 33 mg/dL — ABNORMAL LOW (ref >=46)
LDL Cholesterol: 85 mg/dL (ref <130)
Total CHOL/HDL Ratio: 4.1 Ratio (ref <=5.0)
Triglycerides: 79 mg/dL (ref <150)
VLDL: 16 mg/dL (ref <30)

## 2015-03-24 LAB — CULTURE, BLOOD (ROUTINE X 2)
Culture: NO GROWTH
Culture: NO GROWTH

## 2015-03-24 LAB — CANCER ANTIGEN 19-9: CA 19-9: 15152.1 U/mL — ABNORMAL HIGH (ref <35.0)

## 2015-03-24 MED ORDER — LORAZEPAM 0.5 MG PO TABS: 0.5000 mg | ORAL_TABLET | Freq: Three times a day (TID) | ORAL | Status: DC

## 2015-03-24 NOTE — Telephone Encounter (Signed)
per pof tos ch pt appt-gave pt copy of avs-scan sch by [facility] chemo and Nutrition appt today

## 2015-03-24 NOTE — Telephone Encounter (Signed)
Patient arrived to lab said that Dr. Rosalyn Gess wanted her to have a cholesterol level.  Lipid profile ordered

## 2015-03-25 ENCOUNTER — Other Ambulatory Visit: Payer: Self-pay | Admitting: *Deleted

## 2015-03-25 ENCOUNTER — Ambulatory Visit (HOSPITAL_COMMUNITY)
Admission: RE | Admit: 2015-03-25 | Discharge: 2015-03-25 | Disposition: A | Discharge status: Home / Self Care | Payer: Medicare PPO | Source: Ambulatory Visit | Attending: Hematology | Admitting: Hematology

## 2015-03-25 DIAGNOSIS — M7989 Other specified soft tissue disorders: Secondary | ICD-10-CM | POA: Diagnosis not present

## 2015-03-25 DIAGNOSIS — R609 Edema, unspecified: Secondary | ICD-10-CM

## 2015-03-25 NOTE — Progress Notes (Signed)
[facility] was contacted for a palliative visit and assessment referral. Informed that no document was needed and gave personnel a verbal order and information about patient. Personnel informed RN that Billey Chang NP will be contacting patient for follow up.

## 2015-03-25 NOTE — Progress Notes (Signed)
[facility] was contacted for a palliative visit and assessment referral. Informed that no document was needed and gave personnel a verbal order and information about patient. Personnel informed RN that Billey Chang NP will be contacting patient for follow up.

## 2015-03-25 NOTE — Progress Notes (Signed)
Bilateral lower extremity venous duplex completed:  No evidence of DVT, superficial thrombosis, or Baker's cyst.   

## 2015-03-25 NOTE — Progress Notes (Signed)
[name]  HEMATOLOGY ONCOLOGY PROGRESS NOTE  Date of service 03/24/2015  Patient Care Team: Shirline Frees, MD as PCP - General (Family Medicine) Brunetta Genera, MD as Consulting Physician (Hematology)  SUMMARY OF ONCOLOGIC HISTORY:   Pancreatic carcinoma metastatic to liver   02/15/2015 Imaging CT Abd IMPRESSION: 1. Widespread hepatic metastatic disease. No underlying morphologic changes of cirrhosis are demonstrated to suggest multifocal hepatocellular carcinoma. 2. No primary malignancy identified within the abdomen. The pelvis was not imaged.    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 Widespread neoplastic disease throughout the liver and lymph nodes of the upper abdomen. 2. No definite extrahepatic primary malignancy identified. 3. Potential early nodal metastases within the right internal mammary, subcarinal and left hilar station   03/07/2015 Initial Biopsy Preliminary results of liver lesion biopsy as per discussion with pathology Dr Donato Heinz consistent with metastatic pancreatic carcinoma. Final results pending additional IHC stains.   03/10/2015 -  Chemotherapy Started on Gemcitabine +  Abraxane chemotherapy cycle 1   03/17/2015 -  Chemotherapy Cycle 1 day 8. Abraxane held due to elevated LFTs likely related to excessive acetaminophen intake. Received gemcitabine.    INTERVAL HISTORY:  Ms. Marquia Costello is here for follow-up prior to her cycle 1 day 15 chemotherapy for metastatic pancreas cancer.  Was seen in the emergency room over the weekend for some fevers. Received 3 L of IV fluids and developed some bilateral leg swelling. Her LFTs were noted to be still elevated and she noted significant nausea and weakness. Her day 15 gemcitabine/ Abraxane was held to allow for improvement in liver functions. She was given a prescription for ciprofloxacin and Augmentin as backup in case she had developed  high-grade fevers. She started taking the antibiotics for dose or 2 with temperatures on in the 99's and she was recommended to stop it at this time. No further fevers today. Uncontrolled abdominal pains. Some mild nausea and some element of anxiety and difficulty sleeping. She was given when necessary Ativan. Asked to use her oxycodone at nighttime to help with controlling her cough and pain.   REVIEW OF SYSTEMS:   10 point review of systems was done and is negative except as noted above.  I have reviewed the past medical history, past surgical history, social history and family history with the patient and they are unchanged from previous note.  ALLERGIES:  is allergic to boniva; delsym; erythromycin; fosamax; glucosamine forte; guaifenesin & derivatives; lactose intolerance (gi); nexium; tegretol; and zostavax.  MEDICATIONS:  Current Outpatient Prescriptions  Medication Sig Dispense Refill  . ibuprofen (ADVIL,MOTRIN) 200 MG tablet Take 200 mg by mouth every 6 (six) hours as needed.    . AMBULATORY NON FORMULARY MEDICATION Take 90 mg by mouth 2 (two) times daily. Medication Name: Brilinta 90 mg BID provided by [employer] Research study (Do Not Fill)    . AMBULATORY NON FORMULARY MEDICATION Take 81 mg by mouth daily. Medication Name: ASA 81 mg daily Provided by [employer] study    . amoxicillin-clavulanate (AUGMENTIN) 875-125 MG per tablet Take 1 tablet by mouth 2 (two) times daily. 20 tablet 0  . atorvastatin (LIPITOR) 80 MG tablet Take 1 tablet (80 mg total) by mouth daily at 6 PM. 30 tablet 11  . Azelaic Acid 15 % cream Apply 1 application topically daily as needed (for acne and rosacea).     . ciprofloxacin (CIPRO) 500 MG tablet Take  1 tablet (500 mg total) by mouth 2 (two) times daily. 20 tablet 0  . dexamethasone (DECADRON) 2 MG tablet Take 1 tablet (2 mg total) by mouth 2 (two) times daily with a meal. (Patient taking differently: Take 2 mg by mouth 2 (two) times daily with a meal. Take for 3  days after chemo treatment.) 30 tablet 0  . feeding supplement (BOOST / RESOURCE BREEZE) LIQD Take 1 Container by mouth daily.  0  . fluticasone (FLONASE) 50 MCG/ACT nasal spray Place 1 spray into both nostrils daily as needed for allergies.     [name] lactase (LACTAID) 3000 UNITS tablet Take 1 tablet by mouth daily as needed (for lactose intolerance).    . lansoprazole (PREVACID) 30 MG capsule Take 1 capsule (30 mg total) by mouth daily at 12 noon. 30 capsule 0  . lipase/protease/amylase (CREON) 12000 UNITS CPEP capsule Take 1 capsule (12,000 Units total) by mouth 3 (three) times daily before meals. 270 capsule 1  . LORazepam (ATIVAN) 0.5 MG tablet Take 1-2 tablets (0.5-1 mg total) by mouth every 8 (eight) hours. 50 tablet 0  . losartan (COZAAR) 25 MG tablet Take 1 tablet (25 mg total) by mouth daily. 30 tablet 6  . metoprolol tartrate (LOPRESSOR) 25 MG tablet Take 0.5 tablets (12.5 mg total) by mouth 2 (two) times daily. 60 tablet 4  . Multiple Vitamins-Minerals (MULTIVITAMIN & MINERAL PO) Take 1 tablet by mouth at bedtime.     . ondansetron (ZOFRAN) 8 MG tablet Take 1 tablet (8 mg total) by mouth every 8 (eight) hours as needed for nausea or vomiting. 30 tablet 3  . oxyCODONE (OXY IR/ROXICODONE) 5 MG immediate release tablet Take 1 tablet (5 mg total) by mouth every 4 (four) hours as needed for severe pain. (Patient taking differently: Take 5 mg by mouth every 4 (four) hours as needed for severe pain (for cough). ) 60 tablet 0  . PRESCRIPTION MEDICATION Chemo [facility]    . prochlorperazine (COMPAZINE) 10 MG tablet Take 1 tablet (10 mg total) by mouth every 8 (eight) hours as needed for refractory nausea / vomiting (if nausea not responding to zofran). 30 tablet 0  . senna-docusate (SENNA S) 8.6-50 MG per tablet Take 2 tablets by mouth at bedtime. May increase to 2 tab twice daily if constipation persistent 60 tablet 1  . TEGRETOL-XR 200 MG 12 hr tablet Take 1 tablet (200 mg total) by mouth 2 (two) times  daily. 60 tablet 2  . zaleplon (SONATA) 5 MG capsule Take 5 mg by mouth at bedtime as needed for sleep.     No current facility-administered medications for this visit.    PHYSICAL EXAMINATION: ECOG PERFORMANCE STATUS: 1 - Symptomatic but completely ambulatory  Filed Vitals:   03/24/15 1007  BP: 146/63  Pulse: 99  Temp: 98.6 F (37 C)  Resp: 18   Filed Weights   03/24/15 1007  Weight: 115 lb 14.4 oz (52.572 kg)    GENERAL:alert, no distress and comfortable SKIN: skin color, texture, turgor are normal, no rashes or significant lesions EYES: normal, Conjunctiva are pink and non-injected, sclera clear OROPHARYNX:no exudate, no erythema and lips, buccal mucosa, and tongue normal  NECK: supple, thyroid normal size, non-tender, without nodularity LYMPH:  no palpable lymphadenopathy in the cervical, axillary or inguinal LUNGS: clear to auscultation and percussion with normal breathing effort HEART: regular rate & rhythm and no murmurs and no lower extremity edema ABDOMEN:abdomen soft, non-tender and normal bowel sounds Musculoskeletal:no cyanosis of digits and  no clubbing  NEURO: alert & oriented x 3 with fluent speech, no focal motor/sensory deficits  LABORATORY DATA:    . CBC Latest Ref Rng 03/24/2015 03/19/2015 03/17/2015  WBC 3.9 - 10.3 10e3/uL 5.4 8.2 7.1  Hemoglobin 11.6 - 15.9 g/dL 9.7(L) 10.5(L) 9.9(L)  Hematocrit 34.8 - 46.6 % 27.8(L) 29.8(L) 28.8(L)  Platelets 145 - 400 10e3/uL 125(L) 195 158     CMP Latest Ref Rng 03/24/2015 03/19/2015 03/17/2015  Glucose 70 - 140 mg/dl 140 95 110  BUN 7.0 - 26.0 mg/dL 6.2(L) 12 8.7  Creatinine 0.6 - 1.1 mg/dL 0.6 0.62 0.6  Sodium 136 - 145 mEq/L 134(L) 128(L) 132(L)  Potassium 3.5 - 5.1 mEq/L 3.7 4.6 4.3  Chloride 101 - 111 mmol/L - 100(L) -  CO2 22 - 29 mEq/L 27 24 22   Calcium 8.4 - 10.4 mg/dL 9.4 8.8(L) 8.9  Total Protein 6.4 - 8.3 g/dL 6.4 7.1 6.7  Total Bilirubin 0.20 - 1.20 mg/dL 0.69 1.2 0.58  Alkaline Phos 40 - 150 U/L 278(H)  308(H) 339(H)  AST 5 - 34 U/L 405 Repeated and Verified(HH) 260(H) 204(HH)  ALT 0 - 55 U/L 244(H) 184(H) 186(H)    Lab Results  Component Value Date   WBC 5.4 03/24/2015   NEUTROABS 3.8 03/24/2015   HGB 9.7* 03/24/2015   HCT 27.8* 03/24/2015   MCV 92.4 03/24/2015   PLT 125* 03/24/2015     RADIOGRAPHIC STUDIES: Ultrasound of bilateral lower extremities done 03/25/2015: no evidence of DVTs.  ASSESSMENT & PLAN:   1) Pancreatic carcinoma metastatic to the liver.  Patient has significant liver involvement with metastases involving perhaps 50 percent or more of her liver. ECOG performance status 1 Received full dose gemcitabine and Abraxane cycle 1 day 1 and only gemcitabine on day 8. Day 15 dose was held due to elevated liver functions. AST about 10 times upper limit of normal normal bilirubin level. Patient having some nausea, poor appetite and fatigue about grade 1-2. Plan -Held cycle 1 day 15 chemotherapy to allow for improvement in liver functions. -Given Ativan prescription for when necessary use for nausea, anxiety and insomnia. -I will reschedule her for chemotherapy on 04/01/2015 and change her gemcitabine and Abraxane dosing to every other week to allow for better tolerability as per data presented at [place] in 2015which showed similar efficacy and improved tolerability. -Palliative care services have been consulted and she'll be seeing the patient on Monday to help with any home services and other needs she might have. -I had a detailed discussion with the patient regarding her current condition and the palliative goal of therapy and the importance of trying to better and staying physically active. -I called her again on 03/25/2015 and she noted that she was feeling more comfortable and had a good night sleep. -follow up in clinic on 03/31/2015 with labs  2)Elevated liver function tests - Could be from gemcitabine or Abraxane or liver inflammation from tumor  breakdown. Plan -Hold chemotherapy day 15 today -we will reduce gemcitabine and Abraxane dosing for next cycle and do chemotherapy every other week. That is will do want a day 1 and day 15 every cycle of 28 days to improve tolerance.  3) Nausea and vomiting -given when necessary Ativan prescription -Continue PPI, when necessary Zofran -Might restart dexamethasone 2 mg daily if needed.  4)Protein calorie malnutrition -has been working with nutritional therapy -Continue lactase and pancreatic enzyme supplements to help avoid significant dyspeptic symptoms.  Pain management -Continue oxycodone when  necessary. Patient not needing scheduled long-acting medications at this time but will closely monitor to optimize symptom control.  Constipation  -Continue senna S as needed along with opiates. Hold if diarrhea ensues.  All questions were answered. The patient knows to call the clinic with any problems, questions or concerns.  I spent 25 minutes counseling the patient face to face. The total time spent in the appointment was 35 minutes and more than 50% was on counseling and review of test results   Sullivan Lone MD [place]  (Office):       [phone] (Work cell):  [phone] (Fax):           [phone]

## 2015-03-28 ENCOUNTER — Telehealth: Payer: Self-pay | Admitting: *Deleted

## 2015-03-28 NOTE — Telephone Encounter (Signed)
Advised patient, verbalized understanding   Notes Recorded by [name], MD on 03/27/2015 at 9:34 PM No stop statin for now Notes Recorded by [name], PA-C on 03/27/2015 at 1:24 PM Pt has liver cancer. See LFTS. ? Continue statin Rx.

## 2015-03-28 NOTE — Telephone Encounter (Signed)
-----   Message from [name], MD sent at 03/27/2015  9:34 PM EDT ----- No stop statin for now

## 2015-03-29 ENCOUNTER — Telehealth: Payer: Self-pay | Admitting: *Deleted

## 2015-03-29 NOTE — Telephone Encounter (Signed)
SENT TO DR.[place], [place] Faster.

## 2015-03-29 NOTE — Telephone Encounter (Signed)
Pt called to say 0.5 mg ativan was "too sedating."  Pt reported having difficultly getting up to go to the bathroom overnight.  Verified with patient that she is only taking 0.5mg  and not 1 mg ativan.  Pt taking 0.5mg  only.  Also verified that patient is not taking at same time as oxycodone, pt states she is not taking oxycodone currently.  Informed patient to try cutting 0.5mg  ativan in half (0.25mg ).  Pt states she will try that and inform us if it is still too much.

## 2015-03-29 NOTE — Telephone Encounter (Signed)
MARY TEAGUE RN HANDLE CALL ON 03/24/15 LAB ORDERED

## 2015-03-30 ENCOUNTER — Other Ambulatory Visit: Payer: Self-pay | Admitting: Hematology

## 2015-03-30 DIAGNOSIS — C259 Malignant neoplasm of pancreas, unspecified: Secondary | ICD-10-CM

## 2015-03-30 DIAGNOSIS — C787 Secondary malignant neoplasm of liver and intrahepatic bile duct: Secondary | ICD-10-CM

## 2015-03-30 DIAGNOSIS — R5381 Other malaise: Secondary | ICD-10-CM

## 2015-03-30 NOTE — Progress Notes (Unsigned)
[facility] and shower chair for home.  Lacretia to set up with patient.

## 2015-03-31 ENCOUNTER — Telehealth: Payer: Self-pay | Admitting: Hematology

## 2015-03-31 ENCOUNTER — Encounter: Payer: Self-pay | Admitting: Hematology

## 2015-03-31 ENCOUNTER — Other Ambulatory Visit (HOSPITAL_BASED_OUTPATIENT_CLINIC_OR_DEPARTMENT_OTHER): Payer: Medicare PPO

## 2015-03-31 ENCOUNTER — Other Ambulatory Visit: Payer: Self-pay | Admitting: Hematology

## 2015-03-31 ENCOUNTER — Ambulatory Visit (HOSPITAL_BASED_OUTPATIENT_CLINIC_OR_DEPARTMENT_OTHER): Payer: Medicare PPO | Admitting: Hematology

## 2015-03-31 VITALS — BP 153/72 | HR 78 | Temp 97.6°F | Resp 18 | Ht 63.0 in | Wt 115.5 lb

## 2015-03-31 DIAGNOSIS — R945 Abnormal results of liver function studies: Secondary | ICD-10-CM | POA: Diagnosis not present

## 2015-03-31 DIAGNOSIS — C259 Malignant neoplasm of pancreas, unspecified: Secondary | ICD-10-CM

## 2015-03-31 DIAGNOSIS — C772 Secondary and unspecified malignant neoplasm of intra-abdominal lymph nodes: Secondary | ICD-10-CM

## 2015-03-31 DIAGNOSIS — E46 Unspecified protein-calorie malnutrition: Secondary | ICD-10-CM

## 2015-03-31 DIAGNOSIS — C787 Secondary malignant neoplasm of liver and intrahepatic bile duct: Secondary | ICD-10-CM

## 2015-03-31 DIAGNOSIS — C771 Secondary and unspecified malignant neoplasm of intrathoracic lymph nodes: Secondary | ICD-10-CM | POA: Diagnosis not present

## 2015-03-31 DIAGNOSIS — R112 Nausea with vomiting, unspecified: Secondary | ICD-10-CM

## 2015-03-31 DIAGNOSIS — K5900 Constipation, unspecified: Secondary | ICD-10-CM

## 2015-03-31 LAB — PHOSPHORUS: PHOSPHORUS: 3.5 mg/dL (ref 2.1–4.3)

## 2015-03-31 LAB — CBC & DIFF AND RETIC
BASO%: 0.4 % (ref 0.0–2.0)
Basophils Absolute: 0 10*3/uL (ref 0.0–0.1)
EOS%: 2.9 % (ref 0.0–7.0)
Eosinophils Absolute: 0.3 10*3/uL (ref 0.0–0.5)
HCT: 31.4 % — ABNORMAL LOW (ref 34.8–46.6)
HGB: 10.7 g/dL — ABNORMAL LOW (ref 11.6–15.9)
Immature Retic Fract: 9.8 % (ref 1.60–10.00)
LYMPH#: 0.6 10*3/uL — AB (ref 0.9–3.3)
LYMPH%: 6.4 % — AB (ref 14.0–49.7)
MCH: 32.7 pg (ref 25.1–34.0)
MCHC: 34.1 g/dL (ref 31.5–36.0)
MCV: 96 fL (ref 79.5–101.0)
MONO#: 1.1 10*3/uL — ABNORMAL HIGH (ref 0.1–0.9)
MONO%: 10.4 % (ref 0.0–14.0)
NEUT#: 8 10*3/uL — ABNORMAL HIGH (ref 1.5–6.5)
NEUT%: 79.9 % — AB (ref 38.4–76.8)
PLATELETS: 476 10*3/uL — AB (ref 145–400)
RBC: 3.27 10*6/uL — AB (ref 3.70–5.45)
RDW: 18.4 % — ABNORMAL HIGH (ref 11.2–14.5)
Retic %: 4.74 % — ABNORMAL HIGH (ref 0.70–2.10)
Retic Ct Abs: 155 10*3/uL — ABNORMAL HIGH (ref 33.70–90.70)
WBC: 10.1 10*3/uL (ref 3.9–10.3)

## 2015-03-31 LAB — COMPREHENSIVE METABOLIC PANEL (CC13)
ALT: 273 U/L — ABNORMAL HIGH (ref 0–55)
ANION GAP: 7 meq/L (ref 3–11)
AST: 359 U/L (ref 5–34)
Albumin: 2.8 g/dL — ABNORMAL LOW (ref 3.5–5.0)
Alkaline Phosphatase: 296 U/L — ABNORMAL HIGH (ref 40–150)
BUN: 8.6 mg/dL (ref 7.0–26.0)
CHLORIDE: 101 meq/L (ref 98–109)
CO2: 27 mEq/L (ref 22–29)
Calcium: 9.6 mg/dL (ref 8.4–10.4)
Creatinine: 0.6 mg/dL (ref 0.6–1.1)
Glucose: 121 mg/dl (ref 70–140)
Potassium: 3.6 mEq/L (ref 3.5–5.1)
Sodium: 135 mEq/L — ABNORMAL LOW (ref 136–145)
Total Bilirubin: 0.96 mg/dL (ref 0.20–1.20)
Total Protein: 6.6 g/dL (ref 6.4–8.3)

## 2015-03-31 LAB — MAGNESIUM (CC13): Magnesium: 1.8 mg/dl (ref 1.5–2.5)

## 2015-03-31 NOTE — Telephone Encounter (Signed)
Appointments made and avs printed for patient,per dr Lucille Passy patient does not have to do labs weekly as patients husband states he thought that was what was discussed.  Husband felt "tricked" about chemo not being scheduled for today but now has a new schedule/avs with chemo in place     anne

## 2015-03-31 NOTE — Progress Notes (Signed)
Per Dr. Irene Limbo, ok to treat on 8/19 with elevated liver enzymes as this is likely due to her metastatic disease.

## 2015-04-01 ENCOUNTER — Ambulatory Visit: Payer: Medicare PPO | Admitting: Nutrition

## 2015-04-01 ENCOUNTER — Telehealth: Payer: Self-pay | Admitting: Hematology

## 2015-04-01 ENCOUNTER — Encounter: Payer: Self-pay | Admitting: Hematology

## 2015-04-01 ENCOUNTER — Ambulatory Visit (HOSPITAL_BASED_OUTPATIENT_CLINIC_OR_DEPARTMENT_OTHER): Payer: Medicare PPO

## 2015-04-01 VITALS — BP 144/63 | HR 75 | Temp 98.4°F | Resp 16

## 2015-04-01 DIAGNOSIS — Z5111 Encounter for antineoplastic chemotherapy: Secondary | ICD-10-CM | POA: Diagnosis not present

## 2015-04-01 DIAGNOSIS — C787 Secondary malignant neoplasm of liver and intrahepatic bile duct: Secondary | ICD-10-CM | POA: Diagnosis not present

## 2015-04-01 DIAGNOSIS — C259 Malignant neoplasm of pancreas, unspecified: Secondary | ICD-10-CM

## 2015-04-01 MED ORDER — PACLITAXEL PROTEIN-BOUND CHEMO INJECTION 100 MG
100.0000 mg/m2 | Freq: Once | INTRAVENOUS | Status: AC
  Administered 2015-04-01: 150 mg via INTRAVENOUS
  Filled 2015-04-01: qty 30

## 2015-04-01 MED ORDER — SODIUM CHLORIDE 0.9 % IV SOLN
Freq: Once | INTRAVENOUS | Status: AC
  Administered 2015-04-01: 13:00:00 via INTRAVENOUS

## 2015-04-01 MED ORDER — GEMCITABINE HCL CHEMO INJECTION 1 GM/26.3ML
800.0000 mg/m2 | Freq: Once | INTRAVENOUS | Status: AC
  Administered 2015-04-01: 1216 mg via INTRAVENOUS
  Filled 2015-04-01: qty 32

## 2015-04-01 MED ORDER — HEPARIN SOD (PORK) LOCK FLUSH 100 UNIT/ML IV SOLN
500.0000 [IU] | Freq: Once | INTRAVENOUS | Status: AC | PRN
  Administered 2015-04-01: 500 [IU]
  Filled 2015-04-01: qty 5

## 2015-04-01 MED ORDER — SODIUM CHLORIDE 0.9 % IJ SOLN
10.0000 mL | INTRAMUSCULAR | Status: DC | PRN
  Administered 2015-04-01: 10 mL
  Filled 2015-04-01: qty 10

## 2015-04-01 MED ORDER — SODIUM CHLORIDE 0.9 % IV SOLN
Freq: Once | INTRAVENOUS | Status: AC
  Administered 2015-04-01: 13:00:00 via INTRAVENOUS
  Filled 2015-04-01: qty 5

## 2015-04-01 NOTE — Progress Notes (Signed)
Nutrition follow up completed with patient during infusion. Weight increased slightly to 115.5 pounds on August 18 from 113.9 pounds on August 4.  Patient states she was able to eat better patient had a week off from chemotherapy. Patient has not tolerated Lact-Aid free milk or ice cream. Patient continues Creon and lactase per MD.  Patient reports some loose stools last night after taking Senokot.  Nutrition Diagnosis:  Unintended weight loss improved.  Intervention: Encouraged patient to continue to high calorie, high protein foods in small, frequent meals and snacks.  Agree with patient avoiding low lactose foods if not tolerated well  Teach back method used.  Monitoring, Evaluation, Goals: Patient will continue to work to increase calories and protein to improve weight.  Next Visit: Thursday, September 29 during infusion.  **Disclaimer: This note was dictated with voice recognition software. Similar sounding words can inadvertently be transcribed and this note may contain transcription errors which may not have been corrected upon publication of note.**

## 2015-04-01 NOTE — Patient Instructions (Signed)
[facility] Discharge Instructions for Patients Receiving Chemotherapy  Today you received the following chemotherapy agents: Gemzar, abraxane  To help prevent nausea and vomiting after your treatment, we encourage you to take your nausea medication as prescribed by your physician.   If you develop nausea and vomiting that is not controlled by your nausea medication, call the clinic.   BELOW ARE SYMPTOMS THAT SHOULD BE REPORTED IMMEDIATELY:  *FEVER GREATER THAN 100.5 F  *CHILLS WITH OR WITHOUT FEVER  NAUSEA AND VOMITING THAT IS NOT CONTROLLED WITH YOUR NAUSEA MEDICATION  *UNUSUAL SHORTNESS OF BREATH  *UNUSUAL BRUISING OR BLEEDING  TENDERNESS IN MOUTH AND THROAT WITH OR WITHOUT PRESENCE OF ULCERS  *URINARY PROBLEMS  *BOWEL PROBLEMS  UNUSUAL RASH Items with * indicate a potential emergency and should be followed up as soon as possible.  Feel free to call the clinic you have any questions or concerns. The clinic phone number is [phone].  Please show the [facility] at check-in to the Emergency Department and triage nurse.

## 2015-04-01 NOTE — Telephone Encounter (Signed)
per pof to sch pt appt-cld & spoke to pt and gave pt appt time & appt

## 2015-04-01 NOTE — Progress Notes (Signed)
Checked in pt for appointment. Pt signed AOB. Pt requests to be billed for copay.

## 2015-04-04 ENCOUNTER — Telehealth: Payer: Self-pay | Admitting: *Deleted

## 2015-04-04 NOTE — Telephone Encounter (Signed)
I have adjusted 9/1 appt 

## 2015-04-04 NOTE — Progress Notes (Signed)
[name]  HEMATOLOGY ONCOLOGY PROGRESS NOTE  Date of service 03/31/2015  Patient Care Team: Shirline Frees, MD as PCP - General (Family Medicine) Brunetta Genera, MD as Consulting Physician (Hematology)  SUMMARY OF ONCOLOGIC HISTORY:   Pancreatic carcinoma metastatic to liver   02/15/2015 Imaging CT Abd IMPRESSION: 1. Widespread hepatic metastatic disease. No underlying morphologic changes of cirrhosis are demonstrated to suggest multifocal hepatocellular carcinoma. 2. No primary malignancy identified within the abdomen. The pelvis was not imaged.    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 Widespread neoplastic disease throughout the liver and lymph nodes of the upper abdomen. 2. No definite extrahepatic primary malignancy identified. 3. Potential early nodal metastases within the right internal mammary, subcarinal and left hilar station   03/07/2015 Initial Biopsy Preliminary results of liver lesion biopsy as per discussion with pathology Dr Donato Heinz consistent with metastatic pancreatic carcinoma. Final results pending additional IHC stains.   03/10/2015 -  Chemotherapy Started on Gemcitabine +  Abraxane chemotherapy cycle 1   03/17/2015 -  Chemotherapy Cycle 1 day 8. Abraxane held due to elevated LFTs likely related to excessive acetaminophen intake. Received gemcitabine.    INTERVAL HISTORY:  [name] is here for follow-up prior to her cycle 2 day 1 chemotherapy for metastatic pancreas cancer.  Notes that her last week has been relatively better. No fevers or chills. Better appetite. Bowel habits were regular. Has noticed significant hair loss and therefore decided to have her hair shaved and is using a At this time. Notes that she spent some good time with her grandchildren. Still having some cough on and off. No uncontrolled pain. Mild nausea controlled with Zofran. Leg swelling is better. We  discussed the fact that her LFTs were still elevated and that it is difficult to know if this is from her cancer affecting the liver or from chemotherapy with the fact that the elevation was persistent and her CA-19-9 levels were higher might suggest it is related to her cancer. She was appreciative of the palliative care input provided through the hospice agency.  After extensive counseling regarding further treatment versus best supportive care's the patient and her husband choose to proceed with dose reduced gemcitabine and Abraxane.  Her husband is understandably upset with the rapidity of her decline and the fact that she is declining with her metastatic pancreatic cancer. He appears to be having some understandable difficulty with adjustment to this. He has been taking very good care of her and has been supporting her to stay physically active. He notes that he is hoping that she can make it through to this Christmas. I mentioned that I would love for that to happen but her overall condition remains quite concerning.  REVIEW OF SYSTEMS:   10 point review of systems was done and is negative except as noted above.  I have reviewed the past medical history, past surgical history, social history and family history with the patient and they are unchanged from previous note.  ALLERGIES:  is allergic to boniva; delsym; erythromycin; fosamax; glucosamine forte; guaifenesin & derivatives; lactose intolerance (gi); nexium; tegretol; and zostavax.  MEDICATIONS:  Current Outpatient Prescriptions  Medication Sig Dispense Refill  . AMBULATORY NON FORMULARY MEDICATION Take 90 mg by mouth 2 (two) times daily. Medication Name: Brilinta 90 mg BID provided by [employer] Research study (Do Not Fill)    . AMBULATORY NON FORMULARY MEDICATION Take 81  mg by mouth daily. Medication Name: ASA 81 mg daily Provided by [employer] study    . amoxicillin-clavulanate (AUGMENTIN) 875-125 MG per tablet Take 1 tablet by mouth 2  (two) times daily. 20 tablet 0  . Azelaic Acid 15 % cream Apply 1 application topically daily as needed (for acne and rosacea).     . ciprofloxacin (CIPRO) 500 MG tablet Take 1 tablet (500 mg total) by mouth 2 (two) times daily. 20 tablet 0  . dexamethasone (DECADRON) 2 MG tablet Take 1 tablet (2 mg total) by mouth 2 (two) times daily with a meal. (Patient taking differently: Take 2 mg by mouth 2 (two) times daily with a meal. Take for 3 days after chemo treatment.) 30 tablet 0  . feeding supplement (BOOST / RESOURCE BREEZE) LIQD Take 1 Container by mouth daily.  0  . fluticasone (FLONASE) 50 MCG/ACT nasal spray Place 1 spray into both nostrils daily as needed for allergies.     [name] ibuprofen (ADVIL,MOTRIN) 200 MG tablet Take 200 mg by mouth every 6 (six) hours as needed.    . lactase (LACTAID) 3000 UNITS tablet Take 1 tablet by mouth daily as needed (for lactose intolerance).    . lansoprazole (PREVACID) 30 MG capsule Take 1 capsule (30 mg total) by mouth daily at 12 noon. 30 capsule 0  . LORazepam (ATIVAN) 0.5 MG tablet Take 1-2 tablets (0.5-1 mg total) by mouth every 8 (eight) hours. (Patient taking differently: Take 0.25 mg by mouth every 8 (eight) hours. ) 50 tablet 0  . losartan (COZAAR) 25 MG tablet Take 1 tablet (25 mg total) by mouth daily. 30 tablet 6  . metoprolol tartrate (LOPRESSOR) 25 MG tablet Take 0.5 tablets (12.5 mg total) by mouth 2 (two) times daily. 60 tablet 4  . ondansetron (ZOFRAN) 8 MG tablet Take 1 tablet (8 mg total) by mouth every 8 (eight) hours as needed for nausea or vomiting. 30 tablet 3  . oxyCODONE (OXY IR/ROXICODONE) 5 MG immediate release tablet Take 1 tablet (5 mg total) by mouth every 4 (four) hours as needed for severe pain. (Patient taking differently: Take 5 mg by mouth every 4 (four) hours as needed for severe pain (for cough). ) 60 tablet 0  . PRESCRIPTION MEDICATION Chemo [facility]    . prochlorperazine (COMPAZINE) 10 MG tablet Take 1 tablet (10 mg total) by mouth  every 8 (eight) hours as needed for refractory nausea / vomiting (if nausea not responding to zofran). 30 tablet 0  . senna-docusate (SENNA S) 8.6-50 MG per tablet Take 2 tablets by mouth at bedtime. May increase to 2 tab twice daily if constipation persistent 60 tablet 1  . TEGRETOL-XR 200 MG 12 hr tablet Take 1 tablet (200 mg total) by mouth 2 (two) times daily. 60 tablet 2  . atorvastatin (LIPITOR) 80 MG tablet Take 1 tablet (80 mg total) by mouth daily at 6 PM. (Patient not taking: Reported on 03/31/2015) 30 tablet 11  . Multiple Vitamins-Minerals (MULTIVITAMIN & MINERAL PO) Take 1 tablet by mouth at bedtime.     . zaleplon (SONATA) 5 MG capsule Take 5 mg by mouth at bedtime as needed for sleep.     No current facility-administered medications for this visit.    PHYSICAL EXAMINATION: ECOG PERFORMANCE STATUS: 1 - Symptomatic but completely ambulatory  Filed Vitals:   03/31/15 0959  BP: 153/72  Pulse: 78  Temp: 97.6 F (36.4 C)  Resp: 18   Filed Weights   03/31/15 0959  Weight:  115 lb 8 oz (52.39 kg)   GENERAL: Alert, no distress and comfortable SKIN: skin color, texture, turgor are normal, no rashes or significant lesions EYES: normal, Conjunctiva are pink and non-injected, sclera clear OROPHARYNX:no exudate, no erythema and lips, buccal mucosa, and tongue normal  NECK: supple, thyroid normal size, non-tender, without nodularity LYMPH:  no palpable lymphadenopathy in the cervical, axillary or inguinal LUNGS: clear to auscultation and percussion with normal breathing effort HEART: regular rate & rhythm and no murmurs and no lower extremity edema ABDOMEN:abdomen soft, non-tender and normal bowel sounds Musculoskeletal:no cyanosis of digits and no clubbing  NEURO: alert & oriented x 3 with fluent speech, no focal motor/sensory deficits  LABORATORY DATA:    . CBC Latest Ref Rng 03/31/2015 03/24/2015 03/19/2015  WBC 3.9 - 10.3 10e3/uL 10.1 5.4 8.2  Hemoglobin 11.6 - 15.9 g/dL  10.7(L) 9.7(L) 10.5(L)  Hematocrit 34.8 - 46.6 % 31.4(L) 27.8(L) 29.8(L)  Platelets 145 - 400 10e3/uL 476(H) 125(L) 195     CMP Latest Ref Rng 03/31/2015 03/24/2015 03/19/2015  Glucose 70 - 140 mg/dl 121 140 95  BUN 7.0 - 26.0 mg/dL 8.6 6.2(L) 12  Creatinine 0.6 - 1.1 mg/dL 0.6 0.6 0.62  Sodium 136 - 145 mEq/L 135(L) 134(L) 128(L)  Potassium 3.5 - 5.1 mEq/L 3.6 3.7 4.6  Chloride 101 - 111 mmol/L - - 100(L)  CO2 22 - 29 mEq/L 27 27 24   Calcium 8.4 - 10.4 mg/dL 9.6 9.4 8.8(L)  Total Protein 6.4 - 8.3 g/dL 6.6 6.4 7.1  Total Bilirubin 0.20 - 1.20 mg/dL 0.96 0.69 1.2  Alkaline Phos 40 - 150 U/L 296(H) 278(H) 308(H)  AST 5 - 34 U/L 359(HH) 405 Repeated and Verified(HH) 260(H)  ALT 0 - 55 U/L 273(H) 244(H) 184(H)    Lab Results  Component Value Date   WBC 10.1 03/31/2015   NEUTROABS 8.0* 03/31/2015   HGB 10.7* 03/31/2015   HCT 31.4* 03/31/2015   MCV 96.0 03/31/2015   PLT 476* 03/31/2015     RADIOGRAPHIC STUDIES: Ultrasound of bilateral lower extremities done 03/25/2015: no evidence of DVTs.  ASSESSMENT & PLAN:   1) Pancreatic carcinoma metastatic to the liver.  Patient has significant liver involvement with metastases involving perhaps 50 percent or more of her liver. ECOG performance status 1 Received full dose gemcitabine and Abraxane cycle 1 day 1 and only gemcitabine on day 8. Day 15 dose was held due to elevated liver functions. AST about 10 times upper limit of normal, normal bilirubin level. Held cycle 1 day 15 chemotherapy to allow for improvement in liver functions.  Patient is feeling better with an additional 1 week of rest period and developed a better. LFTs about the same. Normal bilirubin. This could very well be from her active cancer versus chemotherapy. Plan  -After extensive discussion with the patient and her husband we will continue to proceed with gemcitabine/Abraxane chemotherapy with change in her regimen to every 2 weeks to allow for more time for recovery  between chemotherapy. Dose of gemcitabine was reduced to 800 mg/m and Abraxane 100 mg/m -continue Ativan prescription for when necessary use for nausea, anxiety and insomnia. -Continue home palliative care services -Given a prescription for a walker and bedside commode -follow up in clinic in 2 weeks prior to day 15 cycle 2 of chemotherapy with repeat labs  2)Elevated liver function tests -stable. No drop in transaminases is taking might be likely due to liver metastases. Could also be from gemcitabine or Abraxane or liver inflammation from  tumor breakdown. Plan -After discussing risks and benefits patient and her husband have elected to continue chemotherapy dose reduction.  3) Nausea and vomiting -given when necessary Ativan prescription -Continue PPI, when necessary Zofran -Might restart dexamethasone 2 mg daily if needed.  4)Protein calorie malnutrition -has been working with nutritional therapy -Continue lactase and pancreatic enzyme supplements to help avoid significant dyspeptic symptoms.  Pain management -Continue oxycodone when necessary. Patient not needing scheduled long-acting medications at this time but will closely monitor to optimize symptom control.  Constipation  -Continue senna S as needed along with opiates. Hold if diarrhea ensues.  All questions were answered. The patient knows to call the clinic with any problems, questions or concerns.  I spent 25 minutes counseling the patient face to face. The total time spent in the appointment was 35 minutes and more than 50% was on counseling and review of test results   Sullivan Lone MD [place]  (Office):       [phone] (Work cell):  [phone] (Fax):           [phone]

## 2015-04-05 ENCOUNTER — Telehealth: Payer: Self-pay | Admitting: *Deleted

## 2015-04-05 ENCOUNTER — Ambulatory Visit (HOSPITAL_BASED_OUTPATIENT_CLINIC_OR_DEPARTMENT_OTHER): Payer: Medicare PPO

## 2015-04-05 ENCOUNTER — Encounter: Payer: Self-pay | Admitting: Hematology

## 2015-04-05 ENCOUNTER — Ambulatory Visit (HOSPITAL_BASED_OUTPATIENT_CLINIC_OR_DEPARTMENT_OTHER): Payer: Medicare PPO | Admitting: Hematology

## 2015-04-05 VITALS — BP 135/60 | HR 83 | Temp 97.4°F | Resp 17

## 2015-04-05 VITALS — BP 140/71 | HR 86 | Temp 100.1°F | Resp 18 | Ht 63.0 in | Wt 118.6 lb

## 2015-04-05 DIAGNOSIS — C259 Malignant neoplasm of pancreas, unspecified: Secondary | ICD-10-CM

## 2015-04-05 DIAGNOSIS — E860 Dehydration: Secondary | ICD-10-CM

## 2015-04-05 DIAGNOSIS — D649 Anemia, unspecified: Secondary | ICD-10-CM

## 2015-04-05 DIAGNOSIS — R509 Fever, unspecified: Secondary | ICD-10-CM | POA: Diagnosis not present

## 2015-04-05 DIAGNOSIS — C787 Secondary malignant neoplasm of liver and intrahepatic bile duct: Secondary | ICD-10-CM | POA: Diagnosis not present

## 2015-04-05 DIAGNOSIS — R197 Diarrhea, unspecified: Secondary | ICD-10-CM | POA: Diagnosis not present

## 2015-04-05 DIAGNOSIS — Z452 Encounter for adjustment and management of vascular access device: Secondary | ICD-10-CM

## 2015-04-05 LAB — CBC & DIFF AND RETIC
BASO%: 0.2 % (ref 0.0–2.0)
BASOS ABS: 0 10*3/uL (ref 0.0–0.1)
EOS ABS: 0.1 10*3/uL (ref 0.0–0.5)
EOS%: 1.5 % (ref 0.0–7.0)
HEMATOCRIT: 26.5 % — AB (ref 34.8–46.6)
HEMOGLOBIN: 9.1 g/dL — AB (ref 11.6–15.9)
Immature Retic Fract: 1.2 % — ABNORMAL LOW (ref 1.60–10.00)
LYMPH%: 8.8 % — AB (ref 14.0–49.7)
MCH: 32.9 pg (ref 25.1–34.0)
MCHC: 34.3 g/dL (ref 31.5–36.0)
MCV: 95.7 fL (ref 79.5–101.0)
MONO#: 0.1 10*3/uL (ref 0.1–0.9)
MONO%: 2.4 % (ref 0.0–14.0)
NEUT%: 87.1 % — AB (ref 38.4–76.8)
NEUTROS ABS: 4.6 10*3/uL (ref 1.5–6.5)
PLATELETS: 350 10*3/uL (ref 145–400)
RBC: 2.77 10*6/uL — ABNORMAL LOW (ref 3.70–5.45)
RDW: 18.4 % — ABNORMAL HIGH (ref 11.2–14.5)
Retic %: 1.02 % (ref 0.70–2.10)
Retic Ct Abs: 28.25 10*3/uL — ABNORMAL LOW (ref 33.70–90.70)
WBC: 5.3 10*3/uL (ref 3.9–10.3)
lymph#: 0.5 10*3/uL — ABNORMAL LOW (ref 0.9–3.3)

## 2015-04-05 LAB — HOLD TUBE, BLOOD BANK

## 2015-04-05 MED ORDER — SODIUM CHLORIDE 0.9 % IJ SOLN
10.0000 mL | INTRAMUSCULAR | Status: DC | PRN
  Administered 2015-04-05: 10 mL via INTRAVENOUS
  Filled 2015-04-05: qty 10

## 2015-04-05 MED ORDER — POTASSIUM CHLORIDE 2 MEQ/ML IV SOLN
Freq: Once | INTRAVENOUS | Status: AC
  Administered 2015-04-05: 15:00:00 via INTRAVENOUS
  Filled 2015-04-05: qty 1000

## 2015-04-05 MED ORDER — DEXTROSE-NACL 5-0.9 % IV SOLN: INTRAVENOUS | Status: DC

## 2015-04-05 MED ORDER — HEPARIN SOD (PORK) LOCK FLUSH 100 UNIT/ML IV SOLN
500.0000 [IU] | Freq: Once | INTRAVENOUS | Status: AC
  Administered 2015-04-05: 500 [IU] via INTRAVENOUS
  Filled 2015-04-05: qty 5

## 2015-04-05 NOTE — Telephone Encounter (Signed)
Patient filled out a walk in form with complaint of fever of 102. Brought to Dr. Irene Limbo nurse who advised Dr. Irene Limbo would like to see pt. POF completed, no labs prior.

## 2015-04-05 NOTE — Progress Notes (Signed)
Pt instructed to check temp daily and report fever greater than 100.0. Pt and husband voice understanding

## 2015-04-05 NOTE — Patient Instructions (Signed)
Dehydration, Adult Dehydration is when you lose more fluids from the body than you take in. Vital organs like the kidneys, brain, and heart cannot function without a proper amount of fluids and salt. Any loss of fluids from the body can cause dehydration.  CAUSES   Vomiting.  Diarrhea.  Excessive sweating.  Excessive urine output.  Fever. SYMPTOMS  Mild dehydration  Thirst.  Dry lips.  Slightly dry mouth. Moderate dehydration  Very dry mouth.  Sunken eyes.  Skin does not bounce back quickly when lightly pinched and released.  Dark urine and decreased urine production.  Decreased tear production.  Headache. Severe dehydration  Very dry mouth.  Extreme thirst.  Rapid, weak pulse (more than 100 beats per minute at rest).  Cold hands and feet.  Not able to sweat in spite of heat and temperature.  Rapid breathing.  Blue lips.  Confusion and lethargy.  Difficulty being awakened.  Minimal urine production.  No tears. DIAGNOSIS  Your caregiver will diagnose dehydration based on your symptoms and your exam. Blood and urine tests will help confirm the diagnosis. The diagnostic evaluation should also identify the cause of dehydration. TREATMENT  Treatment of mild or moderate dehydration can often be done at home by increasing the amount of fluids that you drink. It is best to drink small amounts of fluid more often. Drinking too much at one time can make vomiting worse. Refer to the home care instructions below. Severe dehydration needs to be treated at the hospital where you will probably be given intravenous (IV) fluids that contain water and electrolytes. HOME CARE INSTRUCTIONS   Ask your caregiver about specific rehydration instructions.  Drink enough fluids to keep your urine clear or pale yellow.  Drink small amounts frequently if you have nausea and vomiting.  Eat as you normally do.  Avoid:  Foods or drinks high in sugar.  Carbonated  drinks.  Juice.  Extremely hot or cold fluids.  Drinks with caffeine.  Fatty, greasy foods.  Alcohol.  Tobacco.  Overeating.  Gelatin desserts.  Wash your hands well to avoid spreading bacteria and viruses.  Only take over-the-counter or prescription medicines for pain, discomfort, or fever as directed by your caregiver.  Ask your caregiver if you should continue all prescribed and over-the-counter medicines.  Keep all follow-up appointments with your caregiver. SEEK MEDICAL CARE IF:  You have abdominal pain and it increases or stays in one area (localizes).  You have a rash, stiff neck, or severe headache.  You are irritable, sleepy, or difficult to awaken.  You are weak, dizzy, or extremely thirsty. SEEK IMMEDIATE MEDICAL CARE IF:   You are unable to keep fluids down or you get worse despite treatment.  You have frequent episodes of vomiting or diarrhea.  You have blood or green matter (bile) in your vomit.  You have blood in your stool or your stool looks black and tarry.  You have not urinated in 6 to 8 hours, or you have only urinated a small amount of very dark urine.  You have a fever.  You faint. MAKE SURE YOU:   Understand these instructions.  Will watch your condition.  Will get help right away if you are not doing well or get worse. Document Released: 07/30/2005 Document Revised: 10/22/2011 Document Reviewed: 03/19/2011 Exit[employer] Patient Information 2015 [employer]. This information is not intended to replace advice given to you by your health care provider. Make sure you discuss any questions you have with your health care   provider.  

## 2015-04-06 ENCOUNTER — Telehealth: Payer: Self-pay | Admitting: Hematology

## 2015-04-06 ENCOUNTER — Other Ambulatory Visit: Payer: Self-pay | Admitting: *Deleted

## 2015-04-06 ENCOUNTER — Other Ambulatory Visit: Payer: Self-pay | Admitting: Hematology

## 2015-04-06 NOTE — Telephone Encounter (Signed)
per pof to sch pt appt-cld & spoke to pt and adv pt of time & date of appt-pt understood

## 2015-04-07 ENCOUNTER — Other Ambulatory Visit: Payer: Medicare PPO

## 2015-04-07 ENCOUNTER — Other Ambulatory Visit: Payer: Self-pay | Admitting: *Deleted

## 2015-04-07 MED ORDER — LANSOPRAZOLE 30 MG PO CPDR: 30.0000 mg | DELAYED_RELEASE_CAPSULE | Freq: Every day | ORAL | Status: DC

## 2015-04-08 NOTE — Progress Notes (Signed)
[name]  HEMATOLOGY ONCOLOGY PROGRESS NOTE  Date of service 04/05/2015  Patient Care Team: Shirline Frees, MD as PCP - General (Family Medicine) Brunetta Genera, MD as Consulting Physician (Hematology)  SUMMARY OF ONCOLOGIC HISTORY:   Pancreatic carcinoma metastatic to liver   02/15/2015 Imaging CT Abd IMPRESSION: 1. Widespread hepatic metastatic disease. No underlying morphologic changes of cirrhosis are demonstrated to suggest multifocal hepatocellular carcinoma. 2. No primary malignancy identified within the abdomen. The pelvis was not imaged.    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 Widespread neoplastic disease throughout the liver and lymph nodes of the upper abdomen. 2. No definite extrahepatic primary malignancy identified. 3. Potential early nodal metastases within the right internal mammary, subcarinal and left hilar station   03/07/2015 Initial Biopsy Preliminary results of liver lesion biopsy as per discussion with pathology Dr Donato Heinz consistent with metastatic pancreatic carcinoma. Final results pending additional IHC stains.   03/10/2015 -  Chemotherapy Started on Gemcitabine +  Abraxane chemotherapy cycle 1   03/17/2015 -  Chemotherapy Cycle 1 day 8. Abraxane held due to elevated LFTs likely related to excessive acetaminophen intake. Received gemcitabine.   04/01/2015 -  Chemotherapy Cycle 2 day 1. Dose reduce gemcitabine directing her milligrams per meter square and Abraxane to 100 mg/m    INTERVAL HISTORY:  [name] is here for walking assessment her chemotherapy toxicities. She notes that likely previous cycle she was developed low-grade fever of 100 to 100.5. Some diarrhea grade 2. Grade 2-3 fatigue. Decreased appetite. She also notes that the chemotherapy is causing some mental slowness which tends to improve and her second week of chemotherapy. We got blood tests that showed  mild anemia with hemoglobin of 9.1. She was clinically dehydrated and received a liter of IV fluids. She wanted to discuss goals of care and define what we should do next. She notes that she is likely going to decide to no more chemotherapy and pursue supportive care's. I told her that that is a fairly reasonable option and a very personal one. She wanted to know what other chemotherapy we could try. I provided her and her husband with detailed information about the possible use of FOLFOX. She mentioned that she will think about it. I assured her that I will continue to care for her and keep her comfortable irrespective of her final decision whether to pursue further treatments or not. She notes that her pain is well-controlled. Nausea is well-controlled at this time but she did need to use Compazine in addition to Zofran for nausea control. She understands that any further chemotherapy and will also be based off of her improvement in liver function tests and overall functional status. She has been enjoying spending some time in her garden which is very dear to her. She and her husband Judy Cummings have had neighbors help them with cleaning the outside of the house, getting food and helping with the yard. Judy Cummings mentions that she would like to spend as much time as she can and her yard and really enjoys watching the birds and her Hummingbird's fly around.   REVIEW OF SYSTEMS:   10 point review of systems was done and is negative except as noted above.  I have reviewed the past medical history, past surgical history, social history and family history with the patient and they are unchanged from previous note.  ALLERGIES:  is allergic to boniva;  delsym; erythromycin; fosamax; glucosamine forte; guaifenesin & derivatives; lactose intolerance (gi); nexium; tegretol; and zostavax.  MEDICATIONS:  Current Outpatient Prescriptions  Medication Sig Dispense Refill  . AMBULATORY NON FORMULARY MEDICATION Take 90 mg by  mouth 2 (two) times daily. Medication Name: Brilinta 90 mg BID provided by [employer] Research study (Do Not Fill)    . AMBULATORY NON FORMULARY MEDICATION Take 81 mg by mouth daily. Medication Name: ASA 81 mg daily Provided by [employer] study    . amoxicillin-clavulanate (AUGMENTIN) 875-125 MG per tablet Take 1 tablet by mouth 2 (two) times daily. 20 tablet 0  . atorvastatin (LIPITOR) 80 MG tablet Take 1 tablet (80 mg total) by mouth daily at 6 PM. (Patient not taking: Reported on 03/31/2015) 30 tablet 11  . Azelaic Acid 15 % cream Apply 1 application topically daily as needed (for acne and rosacea).     . ciprofloxacin (CIPRO) 500 MG tablet Take 1 tablet (500 mg total) by mouth 2 (two) times daily. 20 tablet 0  . dexamethasone (DECADRON) 2 MG tablet Take 1 tablet (2 mg total) by mouth 2 (two) times daily with a meal. (Patient taking differently: Take 2 mg by mouth 2 (two) times daily with a meal. Take for 3 days after chemo treatment.) 30 tablet 0  . feeding supplement (BOOST / RESOURCE BREEZE) LIQD Take 1 Container by mouth daily.  0  . fluticasone (FLONASE) 50 MCG/ACT nasal spray Place 1 spray into both nostrils daily as needed for allergies.     [name] ibuprofen (ADVIL,MOTRIN) 200 MG tablet Take 200 mg by mouth every 6 (six) hours as needed.    . lactase (LACTAID) 3000 UNITS tablet Take 1 tablet by mouth daily as needed (for lactose intolerance).    . lansoprazole (PREVACID) 30 MG capsule Take 1 capsule (30 mg total) by mouth daily at 12 noon. 30 capsule 0  . LORazepam (ATIVAN) 0.5 MG tablet Take 1-2 tablets (0.5-1 mg total) by mouth every 8 (eight) hours. (Patient taking differently: Take 0.25 mg by mouth every 8 (eight) hours. ) 50 tablet 0  . losartan (COZAAR) 25 MG tablet Take 1 tablet (25 mg total) by mouth daily. 30 tablet 6  . metoprolol tartrate (LOPRESSOR) 25 MG tablet Take 0.5 tablets (12.5 mg total) by mouth 2 (two) times daily. 60 tablet 4  . ondansetron (ZOFRAN) 8 MG tablet Take 1 tablet (8  mg total) by mouth every 8 (eight) hours as needed for nausea or vomiting. 30 tablet 3  . oxyCODONE (OXY IR/ROXICODONE) 5 MG immediate release tablet Take 1 tablet (5 mg total) by mouth every 4 (four) hours as needed for severe pain. (Patient taking differently: Take 5 mg by mouth every 4 (four) hours as needed for severe pain (for cough). ) 60 tablet 0  . PRESCRIPTION MEDICATION Chemo [facility]    . prochlorperazine (COMPAZINE) 10 MG tablet Take 1 tablet (10 mg total) by mouth every 8 (eight) hours as needed for refractory nausea / vomiting (if nausea not responding to zofran). 30 tablet 0  . senna-docusate (SENNA S) 8.6-50 MG per tablet Take 2 tablets by mouth at bedtime. May increase to 2 tab twice daily if constipation persistent 60 tablet 1  . TEGRETOL-XR 200 MG 12 hr tablet Take 1 tablet (200 mg total) by mouth 2 (two) times daily. 60 tablet 2   No current facility-administered medications for this visit.    PHYSICAL EXAMINATION: ECOG PERFORMANCE STATUS: 1 - Symptomatic but completely ambulatory  Filed Vitals:  04/05/15 1352  BP: 140/71  Pulse: 86  Temp: 100.1 F (37.8 C)  Resp: 18   Filed Weights   04/05/15 1352  Weight: 118 lb 9.6 oz (53.797 kg)   GENERAL: Alert, no distress and comfortable SKIN: skin color, texture, turgor are normal, no rashes or significant lesions EYES: normal, Conjunctiva clear, sclera clear OROPHARYNX: no exudate, no erythema and lips, buccal mucosa, and tongue normal  NECK: supple, thyroid normal size, non-tender, without nodularity LYMPH:  no palpable lymphadenopathy in the cervical, axillary or inguinal LUNGS: clear to auscultation and percussion with normal breathing effort HEART: regular rate & rhythm and no murmurs and no lower extremity edema ABDOMEN:abdomen soft, non-tender and normal bowel sounds Musculoskeletal:no cyanosis of digits and no clubbing  NEURO: alert & oriented x 3 with fluent speech, no focal motor/sensory deficits  LABORATORY  DATA:    . CBC Latest Ref Rng 04/05/2015 03/31/2015 03/24/2015  WBC 3.9 - 10.3 10e3/uL 5.3 10.1 5.4  Hemoglobin 11.6 - 15.9 g/dL 9.1(L) 10.7(L) 9.7(L)  Hematocrit 34.8 - 46.6 % 26.5(L) 31.4(L) 27.8(L)  Platelets 145 - 400 10e3/uL 350 476(H) 125(L)     CMP Latest Ref Rng 03/31/2015 03/24/2015 03/19/2015  Glucose 70 - 140 mg/dl 121 140 95  BUN 7.0 - 26.0 mg/dL 8.6 6.2(L) 12  Creatinine 0.6 - 1.1 mg/dL 0.6 0.6 0.62  Sodium 136 - 145 mEq/L 135(L) 134(L) 128(L)  Potassium 3.5 - 5.1 mEq/L 3.6 3.7 4.6  Chloride 101 - 111 mmol/L - - 100(L)  CO2 22 - 29 mEq/L 27 27 24   Calcium 8.4 - 10.4 mg/dL 9.6 9.4 8.8(L)  Total Protein 6.4 - 8.3 g/dL 6.6 6.4 7.1  Total Bilirubin 0.20 - 1.20 mg/dL 0.96 0.69 1.2  Alkaline Phos 40 - 150 U/L 296(H) 278(H) 308(H)  AST 5 - 34 U/L 359(HH) 405 Repeated and Verified(HH) 260(H)  ALT 0 - 55 U/L 273(H) 244(H) 184(H)    Lab Results  Component Value Date   WBC 5.3 04/05/2015   NEUTROABS 4.6 04/05/2015   HGB 9.1* 04/05/2015   HCT 26.5* 04/05/2015   MCV 95.7 04/05/2015   PLT 350 04/05/2015     RADIOGRAPHIC STUDIES: Ultrasound of bilateral lower extremities done 03/25/2015: no evidence of DVTs.  ASSESSMENT & PLAN:   1) Pancreatic carcinoma metastatic to the liver.  Patient has significant liver involvement with metastases involving perhaps 50 percent or more of her liver. ECOG performance status 1-2 .   Pancreatic carcinoma metastatic to liver   02/15/2015 Imaging CT Abd IMPRESSION: 1. Widespread hepatic metastatic disease. No underlying morphologic changes of cirrhosis are demonstrated to suggest multifocal hepatocellular carcinoma. 2. No primary malignancy identified within the abdomen. The pelvis was not imaged.    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 Widespread neoplastic disease throughout the liver and lymph nodes of the upper abdomen. 2. No definite  extrahepatic primary malignancy identified. 3. Potential early nodal metastases within the right internal mammary, subcarinal and left hilar station   03/07/2015 Initial Biopsy Preliminary results of liver lesion biopsy as per discussion with pathology Dr Donato Heinz consistent with metastatic pancreatic carcinoma. Final results pending additional IHC stains.   03/10/2015 -  Chemotherapy Started on Gemcitabine +  Abraxane chemotherapy cycle 1   03/17/2015 -  Chemotherapy Cycle 1 day 8. Abraxane held due to elevated LFTs likely related to excessive acetaminophen intake. Received gemcitabine.   04/01/2015 -  Chemotherapy  Cycle 2 day 1. Dose reduce gemcitabine directing her milligrams per meter square and Abraxane to 100 mg/m   Plan - patient tending towards likely not pursuing additional chemotherapy pursuing best supportive cares which is not an unreasonable option given that additional chemotherapy is likely to cause ongoing fatigue further adverse effects and is not the most likely to significantly control her advanced metastatic pancreatic cancer is of palliative intent only. - we talked about the possibility of using FOLFOX if she were to feel better and if her liver functions were stable. she was given information about this and she'll make a final decision by early next week. - I sure her that we will continue to follow her for symptom and pain management . - received IV fluids for dehydration today along with follicular replacement . - we will transfuse her when necessary for symptomatic anemia not needed today .  2) non-neutropenic low-grade fever likely related to gemcitabine chemotherapy and liver inflammation as well as could be tumor fever. It lasted 3-4 days after the previous cycle of chemotherapy.  I followed up with the patient again on 04/07/2015 and her fever and diarrhea had resolved and she was feeling better.  No antibiotics were indicated . Plan  -She was counseled to call us if her fevers  got worse.  Return to care with Dr. Irene Limbo on 04/14/2015 for ongoing cares . Patient will decide regarding getting a repeat labs and chemotherapy .  All questions were answered. The patient knows to call the clinic with any problems, questions or concerns.  I spent 20 minutes counseling the patient face to face. The total time spent in the appointment was 25 minutes and more than 50% was on counseling and review of test results   Sullivan Lone MD [facility]  (Office):       [phone] (Work cell):  [phone] (Fax):           [phone]

## 2015-04-09 ENCOUNTER — Encounter: Payer: Self-pay | Admitting: Hematology

## 2015-04-11 ENCOUNTER — Other Ambulatory Visit: Payer: Self-pay | Admitting: *Deleted

## 2015-04-11 ENCOUNTER — Other Ambulatory Visit: Payer: Self-pay | Admitting: Hematology

## 2015-04-11 ENCOUNTER — Telehealth: Payer: Self-pay | Admitting: Hematology

## 2015-04-11 DIAGNOSIS — A0472 Enterocolitis due to Clostridium difficile, not specified as recurrent: Secondary | ICD-10-CM

## 2015-04-11 MED ORDER — VANCOMYCIN HCL 125 MG PO CAPS: 125.0000 mg | ORAL_CAPSULE | Freq: Four times a day (QID) | ORAL | Status: DC

## 2015-04-11 NOTE — Telephone Encounter (Signed)
pt spouse [name] cld to r/s pt appt-gave spouse r/s time & date

## 2015-04-11 NOTE — Progress Notes (Signed)
Patient will not receive chemotherapy 04/14/15. Patient will need possible blood/fluids per MD Irene Limbo. Will keep infusion appointment in case of either.

## 2015-04-14 ENCOUNTER — Encounter: Payer: Self-pay | Admitting: Hematology

## 2015-04-14 ENCOUNTER — Ambulatory Visit: Payer: Medicare PPO | Admitting: Hematology

## 2015-04-14 ENCOUNTER — Ambulatory Visit (HOSPITAL_BASED_OUTPATIENT_CLINIC_OR_DEPARTMENT_OTHER): Payer: Medicare PPO

## 2015-04-14 ENCOUNTER — Other Ambulatory Visit: Payer: Medicare PPO

## 2015-04-14 ENCOUNTER — Ambulatory Visit (HOSPITAL_BASED_OUTPATIENT_CLINIC_OR_DEPARTMENT_OTHER): Payer: Medicare PPO | Admitting: Hematology

## 2015-04-14 ENCOUNTER — Telehealth: Payer: Self-pay | Admitting: Hematology

## 2015-04-14 ENCOUNTER — Ambulatory Visit: Payer: Medicare PPO

## 2015-04-14 VITALS — BP 150/97 | HR 76 | Temp 98.4°F | Resp 18 | Ht 63.0 in | Wt 120.0 lb

## 2015-04-14 DIAGNOSIS — R609 Edema, unspecified: Secondary | ICD-10-CM

## 2015-04-14 DIAGNOSIS — C787 Secondary malignant neoplasm of liver and intrahepatic bile duct: Secondary | ICD-10-CM | POA: Diagnosis not present

## 2015-04-14 DIAGNOSIS — R509 Fever, unspecified: Secondary | ICD-10-CM | POA: Diagnosis not present

## 2015-04-14 DIAGNOSIS — C259 Malignant neoplasm of pancreas, unspecified: Secondary | ICD-10-CM

## 2015-04-14 DIAGNOSIS — J40 Bronchitis, not specified as acute or chronic: Secondary | ICD-10-CM

## 2015-04-14 DIAGNOSIS — C799 Secondary malignant neoplasm of unspecified site: Secondary | ICD-10-CM

## 2015-04-14 LAB — URINALYSIS, MICROSCOPIC - CHCC
BLOOD: NEGATIVE
Bilirubin (Urine): NEGATIVE
GLUCOSE UR CHCC: NEGATIVE mg/dL
Ketones: NEGATIVE mg/dL
LEUKOCYTE ESTERASE: NEGATIVE
NITRITE: NEGATIVE
PH: 6 (ref 4.6–8.0)
RBC / HPF: NEGATIVE (ref 0–2)
SPECIFIC GRAVITY, URINE: 1.02 (ref 1.003–1.035)
UROBILINOGEN UR: 0.2 mg/dL (ref 0.2–1)

## 2015-04-14 LAB — COMPREHENSIVE METABOLIC PANEL (CC13)
ALK PHOS: 248 U/L — AB (ref 40–150)
ALT: 49 U/L (ref 0–55)
AST: 64 U/L — AB (ref 5–34)
Albumin: 2.8 g/dL — ABNORMAL LOW (ref 3.5–5.0)
Anion Gap: 6 mEq/L (ref 3–11)
BILIRUBIN TOTAL: 0.75 mg/dL (ref 0.20–1.20)
BUN: 6.5 mg/dL — AB (ref 7.0–26.0)
CALCIUM: 9.2 mg/dL (ref 8.4–10.4)
CO2: 26 mEq/L (ref 22–29)
Chloride: 102 mEq/L (ref 98–109)
Creatinine: 0.7 mg/dL (ref 0.6–1.1)
GLUCOSE: 139 mg/dL (ref 70–140)
Potassium: 3.9 mEq/L (ref 3.5–5.1)
SODIUM: 135 meq/L — AB (ref 136–145)
TOTAL PROTEIN: 6.2 g/dL — AB (ref 6.4–8.3)

## 2015-04-14 LAB — CBC WITH DIFFERENTIAL/PLATELET
BASO%: 1.2 % (ref 0.0–2.0)
Basophils Absolute: 0.1 10*3/uL (ref 0.0–0.1)
EOS ABS: 0.5 10*3/uL (ref 0.0–0.5)
EOS%: 8.4 % — ABNORMAL HIGH (ref 0.0–7.0)
HEMATOCRIT: 30.7 % — AB (ref 34.8–46.6)
HEMOGLOBIN: 10.2 g/dL — AB (ref 11.6–15.9)
LYMPH#: 0.6 10*3/uL — AB (ref 0.9–3.3)
LYMPH%: 9.6 % — ABNORMAL LOW (ref 14.0–49.7)
MCH: 33 pg (ref 25.1–34.0)
MCHC: 33.2 g/dL (ref 31.5–36.0)
MCV: 99.4 fL (ref 79.5–101.0)
MONO#: 1.2 10*3/uL — ABNORMAL HIGH (ref 0.1–0.9)
MONO%: 20.6 % — AB (ref 0.0–14.0)
NEUT%: 60.2 % (ref 38.4–76.8)
NEUTROS ABS: 3.6 10*3/uL (ref 1.5–6.5)
Platelets: 241 10*3/uL (ref 145–400)
RBC: 3.09 10*6/uL — ABNORMAL LOW (ref 3.70–5.45)
RDW: 21.1 % — AB (ref 11.2–14.5)
WBC: 6 10*3/uL (ref 3.9–10.3)

## 2015-04-14 LAB — RETICULOCYTES
Immature Retic Fract: 11.4 % — ABNORMAL HIGH (ref 1.60–10.00)
RBC: 3.04 10*6/uL — ABNORMAL LOW (ref 3.70–5.45)
Retic %: 6.25 % — ABNORMAL HIGH (ref 0.70–2.10)
Retic Ct Abs: 190 10*3/uL — ABNORMAL HIGH (ref 33.70–90.70)

## 2015-04-14 MED ORDER — SPIRONOLACTONE 25 MG PO TABS: 25.0000 mg | ORAL_TABLET | Freq: Every day | ORAL | Status: DC

## 2015-04-14 NOTE — Telephone Encounter (Signed)
Gave adn printed appt sched and avs for pt for Sept °

## 2015-04-15 ENCOUNTER — Ambulatory Visit: Payer: Medicare PPO

## 2015-04-15 LAB — URINE CULTURE

## 2015-04-18 MED ORDER — LANSOPRAZOLE 30 MG PO CPDR: 30.0000 mg | DELAYED_RELEASE_CAPSULE | Freq: Every day | ORAL | Status: DC

## 2015-04-18 NOTE — Progress Notes (Signed)
[name]  HEMATOLOGY ONCOLOGY PROGRESS NOTE  Date of service 04/05/2015  Patient Care Team: Shirline Frees, MD as PCP - General (Family Medicine) Brunetta Genera, MD as Consulting Physician (Hematology)  SUMMARY OF ONCOLOGIC HISTORY:   Pancreatic carcinoma metastatic to liver   02/15/2015 Imaging CT Abd IMPRESSION: 1. Widespread hepatic metastatic disease. No underlying morphologic changes of cirrhosis are demonstrated to suggest multifocal hepatocellular carcinoma. 2. No primary malignancy identified within the abdomen. The pelvis was not imaged.    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 Widespread neoplastic disease throughout the liver and lymph nodes of the upper abdomen. 2. No definite extrahepatic primary malignancy identified. 3. Potential early nodal metastases within the right internal mammary, subcarinal and left hilar station   03/07/2015 Initial Biopsy Preliminary results of liver lesion biopsy as per discussion with pathology Dr Donato Heinz consistent with metastatic pancreatic carcinoma. Final results pending additional IHC stains.   03/10/2015 -  Chemotherapy Started on Gemcitabine +  Abraxane chemotherapy cycle 1   03/17/2015 -  Chemotherapy Cycle 1 day 8. Abraxane held due to elevated LFTs likely related to excessive acetaminophen intake. Received gemcitabine.   04/01/2015 -  Chemotherapy Cycle 2 day 1. Dose reduce gemcitabine directing her milligrams per meter square and Abraxane to 100 mg/m    INTERVAL HISTORY:  [name] is here for her scheduled followup.  She notes that the second week of chemotherapy has been a little better with less nausea and some improvement in her energy.  She notes that her abdomen feels a little more distended.  Still having leg swelling.  Still having some low-grade fevers for which she has been using when necessary ibuprofen.  She notes that she is quite  clear that she has decided not to consider any additional chemotherapy at this time which we discussed was a reasonable approach.  Her CBC shows that her hemoglobin is improved.  Her liver function tests look little better.  Unfortunately despite a couple of cycles of chemotherapy her CA 19-9 levels continue to increase.  It appears that she may be developing some ascites.  We discussed asked about whether we should repeat imaging to see where things stand and to see if she has any abdominal ascitic fluid that potentially might be a cause of her fever.  She is to look a few loose stools despite taking the oral vancomycin.  He did spend a fair amount of time with several visiting family members which made her happy. She appears to be at peace with her difficult and challenging situation.  I can see her husband Judy Cummings trying to be strong to support her through this.  Their faith certainly help them in this difficult situation.   REVIEW OF SYSTEMS:   10 point review of systems was done and is negative except as noted above.  I have reviewed the past medical history, past surgical history, social history and family history with the patient and they are unchanged from previous note.  ALLERGIES:  is allergic to boniva; delsym; erythromycin; fosamax; glucosamine forte; guaifenesin & derivatives; lactose intolerance (gi); nexium; tegretol; and zostavax.  MEDICATIONS:  Current Outpatient Prescriptions  Medication Sig Dispense Refill  . AMBULATORY NON FORMULARY MEDICATION Take 90 mg by mouth 2 (two) times daily. Medication Name: Brilinta 90 mg BID provided by [facility] Research study (Do Not Fill)    . AMBULATORY NON FORMULARY MEDICATION Take 81  mg by mouth daily. Medication Name: ASA 81 mg daily Provided by [facility] study    . Azelaic Acid 15 % cream Apply 1 application topically daily as needed (for acne and rosacea).     [name] dexamethasone (DECADRON) 2 MG tablet Take 1 tablet (2 mg total) by mouth 2 (two)  times daily with a meal. (Patient taking differently: Take 2 mg by mouth 2 (two) times daily with a meal. Take for 3 days after chemo treatment.) 30 tablet 0  . feeding supplement (BOOST / RESOURCE BREEZE) LIQD Take 1 Container by mouth daily.  0  . fluticasone (FLONASE) 50 MCG/ACT nasal spray Place 1 spray into both nostrils daily as needed for allergies.     [name] ibuprofen (ADVIL,MOTRIN) 200 MG tablet Take 200 mg by mouth every 6 (six) hours as needed.    . lactase (LACTAID) 3000 UNITS tablet Take 1 tablet by mouth daily as needed (for lactose intolerance).    . lansoprazole (PREVACID) 30 MG capsule Take 1 capsule (30 mg total) by mouth daily before breakfast. 60 capsule 0  . LORazepam (ATIVAN) 0.5 MG tablet Take 1-2 tablets (0.5-1 mg total) by mouth every 8 (eight) hours. (Patient taking differently: Take 0.25 mg by mouth every 8 (eight) hours. ) 50 tablet 0  . losartan (COZAAR) 25 MG tablet Take 1 tablet (25 mg total) by mouth daily. 30 tablet 6  . metoprolol tartrate (LOPRESSOR) 25 MG tablet Take 0.5 tablets (12.5 mg total) by mouth 2 (two) times daily. 60 tablet 4  . ondansetron (ZOFRAN) 8 MG tablet Take 1 tablet (8 mg total) by mouth every 8 (eight) hours as needed for nausea or vomiting. 30 tablet 3  . oxyCODONE (OXY IR/ROXICODONE) 5 MG immediate release tablet Take 1 tablet (5 mg total) by mouth every 4 (four) hours as needed for severe pain. (Patient taking differently: Take 5 mg by mouth every 4 (four) hours as needed for severe pain (for cough). ) 60 tablet 0  . PRESCRIPTION MEDICATION Chemo [facility]    . prochlorperazine (COMPAZINE) 10 MG tablet Take 1 tablet (10 mg total) by mouth every 8 (eight) hours as needed for refractory nausea / vomiting (if nausea not responding to zofran). 30 tablet 0  . senna-docusate (SENNA S) 8.6-50 MG per tablet Take 2 tablets by mouth at bedtime. May increase to 2 tab twice daily if constipation persistent 60 tablet 1  . spironolactone (ALDACTONE) 25 MG tablet Take  1 tablet (25 mg total) by mouth daily. 60 tablet 0  . TEGRETOL-XR 200 MG 12 hr tablet Take 1 tablet (200 mg total) by mouth 2 (two) times daily. 60 tablet 2  . vancomycin (VANCOCIN) 125 MG capsule Take 1 capsule (125 mg total) by mouth 4 (four) times daily. For 10 days 40 capsule 0   No current facility-administered medications for this visit.    PHYSICAL EXAMINATION: ECOG PERFORMANCE STATUS: 1 - Symptomatic but completely ambulatory  Filed Vitals:   04/14/15 1020  BP: 150/97  Pulse: 76  Temp: 98.4 F (36.9 C)  Resp: 18   Filed Weights   04/14/15 1020  Weight: 120 lb (54.432 kg)   GENERAL: Alert, no distress and comfortable SKIN: skin color, texture, turgor are normal, no rashes or significant lesions EYES: normal, Conjunctiva clear, sclera clear OROPHARYNX: no exudate, no erythema and lips, buccal mucosa, and tongue normal  NECK: supple, thyroid normal size, non-tender, without nodularity LYMPH:  no palpable lymphadenopathy in the cervical, axillary or inguinal LUNGS: clear to  auscultation and percussion with normal breathing effort HEART: regular rate & rhythm and no murmurs  ABDOMEN:abdomen soft, somewhat distended with hepatomegaly no palpable splenomegaly Musculoskeletal:bilateral 1-2+ pitting pedal edema NEURO: alert & oriented x 3 with fluent speech, no focal motor/sensory deficits  LABORATORY DATA:    . CBC Latest Ref Rng 04/14/2015 04/05/2015 03/31/2015  WBC 3.9 - 10.3 10e3/uL 6.0 5.3 10.1  Hemoglobin 11.6 - 15.9 g/dL 10.2(L) 9.1(L) 10.7(L)  Hematocrit 34.8 - 46.6 % 30.7(L) 26.5(L) 31.4(L)  Platelets 145 - 400 10e3/uL 241 350 476(H)     CMP Latest Ref Rng 04/14/2015 03/31/2015 03/24/2015  Glucose 70 - 140 mg/dl 139 121 140  BUN 7.0 - 26.0 mg/dL 6.5(L) 8.6 6.2(L)  Creatinine 0.6 - 1.1 mg/dL 0.7 0.6 0.6  Sodium 136 - 145 mEq/L 135(L) 135(L) 134(L)  Potassium 3.5 - 5.1 mEq/L 3.9 3.6 3.7  Chloride 101 - 111 mmol/L - - -  CO2 22 - 29 mEq/L 26 27 27   Calcium 8.4 - 10.4  mg/dL 9.2 9.6 9.4  Total Protein 6.4 - 8.3 g/dL 6.2(L) 6.6 6.4  Total Bilirubin 0.20 - 1.20 mg/dL 0.75 0.96 0.69  Alkaline Phos 40 - 150 U/L 248(H) 296(H) 278(H)  AST 5 - 34 U/L 64(H) 359(HH) 405 Repeated and Verified(HH)  ALT 0 - 55 U/L 49 273(H) 244(H)    Lab Results  Component Value Date   WBC 6.0 04/14/2015   NEUTROABS 3.6 04/14/2015   HGB 10.2* 04/14/2015   HCT 30.7* 04/14/2015   MCV 99.4 04/14/2015   PLT 241 04/14/2015     RADIOGRAPHIC STUDIES: Ultrasound of bilateral lower extremities done 03/25/2015: no evidence of DVTs.  ASSESSMENT & PLAN:   1) Pancreatic carcinoma metastatic to the liver.  Patient has significant liver involvement with metastases involving perhaps 50 percent or more of her liver. ECOG performance status 1-2 .   Pancreatic carcinoma metastatic to liver   02/15/2015 Imaging CT Abd IMPRESSION: 1. Widespread hepatic metastatic disease. No underlying morphologic changes of cirrhosis are demonstrated to suggest multifocal hepatocellular carcinoma. 2. No primary malignancy identified within the abdomen. The pelvis was not imaged.    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 Widespread neoplastic disease throughout the liver and lymph nodes of the upper abdomen. 2. No definite extrahepatic primary malignancy identified. 3. Potential early nodal metastases within the right internal mammary, subcarinal and left hilar station   03/07/2015 Initial Biopsy Preliminary results of liver lesion biopsy as per discussion with pathology Dr Donato Heinz consistent with metastatic pancreatic carcinoma. Final results pending additional IHC stains.   03/10/2015 -  Chemotherapy Started on Gemcitabine +  Abraxane chemotherapy cycle 1   03/17/2015 -  Chemotherapy Cycle 1 day 8. Abraxane held due to elevated LFTs likely related to excessive acetaminophen intake. Received gemcitabine.   04/01/2015 -   Chemotherapy Cycle 2 day 1. Dose reduce gemcitabine directing her milligrams per meter square and Abraxane to 100 mg/m   Plan -labs appear stable today with improvement in her liver functions. -Her CA 19-9 levels continued to rise despite the treatment she has received thus far. -Patient is making a clear and educated decision not to pursue any additional chemotherapy after extensively invading the pros and cons of this and understanding that any treatment would only be palliative at this point and appears to have a significant bearing on her quality of life. -Palliative care team is following the  patient at home.  We will discuss at next visit if she wants to hospice services officially. -continue other symptom control medications  2) non-neutropenic low-grade fever - This is likely tumor fever.  Patient has no focal symptoms suggestive of an alternative source of infection.  Has developed some abdominal distention with likely ascites cannot rule out the possibility of low-grade SBP On empiric treatment for C. Difficile given diarrhea Plan  -as needed twice a day naproxen for tumor fever -cannot use dexamethasone due to issues with leg swelling -We'll get a CT chest abdomen pelvis and the patient has significant ascites will consider tapping it to rule out SBP.  Return to care with Dr. Irene Limbo on 04/27/2015 for ongoing cares .  All questions were answered. The patient knows to call the clinic with any problems, questions or concerns.  I spent 20 minutes counseling the patient face to face. The total time spent in the appointment was 25 minutes and more than 50% was on counseling and review of test results   Sullivan Lone MD [place]  (Office):       [phone] (Work cell):  [phone] (Fax):           [phone]

## 2015-04-19 ENCOUNTER — Telehealth: Payer: Self-pay | Admitting: Hematology

## 2015-04-19 LAB — INFLUENZA A AND B

## 2015-04-19 LAB — CANCER ANTIGEN 19-9: CA 19-9: 19233.7 U/mL — ABNORMAL HIGH (ref <35.0)

## 2015-04-19 NOTE — Telephone Encounter (Signed)
Patient stopped by to reschedule her 9/7 genetics appointment to 4/03 due to conflict. Date per patient.

## 2015-04-20 ENCOUNTER — Ambulatory Visit (HOSPITAL_COMMUNITY)
Admission: RE | Admit: 2015-04-20 | Discharge: 2015-04-20 | Disposition: A | Discharge status: Home / Self Care | Payer: Medicare PPO | Source: Ambulatory Visit | Attending: Hematology | Admitting: Hematology

## 2015-04-20 ENCOUNTER — Encounter: Payer: Medicare PPO | Admitting: Genetic Counselor

## 2015-04-20 ENCOUNTER — Encounter (HOSPITAL_COMMUNITY): Payer: Self-pay

## 2015-04-20 ENCOUNTER — Other Ambulatory Visit: Payer: Medicare PPO

## 2015-04-20 DIAGNOSIS — C259 Malignant neoplasm of pancreas, unspecified: Secondary | ICD-10-CM | POA: Diagnosis not present

## 2015-04-20 DIAGNOSIS — J90 Pleural effusion, not elsewhere classified: Secondary | ICD-10-CM | POA: Insufficient documentation

## 2015-04-20 DIAGNOSIS — R918 Other nonspecific abnormal finding of lung field: Secondary | ICD-10-CM | POA: Insufficient documentation

## 2015-04-20 DIAGNOSIS — C787 Secondary malignant neoplasm of liver and intrahepatic bile duct: Secondary | ICD-10-CM | POA: Insufficient documentation

## 2015-04-20 DIAGNOSIS — R188 Other ascites: Secondary | ICD-10-CM | POA: Diagnosis not present

## 2015-04-20 MED ORDER — IOHEXOL 300 MG/ML  SOLN
25.0000 mL | INTRAMUSCULAR | Status: AC
  Administered 2015-04-20: 25 mL via ORAL

## 2015-04-20 MED ORDER — IOHEXOL 300 MG/ML  SOLN
100.0000 mL | Freq: Once | INTRAMUSCULAR | Status: AC | PRN
  Administered 2015-04-20: 80 mL via INTRAVENOUS

## 2015-04-21 ENCOUNTER — Other Ambulatory Visit: Payer: Medicare PPO

## 2015-04-21 ENCOUNTER — Other Ambulatory Visit: Payer: Self-pay | Admitting: Hematology

## 2015-04-21 DIAGNOSIS — R188 Other ascites: Secondary | ICD-10-CM | POA: Insufficient documentation

## 2015-04-21 DIAGNOSIS — E8809 Other disorders of plasma-protein metabolism, not elsewhere classified: Secondary | ICD-10-CM

## 2015-04-22 ENCOUNTER — Telehealth: Payer: Self-pay | Admitting: *Deleted

## 2015-04-22 ENCOUNTER — Telehealth: Payer: Self-pay | Admitting: Hematology

## 2015-04-22 NOTE — Telephone Encounter (Signed)
s.w. pt and advised on appt...pt ok and aware °

## 2015-04-22 NOTE — Telephone Encounter (Signed)
Per staff message and POF I have scheduled appts. Advised scheduler of appts. JMW  

## 2015-04-22 NOTE — Telephone Encounter (Signed)
NOTIFIED [facility] THAT [facility] WILL GIVE PT. IV ALBUMIN AT 8:00AM ON 04/25/15.

## 2015-04-25 ENCOUNTER — Other Ambulatory Visit: Payer: Self-pay | Admitting: Hematology

## 2015-04-25 ENCOUNTER — Ambulatory Visit (HOSPITAL_COMMUNITY)
Admission: RE | Admit: 2015-04-25 | Discharge: 2015-04-25 | Disposition: A | Discharge status: Home / Self Care | Payer: Medicare PPO | Source: Ambulatory Visit | Attending: Hematology | Admitting: Hematology

## 2015-04-25 ENCOUNTER — Ambulatory Visit (HOSPITAL_BASED_OUTPATIENT_CLINIC_OR_DEPARTMENT_OTHER): Payer: Medicare PPO

## 2015-04-25 VITALS — BP 155/65 | HR 78 | Temp 98.9°F | Resp 20

## 2015-04-25 DIAGNOSIS — R188 Other ascites: Secondary | ICD-10-CM | POA: Insufficient documentation

## 2015-04-25 DIAGNOSIS — E8809 Other disorders of plasma-protein metabolism, not elsewhere classified: Secondary | ICD-10-CM | POA: Diagnosis not present

## 2015-04-25 DIAGNOSIS — C787 Secondary malignant neoplasm of liver and intrahepatic bile duct: Secondary | ICD-10-CM | POA: Diagnosis present

## 2015-04-25 MED ORDER — ALBUMIN HUMAN 25 % IV SOLN
25.0000 g | Freq: Once | INTRAVENOUS | Status: DC
Start: 2015-04-25 — End: 2015-04-25

## 2015-04-25 MED ORDER — ALBUMIN HUMAN 25 % IV SOLN
25.0000 g | Freq: Once | INTRAVENOUS | Status: AC
  Administered 2015-04-25: 25 g via INTRAVENOUS
  Filled 2015-04-25: qty 100

## 2015-04-25 NOTE — Progress Notes (Signed)
Patient ID: HAYLYNN PHA, female   DOB: 12-20-1945, 69 y.o.   MRN: [id number] Patient presented to ultrasound department today for paracentesis. On limited ultrasound of abdomen in all 4 quadrants there is only a small amount of fluid noted primarily in the perihepatic region, not amenable to safely tap at this time. Procedure was canceled. Findings were discussed with Dr. Quintin Alto), Dr. Grier Mitts nurse and pt.

## 2015-04-25 NOTE — Patient Instructions (Signed)
Albumin injection °What is this medicine? °ALBUMIN (al BYOO min) is used to treat or prevent shock following serious injury, bleeding, surgery, or burns by increasing the volume of blood plasma. This medicine can also replace low blood protein. °This medicine may be used for other purposes; ask your health care provider or pharmacist if you have questions. °COMMON BRAND NAME(S): Albuked, Albumarc, Albuminar, Albutein, Buminate, Flexbumin, Kedbumin, Macrotec, Plasbumin °What should I tell my health care provider before I take this medicine? °They need to know if you have any of the following conditions: °-anemia °-heart disease °-kidney disease °-an unusual or allergic reaction to albumin, other medicines, foods, dyes, or preservatives °-pregnant or trying to get pregnant °-breast-feeding °How should I use this medicine? °This medicine is for infusion into a vein. It is given by a health-care professional in a hospital or clinic. °Talk to your pediatrician regarding the use of this medicine in children. While this drug may be prescribed for selected conditions, precautions do apply. °Overdosage: If you think you have taken too much of this medicine contact a poison control center or emergency room at once. °NOTE: This medicine is only for you. Do not share this medicine with others. °What if I miss a dose? °This does not apply. °What may interact with this medicine? °Interactions are not expected. °This list may not describe all possible interactions. Give your health care provider a list of all the medicines, herbs, non-prescription drugs, or dietary supplements you use. Also tell them if you smoke, drink alcohol, or use illegal drugs. Some items may interact with your medicine. °What should I watch for while using this medicine? °Your condition will be closely monitored while you receive this medicine. °Some products are derived from human plasma, and there is a small risk that these products may contain certain  types of virus or bacteria. All products are processed to kill most viruses and bacteria. If you have questions concerning the risk of infections, discuss them with your doctor or health care professional. °What side effects may I notice from receiving this medicine? °Side effects that you should report to your doctor or health care professional as soon as possible: °-allergic reactions like skin rash, itching or hives, swelling of the face, lips, or tongue °-breathing problems °-changes in heartbeat °-fever, chills °-pain, redness or swelling at the injection site °-signs of viral infection including fever, drowsiness, chills, runny nose followed in about 2 weeks by a rash and joint pain °-tightness in the chest °Side effects that usually do not require medical attention (report to your doctor or health care professional if they continue or are bothersome): °-increased salivation °-nausea, vomiting °This list may not describe all possible side effects. Call your doctor for medical advice about side effects. You may report side effects to FDA at 1-800-FDA-1088. °Where should I keep my medicine? °This does not apply. You will not be given this medicine to store at home. °NOTE: This sheet is a summary. It may not cover all possible information. If you have questions about this medicine, talk to your doctor, pharmacist, or health care provider. °© 2015, [employer]. (2007-10-23 10:18:55) ° °

## 2015-04-27 ENCOUNTER — Telehealth: Payer: Self-pay | Admitting: Hematology

## 2015-04-27 ENCOUNTER — Ambulatory Visit (HOSPITAL_BASED_OUTPATIENT_CLINIC_OR_DEPARTMENT_OTHER): Payer: Medicare PPO | Admitting: Hematology

## 2015-04-27 ENCOUNTER — Encounter: Payer: Self-pay | Admitting: Hematology

## 2015-04-27 ENCOUNTER — Other Ambulatory Visit (HOSPITAL_BASED_OUTPATIENT_CLINIC_OR_DEPARTMENT_OTHER): Payer: Medicare PPO

## 2015-04-27 VITALS — BP 162/71 | HR 78 | Temp 99.1°F | Resp 18 | Ht 63.0 in | Wt 115.2 lb

## 2015-04-27 DIAGNOSIS — R188 Other ascites: Secondary | ICD-10-CM

## 2015-04-27 DIAGNOSIS — C787 Secondary malignant neoplasm of liver and intrahepatic bile duct: Secondary | ICD-10-CM

## 2015-04-27 DIAGNOSIS — C259 Malignant neoplasm of pancreas, unspecified: Secondary | ICD-10-CM | POA: Diagnosis not present

## 2015-04-27 DIAGNOSIS — R1013 Epigastric pain: Secondary | ICD-10-CM

## 2015-04-27 DIAGNOSIS — R5081 Fever presenting with conditions classified elsewhere: Secondary | ICD-10-CM

## 2015-04-27 LAB — CBC WITH DIFFERENTIAL/PLATELET
BASO%: 0.5 % (ref 0.0–2.0)
BASOS ABS: 0.1 10*3/uL (ref 0.0–0.1)
EOS ABS: 1 10*3/uL — AB (ref 0.0–0.5)
EOS%: 7.2 % — ABNORMAL HIGH (ref 0.0–7.0)
HCT: 32.3 % — ABNORMAL LOW (ref 34.8–46.6)
HGB: 10.7 g/dL — ABNORMAL LOW (ref 11.6–15.9)
LYMPH%: 3.3 % — AB (ref 14.0–49.7)
MCH: 33.5 pg (ref 25.1–34.0)
MCHC: 33.1 g/dL (ref 31.5–36.0)
MCV: 101.3 fL — AB (ref 79.5–101.0)
MONO#: 1.2 10*3/uL — AB (ref 0.1–0.9)
MONO%: 8.8 % (ref 0.0–14.0)
NEUT#: 11.1 10*3/uL — ABNORMAL HIGH (ref 1.5–6.5)
NEUT%: 80.2 % — ABNORMAL HIGH (ref 38.4–76.8)
PLATELETS: 215 10*3/uL (ref 145–400)
RBC: 3.19 10*6/uL — AB (ref 3.70–5.45)
RDW: 19.7 % — ABNORMAL HIGH (ref 11.2–14.5)
WBC: 13.8 10*3/uL — ABNORMAL HIGH (ref 3.9–10.3)
lymph#: 0.5 10*3/uL — ABNORMAL LOW (ref 0.9–3.3)

## 2015-04-27 LAB — COMPREHENSIVE METABOLIC PANEL (CC13)
ALBUMIN: 2.9 g/dL — AB (ref 3.5–5.0)
ALK PHOS: 325 U/L — AB (ref 40–150)
ALT: 37 U/L (ref 0–55)
ANION GAP: 6 meq/L (ref 3–11)
AST: 87 U/L — ABNORMAL HIGH (ref 5–34)
BILIRUBIN TOTAL: 1.62 mg/dL — AB (ref 0.20–1.20)
BUN: 7.5 mg/dL (ref 7.0–26.0)
CO2: 28 mEq/L (ref 22–29)
Calcium: 9.2 mg/dL (ref 8.4–10.4)
Chloride: 101 mEq/L (ref 98–109)
Creatinine: 0.6 mg/dL (ref 0.6–1.1)
GLUCOSE: 127 mg/dL (ref 70–140)
POTASSIUM: 3.1 meq/L — AB (ref 3.5–5.1)
SODIUM: 136 meq/L (ref 136–145)
Total Protein: 6.5 g/dL (ref 6.4–8.3)

## 2015-04-27 NOTE — Telephone Encounter (Signed)
Pt confirmed labs/ov per 09/14 POF, gave pt AVS and Calendar.... KJ °

## 2015-04-28 ENCOUNTER — Other Ambulatory Visit: Payer: Medicare PPO

## 2015-04-28 ENCOUNTER — Ambulatory Visit: Payer: Medicare PPO

## 2015-04-28 LAB — CANCER ANTIGEN 19-9: CA 19-9: 21682.3 U/mL — ABNORMAL HIGH (ref <35.0)

## 2015-05-02 ENCOUNTER — Encounter: Payer: Self-pay | Admitting: *Deleted

## 2015-05-02 DIAGNOSIS — Z006 Encounter for examination for normal comparison and control in clinical research program: Secondary | ICD-10-CM

## 2015-05-02 NOTE — Progress Notes (Signed)
TWILIGHT 3 month Randomization appointment completed. No adverse events/ bleeding episodes noted per patient. TWILIGHT study bottle # [id number]; [id number]; [id number] dispensed to patient/husband with instructions. Questions encouraged and answered.

## 2015-05-04 ENCOUNTER — Other Ambulatory Visit (HOSPITAL_BASED_OUTPATIENT_CLINIC_OR_DEPARTMENT_OTHER): Payer: Medicare PPO

## 2015-05-04 ENCOUNTER — Encounter: Payer: Self-pay | Admitting: Genetic Counselor

## 2015-05-04 ENCOUNTER — Ambulatory Visit (HOSPITAL_BASED_OUTPATIENT_CLINIC_OR_DEPARTMENT_OTHER): Payer: Medicare PPO | Admitting: Genetic Counselor

## 2015-05-04 DIAGNOSIS — Z803 Family history of malignant neoplasm of breast: Secondary | ICD-10-CM | POA: Diagnosis not present

## 2015-05-04 DIAGNOSIS — C259 Malignant neoplasm of pancreas, unspecified: Secondary | ICD-10-CM | POA: Diagnosis not present

## 2015-05-04 DIAGNOSIS — C787 Secondary malignant neoplasm of liver and intrahepatic bile duct: Secondary | ICD-10-CM

## 2015-05-04 DIAGNOSIS — Z800 Family history of malignant neoplasm of digestive organs: Secondary | ICD-10-CM

## 2015-05-04 LAB — COMPREHENSIVE METABOLIC PANEL (CC13)
ALT: 31 U/L (ref 0–55)
ANION GAP: 5 meq/L (ref 3–11)
AST: 71 U/L — ABNORMAL HIGH (ref 5–34)
Albumin: 2.6 g/dL — ABNORMAL LOW (ref 3.5–5.0)
Alkaline Phosphatase: 375 U/L — ABNORMAL HIGH (ref 40–150)
BILIRUBIN TOTAL: 1.44 mg/dL — AB (ref 0.20–1.20)
BUN: 7 mg/dL (ref 7.0–26.0)
CALCIUM: 9.2 mg/dL (ref 8.4–10.4)
CO2: 27 meq/L (ref 22–29)
CREATININE: 0.6 mg/dL (ref 0.6–1.1)
Chloride: 102 mEq/L (ref 98–109)
EGFR: >90 mL/min/{1.73_m2} (ref >90)
Glucose: 129 mg/dl (ref 70–140)
Potassium: 4.1 mEq/L (ref 3.5–5.1)
Sodium: 134 mEq/L — ABNORMAL LOW (ref 136–145)
TOTAL PROTEIN: 6.6 g/dL (ref 6.4–8.3)

## 2015-05-04 LAB — CBC WITH DIFFERENTIAL/PLATELET
BASO%: 0.8 % (ref 0.0–2.0)
Basophils Absolute: 0.1 10*3/uL (ref 0.0–0.1)
EOS%: 7.3 % — ABNORMAL HIGH (ref 0.0–7.0)
Eosinophils Absolute: 0.8 10*3/uL — ABNORMAL HIGH (ref 0.0–0.5)
HEMATOCRIT: 31.7 % — AB (ref 34.8–46.6)
HEMOGLOBIN: 10.5 g/dL — AB (ref 11.6–15.9)
LYMPH#: 0.8 10*3/uL — AB (ref 0.9–3.3)
LYMPH%: 6.8 % — ABNORMAL LOW (ref 14.0–49.7)
MCH: 34.3 pg — ABNORMAL HIGH (ref 25.1–34.0)
MCHC: 33.1 g/dL (ref 31.5–36.0)
MCV: 103.5 fL — ABNORMAL HIGH (ref 79.5–101.0)
MONO#: 1.3 10*3/uL — AB (ref 0.1–0.9)
MONO%: 11.3 % (ref 0.0–14.0)
NEUT%: 73.8 % (ref 38.4–76.8)
NEUTROS ABS: 8.3 10*3/uL — AB (ref 1.5–6.5)
PLATELETS: 250 10*3/uL (ref 145–400)
RBC: 3.07 10*6/uL — AB (ref 3.70–5.45)
RDW: 19.8 % — ABNORMAL HIGH (ref 11.2–14.5)
WBC: 11.2 10*3/uL — AB (ref 3.9–10.3)

## 2015-05-04 NOTE — Progress Notes (Signed)
REFERRING PROVIDER: Shirline Frees, MD Judy Cummings, Judy Cummings   Judy Ewings, MD  PRIMARY PROVIDER:  Shirline Frees, MD  PRIMARY REASON FOR VISIT:  1. Pancreatic carcinoma metastatic to liver   2. Family history of pancreatic cancer   3. Family history of colon cancer   4. Family history of breast cancer      HISTORY OF PRESENT ILLNESS:   Judy Cummings, a 69 y.o. female, was seen for a  cancer genetics consultation at the request of Dr. Irene Limbo due to a personal and family history of cancer.  Judy Cummings presents to clinic today to discuss the possibility of a hereditary predisposition to cancer, genetic testing, and to further clarify her future cancer risks, as well as potential cancer risks for family members.   In July 2016, at the age of 10, Judy Cummings was diagnosed with adenocarcinoma of the pancreas. This was treated with chemotherapy.      CANCER HISTORY:    Pancreatic carcinoma metastatic to liver   02/15/2015 Imaging CT Abd IMPRESSION: 1. Widespread hepatic metastatic disease. No underlying morphologic changes of cirrhosis are demonstrated to suggest multifocal hepatocellular carcinoma. 2. No primary malignancy identified within the abdomen. The pelvis was not imaged.    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 Widespread neoplastic disease throughout the liver and lymph nodes of the upper abdomen. 2. No definite extrahepatic primary malignancy identified. 3. Potential early nodal metastases within the right internal mammary, subcarinal and left hilar station   03/07/2015 Initial Biopsy Preliminary results of liver lesion biopsy as per discussion with pathology Dr Donato Heinz consistent with metastatic pancreatic carcinoma. Final results pending additional IHC stains.   03/10/2015 -  Chemotherapy Started on Gemcitabine +  Abraxane chemotherapy cycle 1   03/17/2015  -  Chemotherapy Cycle 1 day 8. Abraxane held due to elevated LFTs likely related to excessive acetaminophen intake. Received gemcitabine.   04/01/2015 -  Chemotherapy Cycle 2 day 1. Dose reduce gemcitabine directing her milligrams per meter square and Abraxane to 100 mg/m     HORMONAL RISK FACTORS:  Menarche was at age 82-12.  First live birth at age 69.  OCP use for approximately 10 years.  Ovaries intact: yes.  Hysterectomy: no.  Menopausal status: postmenopausal.  HRT use: 0 years. Colonoscopy: yes; normal. Mammogram within the last year: yes. Number of breast biopsies: 0. Up to date with pelvic exams:  yes. Any excessive radiation exposure in the past:  no  Past Medical History  Diagnosis Date  . Osteopenia   . Hypercholesterolemia   . Menopause   . Rosacea   . Lactose intolerance   . TMJ (dislocation of temporomandibular joint)   . Fatigue   . Rosacea   . TMJ (dislocation of temporomandibular joint)   . Mild aortic stenosis     echo 01/2015  . Coronary artery disease   . Family history of adverse reaction to anesthesia     "my mother gets PONV"  . GERD (gastroesophageal reflux disease)   . Epileptic seizure     "controlled; started w/my periods; last one was years ago" (02/07/2015)  . Sleep disorder   . 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 CA 19-9 levels On 02/21/2015  were noted to be 11,800 making pancreatic adenocarcinoma the likely primary site.   . Family history of pancreatic cancer   . Family history of colon cancer   . Family history of breast cancer     Past Surgical History  Procedure Laterality Date  . Tonsillectomy    . Coronary angioplasty    . Ankle fracture surgery Left ~ 1977    "put 4 pins in"  . Hemangioma excision  ~ 2006    "inside my mouth"  . Hemangioma w/ laser excision   "yearly since ~ 2006"    "inside my mouth"  . Cardiac catheterization N/A 02/07/2015    Procedure: Left Heart Cath and Coronary Angiography;  Surgeon: Burnell Blanks, MD;  Location: [facility];  Service: Cardiovascular;  Laterality: N/A;  . Cardiac catheterization N/A 02/07/2015    Procedure: Coronary Stent Intervention;  Surgeon: Burnell Blanks, MD;  Location: [facility];  Service: Cardiovascular;  Laterality: N/A;    Social History   Social History  . Marital Status: Married    Spouse Name: [name]  . Number of Children: 2  . Years of Education: college   Occupational History  . retired    Social History Main Topics  . Smoking status: Never Smoker   . Smokeless tobacco: Never Used  . Alcohol Use: No  . Drug Use: No  . Sexual Activity: Not on file   Other Topics Concern  . Not on file   Social History Narrative   Married, husband [name]   Children are grown (#2), are grandparents   No pets in home     FAMILY HISTORY:  We obtained a detailed, 4-generation family history.  Significant diagnoses are listed below: Family History  Problem Relation Age of Onset  . Diabetes Mellitus I Mother   . Hypertension Mother   . Atrial fibrillation Mother   . Heart attack Father   . CVA Father   . CAD Father   . Hyperlipidemia Sister   . Heart attack Maternal Grandfather   . CVA Paternal Grandmother   . Colon cancer Paternal Grandfather 108  . Pancreatic cancer Paternal Uncle 75  . Colon cancer Other     PGF's brother  . Breast cancer Other     PGFs sister   The patient has two children and one sister who are all cancer free.  Her mother is alive at 51, but is in palliative care.  Her father died of a stroke at 69.  He had two sisters and one brother.  The brother was diagnosed and died of pancreatic cancer at 91.  Her paternal grandmother died of a stroke, and her grandfather died of colon cancer at age 30.  Her grandfathers brother had colon  cancer and his sister had breast cancer.  Her mother had 9 siblings, none of whom had cancer.  There is no other reported family history of cancer.  Patient's maternal ancestors are of Caucasian descent, and paternal ancestors are of [place] descent. There is no reported Ashkenazi Jewish ancestry. There is no known consanguinity.  GENETIC COUNSELING ASSESSMENT: Judy Cummings is a 69 y.o. female with a personal and family history of cancer which somewhat suggestive of a hereditary cancer syndrome, possibly Lynch syndrome, and predisposition to cancer. We, therefore, discussed and recommended the following at today's visit.   DISCUSSION: We discussed that about 10% of pancreatic cancer is the result of hereditary cancer syndromes. The most common form of pancreatic  cancer is from adenocarcinoma, which many of the genes as associated with this form of cancer.  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  We discussed management for Lynch syndrome and that we would offer testing to family members should she come back positive.  We reviewed the characteristics, features and inheritance patterns of hereditary cancer syndromes. We also discussed genetic testing, including the appropriate family members to test, the process of testing, insurance coverage and turn-around-time for results. We discussed the implications of a negative, positive and/or variant of uncertain significant result. We recommended Ms. Dagher pursue genetic testing for the pancreatic cancer panel gene panel. The pancreatic cancer gene panel offered by [facility] and includes sequencing and rearrangement analysis for the following 14 genes: APC, ATM, BRCA1, BRCA2, CDK4, CDKN2A, EPCAM, MLH1, MSH2, MSH6, PALB2, PMS2, STK11, and TP53.     Based on Ms. Seely's personal and family history of cancer, she meets medical criteria for genetic testing. Despite that she meets criteria, she  may still have an out of pocket cost. We discussed that if her out of pocket cost for testing is over $100, the laboratory will call and confirm whether she wants to proceed with testing.  If the out of pocket cost of testing is less than $100 she will be billed by the genetic testing laboratory.   PLAN: After considering the risks, benefits, and limitations, Ms. Arntz  provided informed consent to pursue genetic testing and the blood sample was sent to [facility] for analysis of the Pancreatic cancer panel. Results should be available within approximately 2-3 weeks' time, at which point they will be disclosed by telephone to Ms. Dols, as will any additional recommendations warranted by these results. Ms. Tyer will receive a summary of her genetic counseling visit and a copy of her results once available. This information will also be available in Epic. We encouraged Ms. Pastrana to remain in contact with cancer genetics annually so that we can continuously update the family history and inform her of any changes in cancer genetics and testing that may be of benefit for her family. Ms. Boxley's questions were answered to her satisfaction today. Our contact information was provided should additional questions or concerns arise.  Lastly, we encouraged Ms. Heidelberger to remain in contact with cancer genetics annually so that we can continuously update the family history and inform her of any changes in cancer genetics and testing that may be of benefit for this family.   Ms.  Carlucci's questions were answered to her satisfaction today. Our contact information was provided should additional questions or concerns arise. Thank you for the referral and allowing [place] to share in the care of your patient.   Shawnay P. Florene Glen, [place], [facility] Certified Genetic Counselor [email or web address] phone: [phone]  The patient was seen for a total of 45 minutes in face-to-face genetic counseling.  This patient was discussed with Drs.  Magrinat, Lindi Adie and/or Burr Medico who agrees with the above.    _______________________________________________________________________ For Office Staff:  Number of people involved in session: 2 Was an Intern/ student involved with case: yes

## 2015-05-05 ENCOUNTER — Telehealth: Payer: Self-pay | Admitting: Medical Oncology

## 2015-05-05 ENCOUNTER — Other Ambulatory Visit: Payer: Medicare PPO

## 2015-05-05 ENCOUNTER — Telehealth: Payer: Self-pay | Admitting: *Deleted

## 2015-05-05 NOTE — Telephone Encounter (Signed)
Pt reports for the past 2 days she has a burning in her stomach off and on . This burning last 2 hours. She is nauseated without vomiting, having a  normal BM  followed by diarrhea x 3 days . Mouth and  lips sticking together, she is "very thirsty".  She ate peaches , yogurt and  oyster crackers today . Has not had a fever like she usually does in evening. Takes Prevacid daily and taking zofran. Note to [facility].

## 2015-05-05 NOTE — Progress Notes (Signed)
Judy Cummings  HEMATOLOGY ONCOLOGY PROGRESS NOTE  Date of service 04/27/2015  Patient Care Team: Shirline Frees, MD as PCP - General (Family Medicine) Brunetta Genera, MD as Consulting Physician (Hematology)  SUMMARY OF ONCOLOGIC HISTORY:   Pancreatic carcinoma metastatic to liver   02/15/2015 Imaging CT Abd IMPRESSION: 1. Widespread hepatic metastatic disease. No underlying morphologic changes of cirrhosis are demonstrated to suggest multifocal hepatocellular carcinoma. 2. No primary malignancy identified within the abdomen. The pelvis was not imaged.    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 Widespread neoplastic disease throughout the liver and lymph nodes of the upper abdomen. 2. No definite extrahepatic primary malignancy identified. 3. Potential early nodal metastases within the right internal mammary, subcarinal and left hilar station   03/07/2015 Initial Biopsy Preliminary results of liver lesion biopsy as per discussion with pathology Dr Donato Heinz consistent with metastatic pancreatic carcinoma. Final results pending additional IHC stains.   03/10/2015 -  Chemotherapy Started on Gemcitabine +  Abraxane chemotherapy cycle 1   03/17/2015 -  Chemotherapy Cycle 1 day 8. Abraxane held due to elevated LFTs likely related to excessive acetaminophen intake. Received gemcitabine.   04/01/2015 -  Chemotherapy Cycle 2 day 1. Dose reduce gemcitabine directing her milligrams per meter square and Abraxane to 100 mg/m    INTERVAL HISTORY:  [name] is here for her scheduled followup. She notes that her leg swelling is down.  The IV albumin appears to have helped with this.  She did go for her ultrasound guided paracentesis appointment but apparently much fluid was not noted to safely aspirate especially given her anti platelet therapy. Notes some low-grade tumor fevers.  No other acute new focal symptoms.  Pain  is well controlled.  She is sleeping well.  He speaks fondly about her children.  Husband [name] by her side.   She is keen to get the genetic testing done and scheduled for 05/04/2015.  REVIEW OF SYSTEMS:   10 point review of systems was done and is negative except as noted above.  I have reviewed the past medical history, past surgical history, social history and family history with the patient and they are unchanged from previous note.  ALLERGIES:  is allergic to boniva; delsym; erythromycin; fosamax; glucosamine forte; guaifenesin & derivatives; lactose intolerance (gi); nexium; tegretol; and zostavax.  MEDICATIONS:  Current Outpatient Prescriptions  Medication Sig Dispense Refill  . AMBULATORY NON FORMULARY MEDICATION Take 90 mg by mouth 2 (two) times daily. Medication Name: Brilinta 90 mg BID provided by [facility] study (Do Not Fill)    . AMBULATORY NON FORMULARY MEDICATION Take 81 mg by mouth daily. Medication Name: ASA 81 mg daily Provided by [facility] study    . Azelaic Acid 15 % cream Apply 1 application topically daily as needed (for acne and rosacea).     Judy Cummings dexamethasone (DECADRON) 2 MG tablet Take 1 tablet (2 mg total) by mouth 2 (two) times daily with a meal. (Patient taking differently: Take 2 mg by mouth 2 (two) times daily with a meal. Take for 3 days after chemo treatment.) 30 tablet 0  . feeding supplement (BOOST / RESOURCE BREEZE) LIQD Take 1 Container by mouth daily.  0  . fluticasone (FLONASE) 50 MCG/ACT nasal spray Place 1 spray into both nostrils daily as needed for allergies.     Judy Cummings ibuprofen (ADVIL,MOTRIN) 200 MG tablet Take 200 mg by mouth every  6 (six) hours as needed.    . lactase (LACTAID) 3000 UNITS tablet Take 1 tablet by mouth daily as needed (for lactose intolerance).    . lansoprazole (PREVACID) 30 MG capsule Take 1 capsule (30 mg total) by mouth daily before breakfast. 60 capsule 0  . LORazepam (ATIVAN) 0.5 MG tablet Take 1-2 tablets (0.5-1 mg total) by  mouth every 8 (eight) hours. (Patient taking differently: Take 0.25 mg by mouth every 8 (eight) hours. ) 50 tablet 0  . losartan (COZAAR) 25 MG tablet Take 1 tablet (25 mg total) by mouth daily. 30 tablet 6  . metoprolol tartrate (LOPRESSOR) 25 MG tablet Take 0.5 tablets (12.5 mg total) by mouth 2 (two) times daily. 60 tablet 4  . ondansetron (ZOFRAN) 8 MG tablet Take 1 tablet (8 mg total) by mouth every 8 (eight) hours as needed for nausea or vomiting. 30 tablet 3  . oxyCODONE (OXY IR/ROXICODONE) 5 MG immediate release tablet Take 1 tablet (5 mg total) by mouth every 4 (four) hours as needed for severe pain. (Patient taking differently: Take 5 mg by mouth every 4 (four) hours as needed for severe pain (for cough). ) 60 tablet 0  . PRESCRIPTION MEDICATION Chemo [facility]    . prochlorperazine (COMPAZINE) 10 MG tablet Take 1 tablet (10 mg total) by mouth every 8 (eight) hours as needed for refractory nausea / vomiting (if nausea not responding to zofran). 30 tablet 0  . senna-docusate (SENNA S) 8.6-50 MG per tablet Take 2 tablets by mouth at bedtime. May increase to 2 tab twice daily if constipation persistent 60 tablet 1  . spironolactone (ALDACTONE) 25 MG tablet Take 1 tablet (25 mg total) by mouth daily. 60 tablet 0  . TEGRETOL-XR 200 MG 12 hr tablet Take 1 tablet (200 mg total) by mouth 2 (two) times daily. 60 tablet 2  . vancomycin (VANCOCIN) 125 MG capsule Take 1 capsule (125 mg total) by mouth 4 (four) times daily. For 10 days 40 capsule 0   No current facility-administered medications for this visit.    PHYSICAL EXAMINATION: ECOG PERFORMANCE STATUS: 1 - Symptomatic but completely ambulatory  Filed Vitals:   04/27/15 1021  BP: 162/71  Pulse: 78  Temp: 99.1 F (37.3 C)  Resp: 18   Filed Weights   04/27/15 1021  Weight: 115 lb 3.2 oz (52.254 kg)   GENERAL: Alert, no distress and comfortable SKIN: skin color, texture, turgor are normal, no rashes or significant lesions EYES: normal,  Conjunctiva clear, sclera clear OROPHARYNX: no exudate, no erythema and lips, buccal mucosa, and tongue normal  NECK: supple, thyroid normal size, non-tender, without nodularity LYMPH:  no palpable lymphadenopathy in the cervical, axillary or inguinal LUNGS: clear to auscultation and percussion with normal breathing effort HEART: regular rate & rhythm and no murmurs  ABDOMEN:abdomen soft, somewhat distended with hepatomegaly no palpable splenomegaly Musculoskeletal:bilateral 1-2+ pitting pedal edema NEURO: alert & oriented x 3 with fluent speech, no focal motor/sensory deficits  LABORATORY DATA:    . CBC Latest Ref Rng 05/04/2015 04/27/2015 04/14/2015  WBC 3.9 - 10.3 10e3/uL 11.2(H) 13.8(H) 6.0  Hemoglobin 11.6 - 15.9 g/dL 10.5(L) 10.7(L) 10.2(L)  Hematocrit 34.8 - 46.6 % 31.7(L) 32.3(L) 30.7(L)  Platelets 145 - 400 10e3/uL 250 215 241     CMP Latest Ref Rng 05/04/2015 04/27/2015 04/14/2015  Glucose 70 - 140 mg/dl 129 127 139  BUN 7.0 - 26.0 mg/dL 7.0 7.5 6.5(L)  Creatinine 0.6 - 1.1 mg/dL 0.6 0.6 0.7  Sodium 136 -  145 mEq/L 134(L) 136 135(L)  Potassium 3.5 - 5.1 mEq/L 4.1 3.1(L) 3.9  Chloride 101 - 111 mmol/L - - -  CO2 22 - 29 mEq/L 27 28 26   Calcium 8.4 - 10.4 mg/dL 9.2 9.2 9.2  Total Protein 6.4 - 8.3 g/dL 6.6 6.5 6.2(L)  Total Bilirubin 0.20 - 1.20 mg/dL 1.44(H) 1.62(H) 0.75  Alkaline Phos 40 - 150 U/L 375(H) 325(H) 248(H)  AST 5 - 34 U/L 71(H) 87(H) 64(H)  ALT 0 - 55 U/L 31 37 49    Lab Results  Component Value Date   WBC 11.2* 05/04/2015   NEUTROABS 8.3* 05/04/2015   HGB 10.5* 05/04/2015   HCT 31.7* 05/04/2015   MCV 103.5* 05/04/2015   PLT 250 05/04/2015     RADIOGRAPHIC STUDIES: Ultrasound of bilateral lower extremities done 03/25/2015: no evidence of DVTs.  ASSESSMENT & PLAN:   1) Pancreatic carcinoma metastatic to the liver.  Patient has significant liver involvement with metastases involving perhaps 50 percent or more of her liver. ECOG performance status  1-2 .   Pancreatic carcinoma metastatic to liver   02/15/2015 Imaging CT Abd IMPRESSION: 1. Widespread hepatic metastatic disease. No underlying morphologic changes of cirrhosis are demonstrated to suggest multifocal hepatocellular carcinoma. 2. No primary malignancy identified within the abdomen. The pelvis was not imaged.    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 Widespread neoplastic disease throughout the liver and lymph nodes of the upper abdomen. 2. No definite extrahepatic primary malignancy identified. 3. Potential early nodal metastases within the right internal mammary, subcarinal and left hilar station   03/07/2015 Initial Biopsy Preliminary results of liver lesion biopsy as per discussion with pathology Dr Donato Heinz consistent with metastatic pancreatic carcinoma. Final results pending additional IHC stains.   03/10/2015 -  Chemotherapy Started on Gemcitabine +  Abraxane chemotherapy cycle 1   03/17/2015 -  Chemotherapy Cycle 1 day 8. Abraxane held due to elevated LFTs likely related to excessive acetaminophen intake. Received gemcitabine.   04/01/2015 -  Chemotherapy Cycle 2 day 1. Dose reduce gemcitabine directing her milligrams per meter square and Abraxane to 100 mg/m   Plan -discussed option of officially enrolling into hospice.  Patient is already being followed by palliative care services. -She wants to continue following with me at this time for symptomatic management. -Appears to still be enjoying a good quality of life which we want to try to support her as long as possible. -We will discontinue standing lab orders and chemotherapy treatment plan. -Patient will follow up with genetic counseling on 05/04/2015 as per plan.  return to care in 2 weeks with me for repeat assessment.  Earlier if any new concerns. -all the comfort medications reviewed   2) non-neutropenic low-grade fever -  This is likely tumor fever.  Patient has no focal symptoms suggestive of an alternative source of infection.  Has developed some abdominal distention with likely ascites cannot rule out the possibility of low-grade SBP Plan  -as needed twice a day naproxen for tumor fever -cannot use dexamethasone due to issues with leg swelling -We'll get a CT chest abdomen pelvis and the patient has significant ascites will consider tapping it to rule out SBP.  Return to care with Dr. Irene Limbo on 05/11/2015 for ongoing cares .  All questions were answered. The patient knows to call the clinic with any problems, questions or concerns.  I spent 20 minutes  counseling the patient face to face. The total time spent in the appointment was 25 minutes and more than 50% was on counseling and review of test results   Sullivan Lone MD [place]  (Office):       [phone] (Work cell):  [phone] (Fax):           [phone]

## 2015-05-05 NOTE — Telephone Encounter (Signed)
Returned pt call regarding nausea, burning of stomach, and dry mouth.  Pt states she has a "very bad taste" in mouth and very dry mouth.  Pt stated she is drinking enough water, urinating frequently, urine light in color,  "I will hold off on IV fluids for now".  Per Dr. Irene Limbo, pt could pick up rx for GI cocktail to help with burning "I will hold off on that too".  Pt stated she was taking prescribed antiemetics.  Informed pt she could try biotene over the counter for dry mouth, pt stated she has tried that with no results.  Pt will wait to see Dr. Irene Limbo until scheduled visit on 9/28.  Instructed to call if symptoms worsen.  Pt verbalized understanding.

## 2015-05-10 ENCOUNTER — Other Ambulatory Visit: Payer: Self-pay | Admitting: *Deleted

## 2015-05-10 MED ORDER — AMBULATORY NON FORMULARY MEDICATION: 81.0000 mg | Freq: Every day | Status: AC

## 2015-05-11 ENCOUNTER — Ambulatory Visit (HOSPITAL_BASED_OUTPATIENT_CLINIC_OR_DEPARTMENT_OTHER): Payer: Medicare PPO | Admitting: Hematology

## 2015-05-11 ENCOUNTER — Telehealth: Payer: Self-pay | Admitting: Hematology

## 2015-05-11 ENCOUNTER — Encounter: Payer: Self-pay | Admitting: Hematology

## 2015-05-11 VITALS — BP 148/55 | HR 73 | Temp 97.5°F | Resp 18 | Ht 63.0 in | Wt 112.5 lb

## 2015-05-11 DIAGNOSIS — C259 Malignant neoplasm of pancreas, unspecified: Secondary | ICD-10-CM | POA: Diagnosis not present

## 2015-05-11 DIAGNOSIS — C787 Secondary malignant neoplasm of liver and intrahepatic bile duct: Secondary | ICD-10-CM | POA: Diagnosis not present

## 2015-05-11 DIAGNOSIS — R188 Other ascites: Secondary | ICD-10-CM

## 2015-05-11 DIAGNOSIS — R509 Fever, unspecified: Secondary | ICD-10-CM

## 2015-05-11 NOTE — Telephone Encounter (Signed)
per pof to sch pt appt-gave pt copy of avs °

## 2015-05-12 ENCOUNTER — Other Ambulatory Visit: Payer: Medicare PPO

## 2015-05-12 ENCOUNTER — Ambulatory Visit: Payer: Medicare PPO

## 2015-05-12 ENCOUNTER — Encounter: Payer: Medicare PPO | Admitting: Nutrition

## 2015-05-12 NOTE — Progress Notes (Signed)
[name]  HEMATOLOGY ONCOLOGY PROGRESS NOTE  Date of service 05/11/2015  Patient Care Team: Shirline Frees, MD as PCP - General (Family Medicine) Brunetta Genera, MD as Consulting Physician (Hematology)  SUMMARY OF ONCOLOGIC HISTORY:   Pancreatic carcinoma metastatic to liver   02/15/2015 Imaging CT Abd IMPRESSION: 1. Widespread hepatic metastatic disease. No underlying morphologic changes of cirrhosis are demonstrated to suggest multifocal hepatocellular carcinoma. 2. No primary malignancy identified within the abdomen. The pelvis was not imaged.    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 Widespread neoplastic disease throughout the liver and lymph nodes of the upper abdomen. 2. No definite extrahepatic primary malignancy identified. 3. Potential early nodal metastases within the right internal mammary, subcarinal and left hilar station   03/07/2015 Initial Biopsy Preliminary results of liver lesion biopsy as per discussion with pathology Dr Donato Heinz consistent with metastatic pancreatic carcinoma. Final results pending additional IHC stains.   03/10/2015 -  Chemotherapy Started on Gemcitabine +  Abraxane chemotherapy cycle 1   03/17/2015 -  Chemotherapy Cycle 1 day 8. Abraxane held due to elevated LFTs likely related to excessive acetaminophen intake. Received gemcitabine.   04/01/2015 -  Chemotherapy Cycle 2 day 1. Dose reduce gemcitabine directing her milligrams per meter square and Abraxane to 100 mg/m    INTERVAL HISTORY:  [name] is here for her scheduled followup. She notes that her tumor fevers are broken about 5 days ago and that is making her feel a little better. Some abdominal distention but not significantly worse. Lower extremity swelling is down. Able to maintain reasonable oral intake at this time. Intermittent nausea that is controlled with Zofran and Compazine. Does have Ativan as  needed for nausea as well. Tearing and discomfort in the eyes better with artificial tear eye drops and gel. Has been trying to meet some family. She is looking forward to seeing the fall colors. She notes that she has been making her funeral arrangement. She looks forward to visiting [place] for the Sunday service.  Did spend some time with her mother in her nursing home   REVIEW OF SYSTEMS:   10 point review of systems was done and is negative except as noted above.  I have reviewed the past medical history, past surgical history, social history and family history with the patient and they are unchanged from previous note.  ALLERGIES:  is allergic to boniva; delsym; erythromycin; fosamax; glucosamine forte; guaifenesin & derivatives; lactose intolerance (gi); nexium; tegretol; and zostavax.  MEDICATIONS:  Current Outpatient Prescriptions  Medication Sig Dispense Refill  . AMBULATORY NON FORMULARY MEDICATION Take 90 mg by mouth 2 (two) times daily. Medication Name: Brilinta 90 mg BID provided by [facility] Research study (Do Not Fill)    . AMBULATORY NON FORMULARY MEDICATION Take 81 mg by mouth daily. Medication Name: ASA 81 mg Daily or PLACEBO ([facility] Study PROVIDED)    . Azelaic Acid 15 % cream Apply 1 application topically daily as needed (for acne and rosacea).     [name] dexamethasone (DECADRON) 2 MG tablet Take 1 tablet (2 mg total) by mouth 2 (two) times daily with a meal. (Patient taking differently: Take 2 mg by mouth 2 (two) times daily with a meal. Take for 3 days after chemo treatment.) 30 tablet 0  . feeding supplement (BOOST / RESOURCE BREEZE) LIQD Take 1 Container by mouth daily.  0  . fluticasone (FLONASE) 50  MCG/ACT nasal spray Place 1 spray into both nostrils daily as needed for allergies.     [name] ibuprofen (ADVIL,MOTRIN) 200 MG tablet Take 200 mg by mouth every 6 (six) hours as needed.    . lactase (LACTAID) 3000 UNITS tablet Take 1 tablet by mouth daily as needed (for lactose  intolerance).    . lansoprazole (PREVACID) 30 MG capsule Take 1 capsule (30 mg total) by mouth daily before breakfast. 60 capsule 0  . LORazepam (ATIVAN) 0.5 MG tablet Take 1-2 tablets (0.5-1 mg total) by mouth every 8 (eight) hours. (Patient taking differently: Take 0.25 mg by mouth every 8 (eight) hours. ) 50 tablet 0  . losartan (COZAAR) 25 MG tablet Take 1 tablet (25 mg total) by mouth daily. 30 tablet 6  . metoprolol tartrate (LOPRESSOR) 25 MG tablet Take 0.5 tablets (12.5 mg total) by mouth 2 (two) times daily. 60 tablet 4  . ondansetron (ZOFRAN) 8 MG tablet Take 1 tablet (8 mg total) by mouth every 8 (eight) hours as needed for nausea or vomiting. 30 tablet 3  . oxyCODONE (OXY IR/ROXICODONE) 5 MG immediate release tablet Take 1 tablet (5 mg total) by mouth every 4 (four) hours as needed for severe pain. (Patient taking differently: Take 5 mg by mouth every 4 (four) hours as needed for severe pain (for cough). ) 60 tablet 0  . PRESCRIPTION MEDICATION Chemo [facility]    . prochlorperazine (COMPAZINE) 10 MG tablet Take 1 tablet (10 mg total) by mouth every 8 (eight) hours as needed for refractory nausea / vomiting (if nausea not responding to zofran). 30 tablet 0  . senna-docusate (SENNA S) 8.6-50 MG per tablet Take 2 tablets by mouth at bedtime. May increase to 2 tab twice daily if constipation persistent 60 tablet 1  . spironolactone (ALDACTONE) 25 MG tablet Take 1 tablet (25 mg total) by mouth daily. 60 tablet 0  . TEGRETOL-XR 200 MG 12 hr tablet Take 1 tablet (200 mg total) by mouth 2 (two) times daily. 60 tablet 2  . vancomycin (VANCOCIN) 125 MG capsule Take 1 capsule (125 mg total) by mouth 4 (four) times daily. For 10 days 40 capsule 0   No current facility-administered medications for this visit.    PHYSICAL EXAMINATION: ECOG PERFORMANCE STATUS: 2  Filed Vitals:   05/11/15 1022  BP: 148/55  Pulse: 73  Temp: 97.5 F (36.4 C)  Resp: 18   Filed Weights   05/11/15 1022  Weight: 112  lb 8 oz (51.03 kg)   GENERAL: Alert, no distress and comfortable SKIN: skin color, texture, turgor are normal, no rashes or significant lesions EYES: normal, Conjunctiva clear, sclera clear OROPHARYNX: no exudate, no erythema and lips, buccal mucosa, and tongue normal  NECK: supple, thyroid normal size, non-tender, without nodularity LYMPH:  no palpable lymphadenopathy in the cervical, axillary or inguinal LUNGS: clear to auscultation and percussion with normal breathing effort HEART: regular rate & rhythm and no murmurs  ABDOMEN:abdomen soft, somewhat distended with hepatomegaly no palpable splenomegaly Musculoskeletal:bilateral 1-2+ pitting pedal edema NEURO: alert & oriented x 3 with fluent speech, no focal motor/sensory deficits  LABORATORY DATA:    . CBC Latest Ref Rng 05/04/2015 04/27/2015 04/14/2015  WBC 3.9 - 10.3 10e3/uL 11.2(H) 13.8(H) 6.0  Hemoglobin 11.6 - 15.9 g/dL 10.5(L) 10.7(L) 10.2(L)  Hematocrit 34.8 - 46.6 % 31.7(L) 32.3(L) 30.7(L)  Platelets 145 - 400 10e3/uL 250 215 241     CMP Latest Ref Rng 05/04/2015 04/27/2015 04/14/2015  Glucose 70 - 140  mg/dl 129 127 139  BUN 7.0 - 26.0 mg/dL 7.0 7.5 6.5(L)  Creatinine 0.6 - 1.1 mg/dL 0.6 0.6 0.7  Sodium 136 - 145 mEq/L 134(L) 136 135(L)  Potassium 3.5 - 5.1 mEq/L 4.1 3.1(L) 3.9  Chloride 101 - 111 mmol/L - - -  CO2 22 - 29 mEq/L 27 28 26   Calcium 8.4 - 10.4 mg/dL 9.2 9.2 9.2  Total Protein 6.4 - 8.3 g/dL 6.6 6.5 6.2(L)  Total Bilirubin 0.20 - 1.20 mg/dL 1.44(H) 1.62(H) 0.75  Alkaline Phos 40 - 150 U/L 375(H) 325(H) 248(H)  AST 5 - 34 U/L 71(H) 87(H) 64(H)  ALT 0 - 55 U/L 31 37 49    Lab Results  Component Value Date   WBC 11.2* 05/04/2015   NEUTROABS 8.3* 05/04/2015   HGB 10.5* 05/04/2015   HCT 31.7* 05/04/2015   MCV 103.5* 05/04/2015   PLT 250 05/04/2015     RADIOGRAPHIC STUDIES: Ultrasound of bilateral lower extremities done 03/25/2015: no evidence of DVTs.  ASSESSMENT & PLAN:   1) Pancreatic carcinoma  metastatic to the liver.  Patient has significant liver involvement with metastases involving perhaps 50 percent or more of her liver. ECOG performance status 1-2 .   Pancreatic carcinoma metastatic to liver   02/15/2015 Imaging CT Abd IMPRESSION: 1. Widespread hepatic metastatic disease. No underlying morphologic changes of cirrhosis are demonstrated to suggest multifocal hepatocellular carcinoma. 2. No primary malignancy identified within the abdomen. The pelvis was not imaged.    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 Widespread neoplastic disease throughout the liver and lymph nodes of the upper abdomen. 2. No definite extrahepatic primary malignancy identified. 3. Potential early nodal metastases within the right internal mammary, subcarinal and left hilar station   03/07/2015 Initial Biopsy Preliminary results of liver lesion biopsy as per discussion with pathology Dr Donato Heinz consistent with metastatic pancreatic carcinoma. Final results pending additional IHC stains.   03/10/2015 -  Chemotherapy Started on Gemcitabine +  Abraxane chemotherapy cycle 1   03/17/2015 -  Chemotherapy Cycle 1 day 8. Abraxane held due to elevated LFTs likely related to excessive acetaminophen intake. Received gemcitabine.   04/01/2015 -  Chemotherapy Cycle 2 day 1. Dose reduce gemcitabine directing her milligrams per meter square and Abraxane to 100 mg/m   Plan -Appears to still be enjoying a good quality of life which we want to try to support her as long as possible. Her quality of life is certainly better now than while she was on chemotherapy. Tumor fevers have resolved for now. -Palliative care following. Offered official enrollment in hospice patient notes she will think about it and let me know.  -She has been using Zofran and Compazine which appeared to be controlling her nausea for now. -Limited oral intake but is  able to keep down 3 meals a day. -completed genetic counseling on 05/04/2015 -awaiting results. -No labs unless new symptoms. -all the comfort medications reviewed.  -return to care in 2 weeks with me for repeat assessment.  Earlier if any new concerns.   2) non-neutropenic low-grade fever - This is likely tumor fever.  Patient has no focal symptoms suggestive of an alternative source of infection.  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 Currently afebrile the last 4-5 days as per patient. Plan Naproxen twice a day when necessary for tumor fevers. We will need to reassess role of fluid removal if uncomfortable increasing ascites.  4) CAD - s/p PCI this year. On Brillant, BB and ARB --- continue.   Return to care with Dr. Irene Limbo in 2 weeks for continues comfort/palliative care and symptom management input All questions were answered. The patient knows to call the clinic with any problems, questions or concerns.  I spent 20 minutes counseling the patient face to face. The total time spent in the appointment was 25 minutes and more than 50% was on counseling and review of test results   Sullivan Lone MD [facility]  (Office):       [phone] (Work cell):  [phone] (Fax):           [phone]

## 2015-05-18 ENCOUNTER — Encounter: Payer: Self-pay | Admitting: Genetic Counselor

## 2015-05-18 DIAGNOSIS — Z1379 Encounter for other screening for genetic and chromosomal anomalies: Secondary | ICD-10-CM | POA: Insufficient documentation

## 2015-05-20 ENCOUNTER — Encounter: Payer: Self-pay | Admitting: *Deleted

## 2015-05-20 ENCOUNTER — Telehealth: Payer: Self-pay | Admitting: Genetic Counselor

## 2015-05-20 NOTE — Telephone Encounter (Signed)
LM on VM with good news on genetic test results. 

## 2015-05-25 ENCOUNTER — Telehealth: Payer: Self-pay | Admitting: Hematology

## 2015-05-25 ENCOUNTER — Ambulatory Visit (HOSPITAL_BASED_OUTPATIENT_CLINIC_OR_DEPARTMENT_OTHER): Payer: Medicare PPO | Admitting: Hematology

## 2015-05-25 ENCOUNTER — Ambulatory Visit: Payer: Self-pay | Admitting: Genetic Counselor

## 2015-05-25 ENCOUNTER — Encounter: Payer: Self-pay | Admitting: Hematology

## 2015-05-25 ENCOUNTER — Encounter: Payer: Self-pay | Admitting: Genetic Counselor

## 2015-05-25 VITALS — BP 155/70 | HR 73 | Temp 97.5°F | Resp 18 | Ht 63.0 in | Wt 115.3 lb

## 2015-05-25 DIAGNOSIS — Z1379 Encounter for other screening for genetic and chromosomal anomalies: Secondary | ICD-10-CM

## 2015-05-25 DIAGNOSIS — C259 Malignant neoplasm of pancreas, unspecified: Secondary | ICD-10-CM | POA: Diagnosis not present

## 2015-05-25 DIAGNOSIS — Z803 Family history of malignant neoplasm of breast: Secondary | ICD-10-CM

## 2015-05-25 DIAGNOSIS — Z800 Family history of malignant neoplasm of digestive organs: Secondary | ICD-10-CM

## 2015-05-25 DIAGNOSIS — C787 Secondary malignant neoplasm of liver and intrahepatic bile duct: Secondary | ICD-10-CM

## 2015-05-25 MED ORDER — LORAZEPAM 0.5 MG PO TABS: 0.5000 mg | ORAL_TABLET | Freq: Three times a day (TID) | ORAL | Status: DC

## 2015-05-25 MED ORDER — LANSOPRAZOLE 30 MG PO CPDR: 30.0000 mg | DELAYED_RELEASE_CAPSULE | Freq: Every day | ORAL | Status: DC

## 2015-05-25 NOTE — Progress Notes (Signed)
Judy Cummings and Judy Cummings stopped by my office and I revealed that she had negative genetic testing on the pancreatic cancer panel.  I answered questions about the denial of services they received from their insurance, and provided them the phone number to [facility] to learn more about their OOP cost for testing.  Discussed that family members could consider research programs that screen for pancreatic cancer even though this test was negative.

## 2015-05-25 NOTE — Progress Notes (Signed)
HPI: Ms. Fite was previously seen in the [place] clinic due to a personal and family history of cancer and concerns regarding a hereditary predisposition to cancer. Please refer to our prior cancer genetics clinic note for more information regarding Ms. Ferrando's medical, social and family histories, and our assessment and recommendations, at the time. Ms. Uhrich's recent genetic test results were disclosed to her, as were recommendations warranted by these results. These results and recommendations are discussed in more detail below.  FAMILY HISTORY:  We obtained a detailed, 4-generation family history.  Significant diagnoses are listed below: Family History  Problem Relation Age of Onset  . Diabetes Mellitus I Mother   . Hypertension Mother   . Atrial fibrillation Mother   . Heart attack Father   . CVA Father   . CAD Father   . Hyperlipidemia Sister   . Heart attack Maternal Grandfather   . CVA Paternal Grandmother   . Colon cancer Paternal Grandfather 16  . Pancreatic cancer Paternal Uncle 61  . Colon cancer Other     PGF's brother  . Breast cancer Other     PGFs sister    The patient has two children and one sister who are all cancer free. Her mother is alive at 58, but is in palliative care. Her father died of a stroke at 62. He had two sisters and one brother. The brother was diagnosed and died of pancreatic cancer at 78. Her paternal grandmother died of a stroke, and her grandfather died of colon cancer at age 73. Her grandfathers brother had colon cancer and his sister had breast cancer. Her mother had 9 siblings, none of whom had cancer. There is no other reported family history of cancer. Patient's maternal ancestors are of Caucasian descent, and paternal ancestors are of [place] descent. There is no reported Ashkenazi Jewish ancestry. There is no known consanguinity.  GENETIC TEST RESULTS: At the time of Ms. Sobol's visit, we recommended she pursue genetic  testing of the pancreatic cancer gene panel. The Pancreatic Cancer gene panel offered by [facility] and includes sequencing and rearrangement analysis for the following 12 genes: APC, ATM, BRCA1, BRCA2, CDK4, CDKN2A, EPCAM, MLH1, MSH2, MSH6, PALB2, PMS2, STK11, TP53, VHL, and XRCC2.   The report date is May 17, 2015.  Genetic testing was normal, and did not reveal a deleterious mutation in these genes. The test report has been scanned into EPIC and is located under the Molecular Pathology section of the Results Review tab.   We discussed with Ms. Qualley that since the current genetic testing is not perfect, it is possible there may be a gene mutation in one of these genes that current testing cannot detect, but that chance is small. We also discussed, that it is possible that another gene that has not yet been discovered, or that we have not yet tested, is responsible for the cancer diagnoses in the family, and it is, therefore, important to remain in touch with cancer genetics in the future so that we can continue to offer Ms. Fretz the most up to date genetic testing.   CANCER SCREENING RECOMMENDATIONS: This result is reassuring and indicates that Ms. Stroupe likely does not have an increased risk for a future cancer due to a mutation in one of these genes. This normal test also suggests that Ms. Korol's cancer was most likely not due to an inherited predisposition associated with one of these genes.  Most cancers happen by chance and this negative  test suggests that her cancer falls into this category.  We, therefore, recommended she continue to follow the cancer management and screening guidelines provided by her oncology and primary healthcare provider.   RECOMMENDATIONS FOR FAMILY MEMBERS: Women in this family might be at some increased risk of developing cancer, over the general population risk, simply due to the family history of cancer. We recommended women in this family have a yearly mammogram beginning at  age 69, or 74 years younger than the earliest onset of cancer, an an annual clinical breast exam, and perform monthly breast self-exams. Women in this family should also have a gynecological exam as recommended by their primary provider. All family members should have a colonoscopy by age 53.  FOLLOW-UP: Lastly, we discussed with Ms. Bischoff that cancer genetics is a rapidly advancing field and it is possible that new genetic tests will be appropriate for her and/or her family members in the future. We encouraged her to remain in contact with cancer genetics on an annual basis so we can update her personal and family histories and let her know of advances in cancer genetics that may benefit this family.   Our contact number was provided. Ms. Kuhnle's questions were answered to her satisfaction, and she knows she is welcome to call us at anytime with additional questions or concerns.   Roma Kayser, MS, [facility] Certified Genetic Counselor [email or web address]

## 2015-05-25 NOTE — Telephone Encounter (Signed)
Gave and printed appt sched and avs fo rpt; for NOV  °

## 2015-05-28 NOTE — Progress Notes (Signed)
Judy Kitchen  HEMATOLOGY ONCOLOGY PROGRESS [facility]  Date of service 05/25/2015  Patient Care Team: Shirline Frees, MD as PCP - General (Family Medicine) Brunetta Genera, MD as Consulting Physician (Hematology)  SUMMARY OF ONCOLOGIC HISTORY:   Pancreatic carcinoma metastatic to liver [facility])   02/15/2015 Imaging CT Abd IMPRESSION: 1. Widespread hepatic metastatic disease. No underlying morphologic changes of cirrhosis are demonstrated to suggest multifocal hepatocellular carcinoma. 2. No primary malignancy identified within the abdomen. The pelvis was not imaged.    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 Widespread neoplastic disease throughout the liver and lymph nodes of the upper abdomen. 2. No definite extrahepatic primary malignancy identified. 3. Potential early nodal metastases within the right internal mammary, subcarinal and left hilar station   03/07/2015 Initial Biopsy Preliminary results of liver lesion biopsy as per discussion with pathology Dr Donato Heinz consistent with metastatic pancreatic carcinoma. Final results pending additional IHC stains.   03/10/2015 -  Chemotherapy Started on Gemcitabine +  Abraxane chemotherapy cycle 1   03/17/2015 -  Chemotherapy Cycle 1 day 8. Abraxane held due to elevated LFTs likely related to excessive acetaminophen intake. Received gemcitabine.   04/01/2015 -  Chemotherapy Cycle 2 day 1. Dose reduce gemcitabine directing her milligrams per meter square and Abraxane to 100 mg/m    INTERVAL HISTORY:  Judy Cummings is here for her scheduled followup for her pancreatic cancer along with her husband Judy Cummings. She notes that at her pain has been well-controlled. Still having some eye irritation for which she has been using artificial tears and no medicating ointment with good effect. Nausea under reasonable control. No significant increase in abdominal distention or lower  extremity swelling. Still some intermittent heartburns. She notes that she is at increased with her terminal condition. Has visited the church and met some of her friends. Has been eating as well as she can but hasn't had the best appetite. Her husband Judy Cummings providing exceptional emotional support. Notes that she had taken tizanidine given by her primary care physician for some back pains but it made her too sleepy. We talked about just using her current Ativan and not using the tizanidine.   REVIEW OF SYSTEMS:   10 point review of systems was done and is negative except as noted above.  I have reviewed the past medical history, past surgical history, social history and family history with the patient and they are unchanged from previous [facility].  ALLERGIES:  is allergic to boniva; delsym; erythromycin; fosamax; glucosamine forte; guaifenesin & derivatives; lactose intolerance (gi); nexium; tegretol; and zostavax.  MEDICATIONS:  Current Outpatient Prescriptions  Medication Sig Dispense Refill  . AMBULATORY NON FORMULARY MEDICATION Take 90 mg by mouth 2 (two) times daily. Medication Name: Brilinta 90 mg BID provided by [employer] study (Do Not Fill)    . AMBULATORY NON FORMULARY MEDICATION Take 81 mg by mouth daily. Medication Name: ASA 81 mg Daily or PLACEBO (TWILIGHT Study PROVIDED)    . Azelaic Acid 15 % cream Apply 1 application topically daily as needed (for acne and rosacea).     Judy Kitchen dexamethasone (DECADRON) 2 MG tablet Take 1 tablet (2 mg total) by mouth 2 (two) times daily with a meal. (Patient taking differently: Take 2 mg by mouth 2 (two) times daily with a meal. Take for 3 days after chemo treatment.) 30 tablet 0  . feeding supplement (BOOST / RESOURCE BREEZE) LIQD Take  1 Container by mouth daily.  0  . fluticasone (FLONASE) 50 MCG/ACT nasal spray Place 1 spray into both nostrils daily as needed for allergies.     Judy Kitchen ibuprofen (ADVIL,MOTRIN) 200 MG tablet Take 200 mg by mouth every 6  (six) hours as needed.    . lactase (LACTAID) 3000 UNITS tablet Take 1 tablet by mouth daily as needed (for lactose intolerance).    . lansoprazole (PREVACID) 30 MG capsule Take 1 capsule (30 mg total) by mouth daily before breakfast. 60 capsule 2  . LORazepam (ATIVAN) 0.5 MG tablet Take 1-2 tablets (0.5-1 mg total) by mouth every 8 (eight) hours. 50 tablet 0  . losartan (COZAAR) 25 MG tablet Take 1 tablet (25 mg total) by mouth daily. 30 tablet 6  . metoprolol tartrate (LOPRESSOR) 25 MG tablet Take 0.5 tablets (12.5 mg total) by mouth 2 (two) times daily. 60 tablet 4  . ondansetron (ZOFRAN) 8 MG tablet Take 1 tablet (8 mg total) by mouth every 8 (eight) hours as needed for nausea or vomiting. 30 tablet 3  . oxyCODONE (OXY IR/ROXICODONE) 5 MG immediate release tablet Take 1 tablet (5 mg total) by mouth every 4 (four) hours as needed for severe pain. (Patient taking differently: Take 5 mg by mouth every 4 (four) hours as needed for severe pain (for cough). ) 60 tablet 0  . PRESCRIPTION MEDICATION Chemo [facility]    . prochlorperazine (COMPAZINE) 10 MG tablet Take 1 tablet (10 mg total) by mouth every 8 (eight) hours as needed for refractory nausea / vomiting (if nausea not responding to zofran). 30 tablet 0  . senna-docusate (SENNA S) 8.6-50 MG per tablet Take 2 tablets by mouth at bedtime. May increase to 2 tab twice daily if constipation persistent 60 tablet 1  . TEGRETOL-XR 200 MG 12 hr tablet Take 1 tablet (200 mg total) by mouth 2 (two) times daily. 60 tablet 2   No current facility-administered medications for this visit.    PHYSICAL EXAMINATION: ECOG PERFORMANCE STATUS: 2  Filed Vitals:   05/25/15 1058  BP: 155/70  Pulse: 73  Temp: 97.5 F (36.4 C)  Resp: 18   Filed Weights   05/25/15 1058  Weight: 115 lb 4.8 oz (52.3 kg)   GENERAL: Alert, no distress and comfortable SKIN: skin color, texture, turgor are normal, no rashes or significant lesions EYES: normal, Conjunctiva clear,  sclera clear OROPHARYNX: no exudate, no erythema and lips, buccal mucosa, and tongue normal  NECK: supple, thyroid normal size, non-tender, without nodularity LYMPH:  no palpable lymphadenopathy in the cervical, axillary or inguinal LUNGS: clear to auscultation and percussion with normal breathing effort HEART: regular rate & rhythm and no murmurs  ABDOMEN:abdomen soft, somewhat distended with hepatomegaly no palpable splenomegaly Musculoskeletal:bilateral 1+ pitting pedal edema NEURO: alert & oriented x 3 with fluent speech, no focal motor/sensory deficits  LABORATORY DATA:    . CBC Latest Ref Rng 05/04/2015 04/27/2015 04/14/2015  WBC 3.9 - 10.3 10e3/uL 11.2(H) 13.8(H) 6.0  Hemoglobin 11.6 - 15.9 g/dL 10.5(L) 10.7(L) 10.2(L)  Hematocrit 34.8 - 46.6 % 31.7(L) 32.3(L) 30.7(L)  Platelets 145 - 400 10e3/uL 250 215 241     CMP Latest Ref Rng 05/04/2015 04/27/2015 04/14/2015  Glucose 70 - 140 mg/dl 129 127 139  BUN 7.0 - 26.0 mg/dL 7.0 7.5 6.5(L)  Creatinine 0.6 - 1.1 mg/dL 0.6 0.6 0.7  Sodium 136 - 145 mEq/L 134(L) 136 135(L)  Potassium 3.5 - 5.1 mEq/L 4.1 3.1(L) 3.9  Chloride 101 - 111  mmol/L - - -  CO2 22 - 29 mEq/L $Remove'27 28 26  'DgAqJxK$ Calcium 8.4 - 10.4 mg/dL 9.2 9.2 9.2  Total Protein 6.4 - 8.3 g/dL 6.6 6.5 6.2(L)  Total Bilirubin 0.20 - 1.20 mg/dL 1.44(H) 1.62(H) 0.75  Alkaline Phos 40 - 150 U/L 375(H) 325(H) 248(H)  AST 5 - 34 U/L 71(H) 87(H) 64(H)  ALT 0 - 55 U/L 31 37 49    Lab Results  Component Value Date   WBC 11.2* 05/04/2015   NEUTROABS 8.3* 05/04/2015   HGB 10.5* 05/04/2015   HCT 31.7* 05/04/2015   MCV 103.5* 05/04/2015   PLT 250 05/04/2015     RADIOGRAPHIC STUDIES: Ultrasound of bilateral lower extremities done 03/25/2015: no evidence of DVTs.  ASSESSMENT & PLAN:   1) Pancreatic carcinoma metastatic to the liver.  Patient has significant liver involvement with metastases involving perhaps 50 percent or more of her liver. ECOG performance status 1-2 .   Pancreatic  carcinoma metastatic to liver ([place])   02/15/2015 Imaging CT Abd IMPRESSION: 1. Widespread hepatic metastatic disease. No underlying morphologic changes of cirrhosis are demonstrated to suggest multifocal hepatocellular carcinoma. 2. No primary malignancy identified within the abdomen. The pelvis was not imaged.    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 Widespread neoplastic disease throughout the liver and lymph nodes of the upper abdomen. 2. No definite extrahepatic primary malignancy identified. 3. Potential early nodal metastases within the right internal mammary, subcarinal and left hilar station   03/07/2015 Initial Biopsy Preliminary results of liver lesion biopsy as per discussion with pathology Dr Donato Heinz consistent with metastatic pancreatic carcinoma. Final results pending additional IHC stains.   03/10/2015 -  Chemotherapy Started on Gemcitabine +  Abraxane chemotherapy cycle 1   03/17/2015 -  Chemotherapy Cycle 1 day 8. Abraxane held due to elevated LFTs likely related to excessive acetaminophen intake. Received gemcitabine.   04/01/2015 -  Chemotherapy Cycle 2 day 1. Dose reduce gemcitabine directing her milligrams per meter square and Abraxane to 100 mg/m   Plan -Pain well controlled -continue when necessary oxycodone -Continue current antinausea medications. -completed genetic counseling on 05/04/2015 -pancreatic cancer gene panel did not show the presence of any of the specific mutations on the panel. -No further tumor fevers. -Continue Ativan when necessary for insomnia, nausea and anxiety. -No labs unless new symptoms. -all the comfort medications reviewed.  -return to care in 2 weeks with me for repeat assessment.  Earlier if any new concerns.  2) Ascites - likely malignant and abnormal liver functions due to liver metastases. Abdominal distention predominantly from hepatomegaly.  Previous ultrasound did not show a large amount of ascites that could be tapped. No worsening of abdominal distention on visit today. No further fevers noted. Afebrile today.  Plan Naproxen twice a day when necessary for tumor fevers. Abdominal paracentesis when necessary of increasing abdominal distention. We'll consider adding lactulose if needed in case of signs of hepatic encephalopathy. Might need to reduce Tegretol if worsening liver function.  4) CAD - s/p PCI this year. On [place], BB and ARB --- continue.  5] dry eyes-continue artificial tears eyedrops during the day and artificial tears ointment at nighttime.   Return to care with Dr. Irene Limbo in 2 weeks for continues comfort/palliative care and symptom management input All questions were answered. The patient knows to call the clinic with any problems, questions or concerns.  I spent 15  minutes counseling the patient face to face. The total time spent in the appointment was 20 minutes and more than 50% was on counseling and review of test results   Sullivan Lone MD [facility]  (Office):       [phone] (Work cell):  [phone] (Fax):           [phone]

## 2015-06-01 ENCOUNTER — Telehealth: Payer: Self-pay | Admitting: *Deleted

## 2015-06-01 NOTE — Telephone Encounter (Signed)
  Oncology Nurse Navigator Documentation    Navigator Encounter Type: Telephone;3 month (06/01/15 1725): Left VM for patient to inquire how she was doing and if she has any needs/concerns we need to be aware of. Left my direct contact information for her to return call if she wishes.

## 2015-06-03 ENCOUNTER — Telehealth: Payer: Self-pay | Admitting: *Deleted

## 2015-06-03 NOTE — Telephone Encounter (Signed)
Returned pt phone call regarding RUQ pain.  Per Dr. Irene Limbo, pt can take 4mg  dexamethasone twice daily for up to 5 days.  Pt may also increase use of oxycodone as needed.  Pt verbalized understanding.  Instructed to call back if that does not help.

## 2015-06-06 ENCOUNTER — Telehealth: Payer: Self-pay | Admitting: *Deleted

## 2015-06-06 MED ORDER — SUCRALFATE 1 GM/10ML PO SUSP: 1.0000 g | Freq: Three times a day (TID) | ORAL | Status: DC

## 2015-06-06 NOTE — Telephone Encounter (Signed)
Call from patient asking for advice for stomach pain.  "I have pancreatic cancer that has metasticized to my liver.  I hurt all across my abdomen and it burns.  Started last Wednesday.  Dr. Grier Mitts nurse told me to take dexamethasone 4 mg twice a day for five days on Friday.  I've hardly used it.  I burn more now and have only taken two mg once a day and yesterday I cut back to half a pill once a day.  Why is it just for five days?  Should I continue taking this for five more days?  My face flared up and red like I've been skiing.  I'm worn out because I 'm so energized I couldn't sit still.  I can't sleep, it hurts to bend over.  I take the oxycodone at bedtime."  Admits to taking dexamethasone with food, takes prevacid every morning.  Advised she take dexamethasone with breakfast and lunch so it does not affect sleep but the other symptoms are normal side effects.  This nurse will notify Dr. Grier Mitts nurse of this call.  Patient's return number [phone].  Today's on-call provider will also be notified.

## 2015-06-06 NOTE — Telephone Encounter (Signed)
Patient called triage today with complaint of increased right upper quadrant and generalized abdominal discomfort.  She states that she has been taking the dexamethasone for better pain control per directions-but it has only slightly helped.  She states that she has been unable to sleep while taking the dexamethasone; has increased appetite for sweets.  She has also developed a red face; but denies any specific rash.  Patient states that she is only taking oxycodone 1 tablet at bedtime.  Patient states that she has no nausea or vomiting.  She also denies any constipation or diarrhea.  She states she is able to drink fluids with no difficulty.  She denies any recent fevers or chills.  After reviewing all complaints with Dr. Benay Spice on call-patient was advised to discontinue any further dexamethasone; and to increase her oxycodone to 1-2 tablets every 6 hours as needed.  Also, will prescribe Carafate due to patient's complaint of esophageal burning/heartburn discomfort.  Advised patient to call if she needs a refill of her oxycodone before the weekend; so she can pick up the prescription.  Patient was advised to call/return or go directly to the emergency department for any worsening symptoms whatsoever.

## 2015-06-07 ENCOUNTER — Telehealth: Payer: Self-pay | Admitting: *Deleted

## 2015-06-07 ENCOUNTER — Encounter: Payer: Self-pay | Admitting: *Deleted

## 2015-06-07 DIAGNOSIS — Z006 Encounter for examination for normal comparison and control in clinical research program: Secondary | ICD-10-CM

## 2015-06-07 NOTE — Telephone Encounter (Signed)
Patient called asking if she can take prevacid.  Has taken carafate suspension.  Consulted with pharmacy and learned patient should wait 30 minutes to an hour so other medicines will be absorbed.  Advised Judy Cummings to wait an hour.

## 2015-06-07 NOTE — Progress Notes (Addendum)
[employer] 4 month telephone follow up visit completed. Patient denies any adverse or bleeding events. Compliant to medication.

## 2015-06-08 ENCOUNTER — Telehealth: Payer: Self-pay | Admitting: *Deleted

## 2015-06-08 NOTE — Telephone Encounter (Signed)
Called [name].  Provided Dr. Alvy Bimler instructions and advice.  At this time [name] reports she has "taken a nap.  No more nausea, headache and no more BM's.  I try to drink two bottled water every AM, afternoon and PM.  When I receive IVF, it males me swell."  Congratulated her on good work but again advised she try Tums as suggested if this re-occurs.  Asked if she can take Maalox.  Advised Maalox has magnesium which will have an effect on her bowels.  No further questions.

## 2015-06-08 NOTE — Telephone Encounter (Signed)
She may want to take TUMS/calcium carbonate instead that may help with heartburn/nausea without risk of constipation Sometimes nausea can cause dehydration which in turn can cause headaches, especially if she has frequent BM If she cannot hydrate fully, we can order IVF and IV anti-emetics here

## 2015-06-08 NOTE — Telephone Encounter (Signed)
Patient called asking for advice "what to do for side effects of carafate suspension.  It has helped the burning of my stomach but I have had small soft bm's from 0400 to 0700 every 30 to 60 minutes.  I'm nauseated.  Took carafate at 0700, I took zofran at 1010 before I called.  No vomiting but close.  I feel better from nausea already.  I was told not to take imodium because this will dry me up more.  I have headache with nausea.  Return number [phone]."  Dr. Irene Limbo out of office this week so will notify on-call provider.

## 2015-06-15 ENCOUNTER — Telehealth: Payer: Self-pay | Admitting: Hematology

## 2015-06-15 ENCOUNTER — Ambulatory Visit (HOSPITAL_BASED_OUTPATIENT_CLINIC_OR_DEPARTMENT_OTHER): Payer: Medicare PPO | Admitting: Hematology

## 2015-06-15 ENCOUNTER — Encounter: Payer: Self-pay | Admitting: Hematology

## 2015-06-15 VITALS — BP 153/71 | HR 74 | Temp 98.6°F | Resp 17 | Ht 63.0 in | Wt 115.5 lb

## 2015-06-15 DIAGNOSIS — C787 Secondary malignant neoplasm of liver and intrahepatic bile duct: Secondary | ICD-10-CM | POA: Diagnosis not present

## 2015-06-15 DIAGNOSIS — E739 Lactose intolerance, unspecified: Secondary | ICD-10-CM | POA: Diagnosis not present

## 2015-06-15 DIAGNOSIS — G893 Neoplasm related pain (acute) (chronic): Secondary | ICD-10-CM | POA: Diagnosis not present

## 2015-06-15 DIAGNOSIS — C259 Malignant neoplasm of pancreas, unspecified: Secondary | ICD-10-CM

## 2015-06-15 MED ORDER — OXYCODONE HCL 5 MG PO TABS: 5.0000 mg | ORAL_TABLET | ORAL | Status: DC | PRN

## 2015-06-15 MED ORDER — LORAZEPAM 0.5 MG PO TABS: 0.5000 mg | ORAL_TABLET | Freq: Three times a day (TID) | ORAL | Status: AC

## 2015-06-15 MED ORDER — LACTASE 9000 UNITS PO CHEW: 9000.0000 [IU] | CHEWABLE_TABLET | Freq: Three times a day (TID) | ORAL | Status: DC | PRN

## 2015-06-15 NOTE — Telephone Encounter (Signed)
per pof to sch pt appt-gave pt copy of avs °

## 2015-06-16 ENCOUNTER — Other Ambulatory Visit: Payer: Self-pay | Admitting: Hematology

## 2015-06-20 ENCOUNTER — Telehealth: Payer: Self-pay | Admitting: *Deleted

## 2015-06-20 MED ORDER — LANSOPRAZOLE 30 MG PO CPDR: 30.0000 mg | DELAYED_RELEASE_CAPSULE | Freq: Every day | ORAL | Status: AC

## 2015-06-20 MED ORDER — LACTASE 9000 UNITS PO CHEW: 9000.0000 [IU] | CHEWABLE_TABLET | Freq: Three times a day (TID) | ORAL | Status: AC | PRN

## 2015-06-20 NOTE — Telephone Encounter (Signed)
Patient left voicemail requesting refill for lansoprazole.  This nurse call leaving message that [facility] nurse refilled and should be ready for pick up at this time.

## 2015-06-29 ENCOUNTER — Other Ambulatory Visit (HOSPITAL_BASED_OUTPATIENT_CLINIC_OR_DEPARTMENT_OTHER): Payer: Medicare PPO

## 2015-06-29 ENCOUNTER — Telehealth: Payer: Self-pay | Admitting: Hematology

## 2015-06-29 ENCOUNTER — Ambulatory Visit (HOSPITAL_BASED_OUTPATIENT_CLINIC_OR_DEPARTMENT_OTHER): Payer: Medicare PPO | Admitting: Hematology

## 2015-06-29 ENCOUNTER — Encounter: Payer: Self-pay | Admitting: Hematology

## 2015-06-29 VITALS — BP 143/76 | HR 78 | Temp 97.5°F | Resp 18

## 2015-06-29 DIAGNOSIS — K5900 Constipation, unspecified: Secondary | ICD-10-CM

## 2015-06-29 DIAGNOSIS — C259 Malignant neoplasm of pancreas, unspecified: Secondary | ICD-10-CM

## 2015-06-29 DIAGNOSIS — C787 Secondary malignant neoplasm of liver and intrahepatic bile duct: Secondary | ICD-10-CM

## 2015-06-29 DIAGNOSIS — G893 Neoplasm related pain (acute) (chronic): Secondary | ICD-10-CM

## 2015-06-29 DIAGNOSIS — R188 Other ascites: Secondary | ICD-10-CM

## 2015-06-29 LAB — COMPREHENSIVE METABOLIC PANEL (CC13)
ALT: 24 U/L (ref 0–55)
AST: 73 U/L — AB (ref 5–34)
Albumin: 2.5 g/dL — ABNORMAL LOW (ref 3.5–5.0)
Alkaline Phosphatase: 312 U/L — ABNORMAL HIGH (ref 40–150)
Anion Gap: 9 mEq/L (ref 3–11)
BUN: 9.8 mg/dL (ref 7.0–26.0)
CHLORIDE: 103 meq/L (ref 98–109)
CO2: 21 meq/L — AB (ref 22–29)
CREATININE: 0.6 mg/dL (ref 0.6–1.1)
Calcium: 10.7 mg/dL — ABNORMAL HIGH (ref 8.4–10.4)
EGFR: >90 mL/min/{1.73_m2} (ref >90)
GLUCOSE: 113 mg/dL (ref 70–140)
POTASSIUM: 4.4 meq/L (ref 3.5–5.1)
SODIUM: 132 meq/L — AB (ref 136–145)
Total Bilirubin: 1.02 mg/dL (ref 0.20–1.20)
Total Protein: 6.8 g/dL (ref 6.4–8.3)

## 2015-06-29 LAB — CBC & DIFF AND RETIC
BASO%: 0.4 % (ref 0.0–2.0)
BASOS ABS: 0 10*3/uL (ref 0.0–0.1)
EOS%: 2.2 % (ref 0.0–7.0)
Eosinophils Absolute: 0.2 10*3/uL (ref 0.0–0.5)
HEMATOCRIT: 34.6 % — AB (ref 34.8–46.6)
HGB: 11.4 g/dL — ABNORMAL LOW (ref 11.6–15.9)
Immature Retic Fract: 4.1 % (ref 1.60–10.00)
LYMPH#: 0.7 10*3/uL — AB (ref 0.9–3.3)
LYMPH%: 8.6 % — ABNORMAL LOW (ref 14.0–49.7)
MCH: 33.3 pg (ref 25.1–34.0)
MCHC: 32.9 g/dL (ref 31.5–36.0)
MCV: 101.2 fL — ABNORMAL HIGH (ref 79.5–101.0)
MONO#: 0.7 10*3/uL (ref 0.1–0.9)
MONO%: 7.8 % (ref 0.0–14.0)
NEUT%: 81 % — ABNORMAL HIGH (ref 38.4–76.8)
NEUTROS ABS: 6.9 10*3/uL — AB (ref 1.5–6.5)
Platelets: 196 10*3/uL (ref 145–400)
RBC: 3.42 10*6/uL — AB (ref 3.70–5.45)
RDW: 15.9 % — AB (ref 11.2–14.5)
RETIC %: 2.39 % — AB (ref 0.70–2.10)
RETIC CT ABS: 81.74 10*3/uL (ref 33.70–90.70)
WBC: 8.6 10*3/uL (ref 3.9–10.3)

## 2015-06-29 MED ORDER — LACTULOSE 10 GM/15ML PO SOLN: 10.0000 g | Freq: Two times a day (BID) | ORAL | Status: DC

## 2015-06-29 MED ORDER — OXYCODONE HCL 5 MG PO TABS: 5.0000 mg | ORAL_TABLET | ORAL | Status: AC | PRN

## 2015-06-29 MED ORDER — OXYCODONE HCL ER 10 MG PO T12A: 10.0000 mg | EXTENDED_RELEASE_TABLET | Freq: Two times a day (BID) | ORAL | Status: DC

## 2015-06-29 MED ORDER — DIBUCAINE 1 % EX OINT: TOPICAL_OINTMENT | Freq: Three times a day (TID) | CUTANEOUS | Status: AC | PRN

## 2015-06-29 NOTE — Telephone Encounter (Signed)
per pof to sch appt as needed-adv pt [facility] will call to [facility] scan

## 2015-06-29 NOTE — Progress Notes (Signed)
Judy Cummings Kitchen  HEMATOLOGY ONCOLOGY PROGRESS NOTE  Date of service .06/15/2015   Patient Care Team: Shirline Frees, MD as PCP - General (Family Medicine) Brunetta Genera, MD as Consulting Physician (Hematology)  CC: Follow-up for metastatic pancreatic cancer.  Diagnosis: Metastatic pancreatic cancer  Current treatment: Best supportive cares /home palliative care services.  INTERVAL HISTORY: Judy Cummings is here for her scheduled followup for her pancreatic cancer along with her husband Judy Cummings. She notes her eyes and been doing better. Intermittent leg swelling which is currently better controlled. Eating has been variable with some days are better than others. He is willing to try lactase replacement to allow for trying some dairy products and ice cream since she enjoys that. Has been spending time with her family and friends. No significant worsening of abdominal distention at this time. Overall has been relatively comfortable. Was again offered the option of transitioning to home hospice services but at this time would prefer to continue on palliative care services and follow up in clinic to continue symptom management and best supportive cares. Wonders if we should do labs the next time to "see where things stand". Also wonders when we think things would get worse. I answered that might be the next 2 weeks to months but there is no way of knowing for sure. She understands and respects this understanding.  She appears to be emotionally dealing with her terminal state in quite a stoic fashion. Her strong faith certainly seems to be helping her.  REVIEW OF SYSTEMS:   10 point review of systems was done and is negative except as noted above.  I have reviewed the past medical history, past surgical history, social history and family history with the patient and they are unchanged from previous note.  ALLERGIES:  is allergic to boniva; delsym; erythromycin; fosamax; glucosamine forte; guaifenesin  & derivatives; lactose intolerance (gi); nexium; tegretol; and zostavax.  MEDICATIONS:  Current Outpatient Prescriptions  Medication Sig Dispense Refill  . AMBULATORY NON FORMULARY MEDICATION Take 90 mg by mouth 2 (two) times daily. Medication Name: Brilinta 90 mg BID provided by TWILIGHT Research study (Do Not Fill)    . AMBULATORY NON FORMULARY MEDICATION Take 81 mg by mouth daily. Medication Name: ASA 81 mg Daily or PLACEBO (TWILIGHT Study PROVIDED)    . dexamethasone (DECADRON) 2 MG tablet Take 1 tablet (2 mg total) by mouth 2 (two) times daily with a meal. (Patient taking differently: Take 2 mg by mouth 2 (two) times daily with a meal. Take for 3 days after chemo treatment.) 30 tablet 0  . lansoprazole (PREVACID) 30 MG capsule Take 1 capsule (30 mg total) by mouth daily before breakfast. 60 capsule 2  . lipase/protease/amylase (CREON) 12000 UNITS CPEP capsule Take 12,000 Units by mouth daily.    Judy Cummings Kitchen LORazepam (ATIVAN) 0.5 MG tablet Take 1-2 tablets (0.5-1 mg total) by mouth every 8 (eight) hours. 50 tablet 0  . losartan (COZAAR) 25 MG tablet Take 1 tablet (25 mg total) by mouth daily. 30 tablet 6  . metoprolol tartrate (LOPRESSOR) 25 MG tablet Take 0.5 tablets (12.5 mg total) by mouth 2 (two) times daily. 60 tablet 4  . oxyCODONE (OXY IR/ROXICODONE) 5 MG immediate release tablet Take 1 tablet (5 mg total) by mouth every 4 (four) hours as needed for severe pain. 60 tablet 0  . PRESCRIPTION MEDICATION Chemo [facility]    . prochlorperazine (COMPAZINE) 10 MG tablet Take 1 tablet (10 mg total) by mouth every 8 (eight) hours as needed  for refractory nausea / vomiting (if nausea not responding to zofran). 30 tablet 0  . senna-docusate (SENNA S) 8.6-50 MG per tablet Take 2 tablets by mouth at bedtime. May increase to 2 tab twice daily if constipation persistent 60 tablet 1  . TEGRETOL-XR 200 MG 12 hr tablet Take 1 tablet (200 mg total) by mouth 2 (two) times daily. 60 tablet 2  . Lactase 9000 UNITS CHEW Chew  1 tablet (9,000 Units total) by mouth 3 (three) times daily with meals as needed. 60 each 1  . ondansetron (ZOFRAN) 8 MG tablet TAKE ONE TABLET BY MOUTH EVERY 8 HOURS AS NEEDED FOR NAUSEA AND VOMITING 30 tablet 0   No current facility-administered medications for this visit.    PHYSICAL EXAMINATION: ECOG PERFORMANCE STATUS: 2  Filed Vitals:   06/15/15 1306  BP: 153/71  Pulse: 74  Temp: 98.6 F (37 C)  Resp: 17   Filed Weights   06/15/15 1306  Weight: 115 lb 8 oz (52.39 kg)   GENERAL: Alert, no distress and comfortable SKIN: skin color, texture, turgor are normal, no rashes or significant lesions EYES: normal, Conjunctiva clear, sclera clear OROPHARYNX: no exudate, no erythema and lips, buccal mucosa, and tongue normal  NECK: supple, thyroid normal size, non-tender, without nodularity LYMPH:  no palpable lymphadenopathy in the cervical, axillary or inguinal LUNGS: clear to auscultation and percussion with normal breathing effort HEART: regular rate & rhythm and no murmurs  ABDOMEN:abdomen soft, distended with hepatomegaly no palpable splenomegaly, no overt regarding or rigidity. Musculoskeletal:bilateral 1+ pitting pedal edema NEURO: alert & oriented x 3 with fluent speech, no focal motor/sensory deficits  LABORATORY DATA:   No new labs today  ASSESSMENT & PLAN:   1) Pancreatic carcinoma metastatic to the liver.  Patient has significant liver involvement with metastases involving perhaps 50 percent or more of her liver. ECOG performance status 1-2 .   Pancreatic carcinoma metastatic to liver ([place])   02/15/2015 Imaging CT Abd IMPRESSION: 1. Widespread hepatic metastatic disease. No underlying morphologic changes of cirrhosis are demonstrated to suggest multifocal hepatocellular carcinoma. 2. No primary malignancy identified within the abdomen. The pelvis was not imaged.    02/21/2015 Initial Diagnosis Metastasis to liver with unknown primary site   02/21/2015 Tumor Marker CA  19-9: 11838.7*   02/28/2015 PET scan 1. Widespread neoplastic disease throughout the liver and lymph nodes of the upper abdomen. 2. No definite extrahepatic primary malignancy identified. 3. Potential early nodal metastases within the right internal mammary, subcarinal and left hilar station   03/07/2015 Initial Biopsy Preliminary results of liver lesion biopsy as per discussion with pathology Dr Donato Heinz consistent with metastatic pancreatic carcinoma. Final results pending additional IHC stains.   03/10/2015 -  Chemotherapy Started on Gemcitabine +  Abraxane chemotherapy cycle 1   03/17/2015 -  Chemotherapy Cycle 1 day 8. Abraxane held due to elevated LFTs likely related to excessive acetaminophen intake. Received gemcitabine.   04/01/2015 -  Chemotherapy Cycle 2 day 1. Dose reduce gemcitabine directing her milligrams per meter square and Abraxane to 100 mg/m   Plan -Patient continues to be under supportive cares with home palliative care services. -She was given the option of enrolling in home hospice services but at this time chooses to continue follow-up in clinic for symptom management. -She has been doing emotionally quite well and has been spending time with her husband and other family and friends. -Continues to maintain a reasonable quality of life. -Pain well controlled -continue when necessary oxycodone -Continue  current antinausea medications. -Continue Ativan when necessary for insomnia, nausea and anxiety. -Will order CBC and CMP during next visit since patient would like to see where she stands. -all the comfort medications reviewed.  -return to care in 2 weeks with me for repeat assessment.  Earlier if any new concerns. -Given her lactose intolerance be added lactase enzyme to allow her to better enjoy a wider array of foods. Especially since she also wants to try and eat ice cream.  2) Ascites - likely malignant and abnormal liver functions due to liver metastases. Abdominal distention  predominantly from hepatomegaly. Previous ultrasound did not show a large amount of ascites that could be tapped. No worsening of abdominal distention on visit today. No further fevers noted. Afebrile today. Plan Naproxen twice a day when necessary for tumor fevers. Abdominal paracentesis when necessary of increasing abdominal distention. Might need to reduce Tegretol if worsening liver function.  4) CAD - s/p PCI this year. On [place], BB and ARB --- continue.  5] dry eyes-continue artificial tears eyedrops during the day and artificial tears ointment at nighttime.  . Orders Placed This Encounter  Procedures  . CBC & Diff and Retic    Standing Status: Future     Number of Occurrences:      Standing Expiration Date: 07/19/2016  . Comprehensive metabolic panel    Standing Status: Future     Number of Occurrences:      Standing Expiration Date: 07/19/2016    Return to care with Dr. Irene Limbo in 2-3 weeks for continues comfort/palliative care and symptom management input All questions were answered. The patient knows to call the clinic with any problems, questions or concerns.  I spent 25 minutes counseling the patient face to face. The total time spent in the appointment was 25 minutes and more than 50% was on counseling and review of test results   Sullivan Lone MD [place]  (Office):       [phone] (Work cell):  [phone] (Fax):           [phone]

## 2015-07-04 ENCOUNTER — Other Ambulatory Visit: Payer: Self-pay | Admitting: *Deleted

## 2015-07-04 ENCOUNTER — Telehealth: Payer: Self-pay | Admitting: *Deleted

## 2015-07-04 DIAGNOSIS — C259 Malignant neoplasm of pancreas, unspecified: Secondary | ICD-10-CM

## 2015-07-04 MED ORDER — OXYCODONE HCL ER 10 MG PO T12A: 10.0000 mg | EXTENDED_RELEASE_TABLET | Freq: Two times a day (BID) | ORAL | Status: AC

## 2015-07-04 NOTE — Telephone Encounter (Signed)
Hall Busing RN called requesting a new script for oxycontin 10 mg q 12 hours [facility] [address].  Patient being admitted to Hospice today.  Recent script was taken to [facility] on [address] who will not get this in until Thursday.  [facility] will destroy the prescription they have on hand.[name]

## 2015-07-04 NOTE — Telephone Encounter (Signed)
[facility] [address]. Informed Hospice RN.

## 2015-07-04 NOTE — Progress Notes (Signed)
Cardiology Office Note   Date:  07/05/2015   ID:  Judy Cummings, DOB 1946-01-26, MRN [id number]  PCP:  Shirline Frees, MD    Chief Complaint  Patient presents with  . Coronary Artery Disease    no refills  . Hyperlipidemia      History of Present Illness: This is a 69 y/o female who presented in early June 2016 with complaints of fatigue, DOE, and palpitations. The pt had risk factors for CAD and had PVD (moderate bilateral ICA disease). Work up done included and echo ( essentially normal) and a Myoview 02/02/15 that was positive for ischemia. She underwent cath and was found to have severe LAD and RCA disease. She underwent DES placement and was enrolled in the TWILIGHT study 02/07/15.   She also had problems with anorexia and was seen by [facility] and a CT done 02/15/15 showed widespread hepatic neoplastic disease and bx confirmed metastatic pancreatic CA. She is followed by Dr Irene Limbo with oncology.   From a cardiac standpoint she is doing well.  She denies any chest pain,  dizziness, palpitations or syncope.  She has been getting progressively worse with her pancreatic CA.  Her abdomen has increased in size from malignant ascites over the past few days and is having a lot more abdominal pain.  She is now on oxycontin as well as oxycodone.  She is quite tearful in the office today.  She has some SOB when she takes the oxycontin.  She has has some LE edema recently as well.  Past Medical History  Diagnosis Date  . Osteopenia   . Hypercholesterolemia   . Menopause   . Rosacea   . Lactose intolerance   . TMJ (dislocation of temporomandibular joint)   . Fatigue   . Rosacea   . TMJ (dislocation of temporomandibular joint)   . Mild aortic stenosis     echo 01/2015  . Coronary artery disease   . Family history of adverse reaction to anesthesia     "my mother gets PONV"  . GERD (gastroesophageal reflux disease)   . Epileptic seizure ([place])     "controlled; started w/my  periods; last one was years ago" (02/07/2015)  . Sleep disorder   . Metastasis to liver with unknown primary site [facility]) 02/22/2015    Patient presented with upper abdominal pain CT scan of the abdomen on 02/15/2015 showed innumerable liver lesions concerning for metastatic malignancy. CA 19-9 levels On 02/21/2015 were noted to be 11,800 making pancreatic adenocarcinoma the likely primary site.   . Family history of pancreatic cancer   . Family history of colon cancer   . Family history of breast cancer     Past Surgical History  Procedure Laterality Date  . Tonsillectomy    . Coronary angioplasty    . Ankle fracture surgery Left ~ 1977    "put 4 pins in"  . Hemangioma excision  ~ 2006    "inside my mouth"  . Hemangioma w/ laser excision  "yearly since ~ 2006"    "inside my mouth"  . Cardiac catheterization N/A 02/07/2015    Procedure: Left Heart Cath and Coronary Angiography;  Surgeon: Burnell Blanks, MD;  Location: [place] [facility];  Service: Cardiovascular;  Laterality: N/A;  . Cardiac catheterization N/A 02/07/2015    Procedure: Coronary Stent Intervention;  Surgeon: Burnell Blanks, MD;  Location: [place] [facility];  Service: Cardiovascular;  Laterality: N/A;     Current Outpatient Prescriptions  Medication Sig Dispense Refill  . AMBULATORY NON FORMULARY MEDICATION Take 90 mg by mouth 2 (two) times daily. Medication Name: Brilinta 90 mg BID provided by TWILIGHT Research study (Do Not Fill)    . AMBULATORY NON FORMULARY MEDICATION Take 81 mg by mouth daily. Medication Name: ASA 81 mg Daily or PLACEBO (TWILIGHT Study PROVIDED)    . aspirin 81 MG tablet Take 81 mg by mouth daily.    [name] dexamethasone (DECADRON) 2 MG tablet Take 1 tablet (2 mg total) by mouth 2 (two) times daily with a meal. (Patient taking differently: Take 2 mg by mouth 2 (two) times daily with a meal. Take for 3 days after chemo treatment.) 30 tablet 0  . dibucaine (NUPERCAINAL) 1 % ointment Apply  topically 3 (three) times daily as needed for pain. For hemorrhoidal pain. 30 g 0  . Lactase 9000 UNITS CHEW Chew 1 tablet (9,000 Units total) by mouth 3 (three) times daily with meals as needed. (Patient taking differently: Chew 9,000 Units by mouth 3 (three) times daily with meals as needed (for lactose intolerance). ) 60 each 1  . lansoprazole (PREVACID) 30 MG capsule Take 1 capsule (30 mg total) by mouth daily before breakfast. 60 capsule 2  . lipase/protease/amylase (CREON) 12000 UNITS CPEP capsule Take 12,000 Units by mouth daily.    [name] LORazepam (ATIVAN) 0.5 MG tablet Take 1-2 tablets (0.5-1 mg total) by mouth every 8 (eight) hours. 50 tablet 0  . losartan (COZAAR) 25 MG tablet Take 1 tablet (25 mg total) by mouth daily. 30 tablet 6  . metoprolol tartrate (LOPRESSOR) 25 MG tablet Take 0.5 tablets (12.5 mg total) by mouth 2 (two) times daily. 60 tablet 4  . ondansetron (ZOFRAN) 8 MG tablet TAKE ONE TABLET BY MOUTH EVERY 8 HOURS AS NEEDED FOR NAUSEA AND VOMITING 30 tablet 0  . oxyCODONE (OXY IR/ROXICODONE) 5 MG immediate release tablet Take 1 tablet (5 mg total) by mouth every 4 (four) hours as needed for severe pain. 90 tablet 0  . oxyCODONE (OXYCONTIN) 10 mg 12 hr tablet Take 1 tablet (10 mg total) by mouth every 12 (twelve) hours. 60 tablet 0  . PRESCRIPTION MEDICATION Chemo [facility]    . prochlorperazine (COMPAZINE) 10 MG tablet Take 1 tablet (10 mg total) by mouth every 8 (eight) hours as needed for refractory nausea / vomiting (if nausea not responding to zofran). 30 tablet 0  . senna-docusate (SENNA S) 8.6-50 MG per tablet Take 2 tablets by mouth at bedtime. May increase to 2 tab twice daily if constipation persistent 60 tablet 1  . TEGRETOL-XR 200 MG 12 hr tablet Take 1 tablet (200 mg total) by mouth 2 (two) times daily. 60 tablet 2  . ticagrelor (BRILINTA) 90 MG TABS tablet Take by mouth 2 (two) times daily.     No current facility-administered medications for this visit.    Allergies:    Boniva; Delsym; Erythromycin; Erythromycin base; Fosamax; Glucosamine forte; Guaifenesin & derivatives; Lactose intolerance (gi); Nexium; Phenylephrine-guaifenesin; Tegretol; and Zostavax    Social History:  The patient  reports that she has never smoked. She has never used smokeless tobacco. She reports that she does not drink alcohol or use illicit drugs.   Family History:  The patient's family history includes Atrial fibrillation in her mother; Breast cancer in her other; CAD in her father; CVA in her father and paternal grandmother; Colon cancer in her other; Colon cancer (  age of onset: 3) in her paternal grandfather; Diabetes Mellitus I in her mother; Heart attack in her father and maternal grandfather; Hyperlipidemia in her sister; Hypertension in her mother; Pancreatic cancer (age of onset: 55) in her paternal uncle.    ROS:  Please see the history of present illness.   Otherwise, review of systems are positive for none.   All other systems are reviewed and negative.    PHYSICAL EXAM: VS:  BP 140/70 mmHg  Pulse 75  Ht 5\' 3"  (1.6 m)  Wt 59.784 kg (131 lb 12.8 oz)  BMI 23.35 kg/m2 , BMI Body mass index is 23.35 kg/(m^2). GEN: Well nourished, well developed, in no acute distress HEENT: normal Neck: no JVD, carotid bruits, or masses Cardiac: RRR; no murmurs, rubs, or gallops.  2+ edema Respiratory:  clear to auscultation bilaterally, normal work of breathing GI: soft, nontender, nondistended, + BS MS: no deformity or atrophy Skin: warm and dry, no rash Neuro:  Strength and sensation are intact Psych: euthymic mood, full affect   EKG:  EKG is not ordered today.    Recent Labs: 03/31/2015: Magnesium 1.8 06/29/2015: ALT 24; BUN 9.8; Creatinine 0.6; HGB 11.4*; Platelets 196; Potassium 4.4; Sodium 132*    Lipid Panel    Component Value Date/Time   CHOL 134 03/24/2015 1354   TRIG 79 03/24/2015 1354   HDL 33* 03/24/2015 1354   CHOLHDL 4.1 03/24/2015 1354   VLDL 16 03/24/2015  1354   LDLCALC 85 03/24/2015 1354      Wt Readings from Last 3 Encounters:  07/05/15 59.784 kg (131 lb 12.8 oz)  06/15/15 52.39 kg (115 lb 8 oz)  05/25/15 52.3 kg (115 lb 4.8 oz)        ASSESSMENT AND PLAN:  1.  ASCAD s/p DES to LAD and RCA with no angina.  Continue ASA/Brilinta/BB. 2.  Metastatic pancreatic CA to the liver - followed by oncology - she appears to be worsening clinically with increased abdominal swelling and pain.   3.  Dyslipidemia - not on statin due to liver burden of CA 4.  Mild AS by echo 01/2015 5.  LE edema - she has marked LE edema most likely from venous insufficiency from tumor burden in her abdomen compressing IVC as well as hypoalbuminemia.  I will check with her Oncologist to see if he is ok with adding Lasix.  She is wearing compression hose and still has significant LE edema.    Current medicines are reviewed at length with the patient today.  The patient does not have concerns regarding medicines.  The following changes have been made:  no change  Labs/ tests ordered today: See above Assessment and Plan No orders of the defined types were placed in this encounter.     Disposition:   FU with me in 3 months  Signed, [name], MD  07/05/2015 10:53 AM    [facility] [address], [place], [place]  [address] Phone: [phone]; Fax: [phone]

## 2015-07-04 NOTE — Telephone Encounter (Signed)
[facility] [address]ice patient. Recent script was taken to [facility] but not filled due to shortage.

## 2015-07-05 ENCOUNTER — Telehealth: Payer: Self-pay | Admitting: *Deleted

## 2015-07-05 ENCOUNTER — Ambulatory Visit (INDEPENDENT_AMBULATORY_CARE_PROVIDER_SITE_OTHER): Payer: Medicare PPO | Admitting: Cardiology

## 2015-07-05 ENCOUNTER — Encounter: Payer: Self-pay | Admitting: Cardiology

## 2015-07-05 VITALS — BP 140/70 | HR 75 | Ht 63.0 in | Wt 131.8 lb

## 2015-07-05 DIAGNOSIS — R609 Edema, unspecified: Secondary | ICD-10-CM | POA: Diagnosis not present

## 2015-07-05 DIAGNOSIS — R600 Localized edema: Secondary | ICD-10-CM

## 2015-07-05 DIAGNOSIS — Z9861 Coronary angioplasty status: Secondary | ICD-10-CM

## 2015-07-05 DIAGNOSIS — I2510 Atherosclerotic heart disease of native coronary artery without angina pectoris: Secondary | ICD-10-CM | POA: Diagnosis not present

## 2015-07-05 DIAGNOSIS — I10 Essential (primary) hypertension: Secondary | ICD-10-CM | POA: Diagnosis not present

## 2015-07-05 DIAGNOSIS — E785 Hyperlipidemia, unspecified: Secondary | ICD-10-CM

## 2015-07-05 NOTE — Telephone Encounter (Signed)
Received call from [facility]/RN/Hospice/[place] stating that pt probably had not had port flushed since Aug.  Confirmed port flushed in Sept.  Order given per Dr Irene Limbo to flush port every 4-6 wks.  RN will visit pt tomorrow.

## 2015-07-05 NOTE — Patient Instructions (Signed)
Medication Instructions:  Your physician recommends that you continue on your current medications as directed. Please refer to the Current Medication list given to you today.   Labwork: None  Testing/Procedures: None  Follow-Up: Your physician recommends that you schedule a follow-up appointment in: 3 months with Dr. Radford Pax.  Any Other Special Instructions Will Be Listed Below (If Applicable).     If you need a refill on your cardiac medications before your next appointment, please call your pharmacy.

## 2015-07-06 ENCOUNTER — Encounter (HOSPITAL_COMMUNITY): Payer: Self-pay

## 2015-07-11 ENCOUNTER — Ambulatory Visit (HOSPITAL_COMMUNITY): Payer: Medicare PPO

## 2015-07-13 ENCOUNTER — Encounter: Payer: Self-pay | Admitting: *Deleted

## 2015-07-13 DIAGNOSIS — Z006 Encounter for examination for normal comparison and control in clinical research program: Secondary | ICD-10-CM

## 2015-07-14 NOTE — Progress Notes (Signed)
[name]  HEMATOLOGY ONCOLOGY PROGRESS NOTE  Date of service .06/29/2015  Patient Care Team: Shirline Frees, MD as PCP - General (Family Medicine) Brunetta Genera, MD as Consulting Physician (Hematology)  CC: Follow-up for metastatic pancreatic cancer.  Diagnosis: Metastatic pancreatic cancer  Current treatment: Best supportive cares /home palliative care services.  INTERVAL HISTORY: [name] is here for her scheduled followup for her pancreatic cancer along with [name]. Her overall health certainly appears to have deteriorated significantly since her last visit. She notes significant increase in abdominal distention and discomfort and increased leg swelling. Notes significant fatigue and has been more forgetful. ADLs have become more of an issue. She notes that its becoming more and more difficult for her to come to clinic below she likes to. We discussed in detail medication management to help with her symptom control. We discussed and she and [name] were agreeable to proceed with official enrollment in hospice and a referral was made towards this goals. She notes that she will be meeting her cardiologist and discuss with her Brilinta can be held for possible paracentesis. We decided to proceed with an ultrasound of the abdomen. No fevers or chills.Supportive counseling was provided to the patient and her husband.   REVIEW OF SYSTEMS:   10 point review of systems was done and is negative except as noted above.  I have reviewed the past medical history, past surgical history, social history and family history with the patient and they are unchanged from previous note.  ALLERGIES:  is allergic to boniva; delsym; erythromycin; erythromycin base; fosamax; glucosamine forte; guaifenesin & derivatives; lactose intolerance (gi); nexium; phenylephrine-guaifenesin; tegretol; and zostavax.  MEDICATIONS:  Current Outpatient Prescriptions  Medication Sig Dispense Refill  .  AMBULATORY NON FORMULARY MEDICATION Take 90 mg by mouth 2 (two) times daily. Medication Name: Brilinta 90 mg BID provided by [employer] Research study (Do Not Fill)    . AMBULATORY NON FORMULARY MEDICATION Take 81 mg by mouth daily. Medication Name: ASA 81 mg Daily or PLACEBO ([facility]IL[facility] Study PROVIDED)    . aspirin 81 MG tablet Take 81 mg by mouth daily.    [name] dexamethasone (DECADRON) 2 MG tablet Take 1 tablet (2 mg total) by mouth 2 (two) times daily with a meal. (Patient taking differently: Take 2 mg by mouth 2 (two) times daily with a meal. Take for 3 days after chemo treatment.) 30 tablet 0  . dibucaine (NUPERCAINAL) 1 % ointment Apply topically 3 (three) times daily as needed for pain. For hemorrhoidal pain. 30 g 0  . Lactase 9000 UNITS CHEW Chew 1 tablet (9,000 Units total) by mouth 3 (three) times daily with meals as needed. (Patient taking differently: Chew 9,000 Units by mouth 3 (three) times daily with meals as needed (for lactose intolerance). ) 60 each 1  . lansoprazole (PREVACID) 30 MG capsule Take 1 capsule (30 mg total) by mouth daily before breakfast. 60 capsule 2  . lipase/protease/amylase (CREON) 12000 UNITS CPEP capsule Take 12,000 Units by mouth daily.    [name] LORazepam (ATIVAN) 0.5 MG tablet Take 1-2 tablets (0.5-1 mg total) by mouth every 8 (eight) hours. 50 tablet 0  . losartan (COZAAR) 25 MG tablet Take 1 tablet (25 mg total) by mouth daily. 30 tablet 6  . metoprolol tartrate (LOPRESSOR) 25 MG tablet Take 0.5 tablets (12.5 mg total) by mouth 2 (two) times daily. 60 tablet 4  . ondansetron (ZOFRAN) 8 MG tablet TAKE ONE TABLET BY MOUTH EVERY 8 HOURS AS NEEDED  FOR NAUSEA AND VOMITING 30 tablet 0  . oxyCODONE (OXY IR/ROXICODONE) 5 MG immediate release tablet Take 1 tablet (5 mg total) by mouth every 4 (four) hours as needed for severe pain. 90 tablet 0  . oxyCODONE (OXYCONTIN) 10 mg 12 hr tablet Take 1 tablet (10 mg total) by mouth every 12 (twelve) hours. 60 tablet 0  . PRESCRIPTION  MEDICATION Chemo [facility]    . prochlorperazine (COMPAZINE) 10 MG tablet Take 1 tablet (10 mg total) by mouth every 8 (eight) hours as needed for refractory nausea / vomiting (if nausea not responding to zofran). 30 tablet 0  . senna-docusate (SENNA S) 8.6-50 MG per tablet Take 2 tablets by mouth at bedtime. May increase to 2 tab twice daily if constipation persistent 60 tablet 1  . TEGRETOL-XR 200 MG 12 hr tablet Take 1 tablet (200 mg total) by mouth 2 (two) times daily. 60 tablet 2  . ticagrelor (BRILINTA) 90 MG TABS tablet Take by mouth 2 (two) times daily.     No current facility-administered medications for this visit.    PHYSICAL EXAMINATION: ECOG PERFORMANCE STATUS: 2  Filed Vitals:   06/29/15 1335  BP: 143/76  Pulse: 78  Temp: 97.5 F (36.4 C)  Resp: 18   There were no vitals filed for this visit. GENERAL: Alert, no distress and comfortable SKIN: skin color, texture, turgor are normal, no rashes or significant lesions EYES: normal, Conjunctiva clear, sclera clear OROPHARYNX: no exudate, no erythema and lips, buccal mucosa, and tongue normal  NECK: supple, thyroid normal size, non-tender, without nodularity LYMPH:  no palpable lymphadenopathy in the cervical, axillary or inguinal LUNGS: clear to auscultation and percussion with normal breathing effort HEART: regular rate & rhythm and no murmurs  ABDOMEN: Significantly distended with significant hepatomegaly and possibly some ascites  Musculoskeletal:bilateral 2+ pitting pedal edema NEURO: alert & oriented x 3 with fluent speech, no focal motor/sensory deficits  LABORATORY DATA:   No new labs today  ASSESSMENT & PLAN:   1) Pancreatic carcinoma metastatic to the liver.  Patient has significant liver involvement with metastases involving perhaps 50 percent or more of her liver. ECOG performance status 2-3 Plan -Continue current plan of care for pain management, nausea management. -After confirming with the patient and her  husband we Sent referral to [facility] hospice for official enrollment into hospice services given her increased burden of discomfort and waning energy levels. -Patient is keen to focus on best supportive care and symptom management.  2) Ascites - likely malignant and abnormal liver functions due to liver metastases. Abdominal distention predominantly from hepatomegaly. Previous ultrasound did not show a large amount of ascites that could be tapped. No further fevers noted. Afebrile today. Plan We ordered ultrasound of the abdomen to see if there is significant ascites and we will consider tapping it is required for comfort. Patient is going to be seeing her cardiologist to see if she can stop her Brilinta for the procedure.  4) CAD - s/p PCI this year. On Brillant, BB and ARB --- continue.  5] dry eyes-improvement in symptoms .Continue artificial tears eyedrops during the day and artificial tears ointment at nighttime.  . Orders Placed This Encounter  Procedures  . US Abdomen Complete    Standing Status: Future     Number of Occurrences:      Standing Expiration Date: 06/28/2016    Order Specific Question:  Reason for Exam (SYMPTOM  OR DIAGNOSIS REQUIRED)    Answer:  metastatic pancreatic cancer increasing  abd distension ?amount of ascites -considering indwelling drainage catheter    Order Specific Question:  Preferred imaging location?    Answer:  [facility]    Return to care with Dr. Irene Limbo as needed if any other questions or concerns arise. She has been given an official referral for enrollment into hospice so that a lot of her cares and symptom management can occur at home. I shall all is be available to help with anything as needed.  I spent 25 minutes counseling the patient face to face. The total time spent in the appointment was 25 minutes and more than 50% was on counseling and review of test results   Sullivan Lone MD [place]  (Office):       [phone] (Work cell):  [phone] (Fax):           [phone]

## 2015-07-14 DEATH — deceased

## 2015-07-18 NOTE — Progress Notes (Signed)
Received message from Dijana Prato (husband) that patient PASSED AWAY Jul 26, 2015 around 1:50. Condolences were expressed .

## 2015-10-11 ENCOUNTER — Ambulatory Visit: Payer: Medicare PPO | Admitting: Cardiology

## 2015-11-07 ENCOUNTER — Other Ambulatory Visit: Payer: Self-pay | Admitting: Hematology

## 2016-01-27 ENCOUNTER — Other Ambulatory Visit: Payer: Self-pay | Admitting: Nurse Practitioner

## 2017-02-14 IMAGING — PT NM PET TUM IMG INITIAL (PI) SKULL BASE T - THIGH
8 series · 25 of 25 positions shown · non-contrast
Comparison: Abdominal CT 02/15/2015.

CLINICAL DATA: Initial treatment strategy for hepatic metastatic
disease of unknown origin.

EXAM:
NUCLEAR MEDICINE PET SKULL BASE TO THIGH
TECHNIQUE: 5.96 mCi F-18 FDG was injected intravenously. Full-ring PET imaging
was performed from the skull base to thigh after the radiotracer. CT
data was obtained and used for attenuation correction and anatomic
localization.
FASTING BLOOD GLUCOSE:  Value: 111 mg/dl

[Series 3: pet sk_thigh ac · axial · 5.0mm · 4.07mm/px · z∈[-921,-81]mm · 4 of 211 slices shown]
[im 1/211]
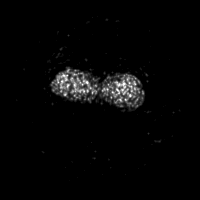
[im 71/211]
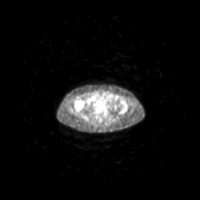
[im 141/211]
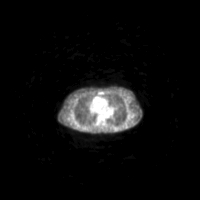
[im 211/211]
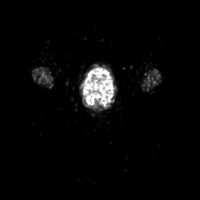

[Series 4: ct sk_thigh 5.0 b31f · axial · 5.0mm · 0.89mm/px · z∈[-921,-81]mm · 5 of 209 slices shown]
[im 1/209]
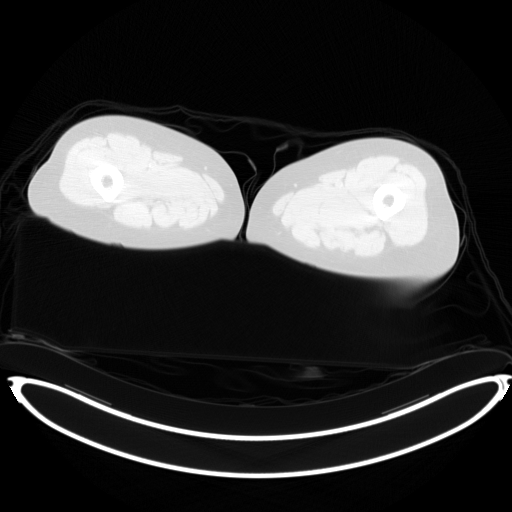
[im 53/209]
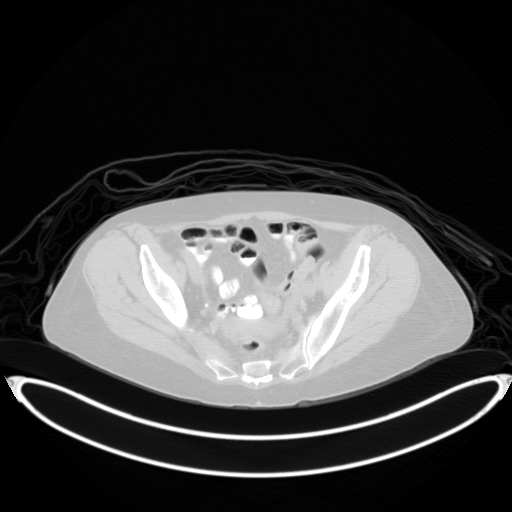
[im 105/209]
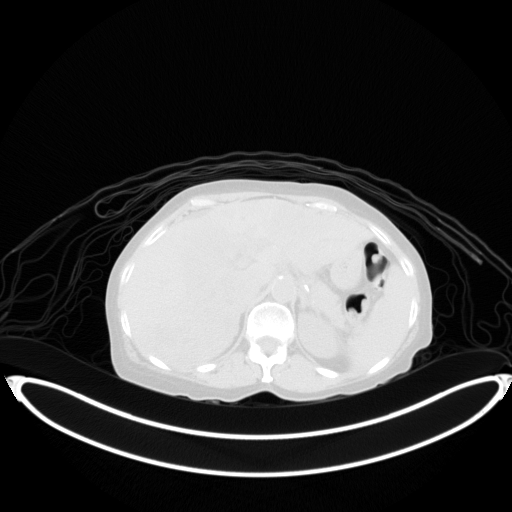
[im 157/209]
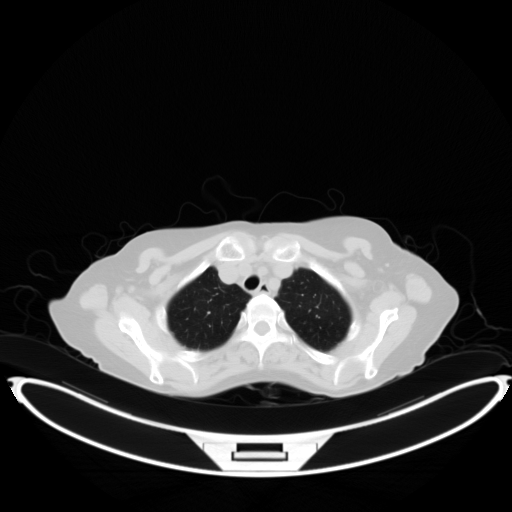
[im 209/209  brain]
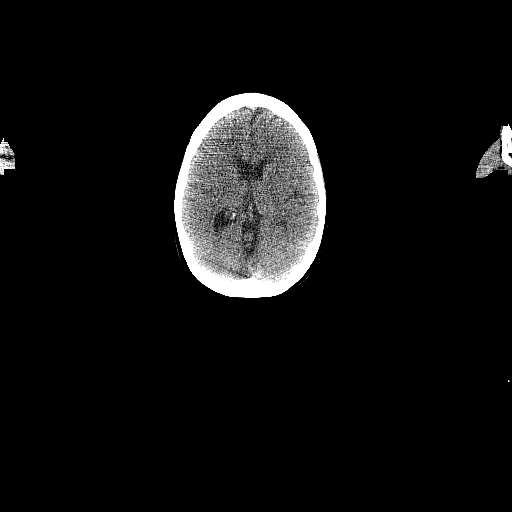

[Series 6: ct sk_thigh 5.0 b70f (id)_bone · axial · 5.0mm · 0.58mm/px · z∈[-489,-237]mm · 2 of 64 slices shown]
[im 1/64  bone]
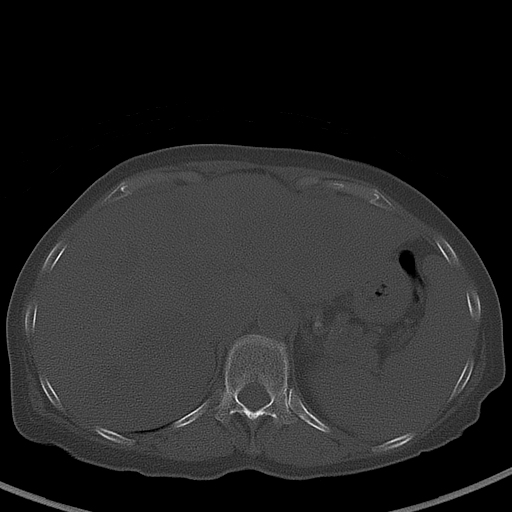
[im 64/64  bone]
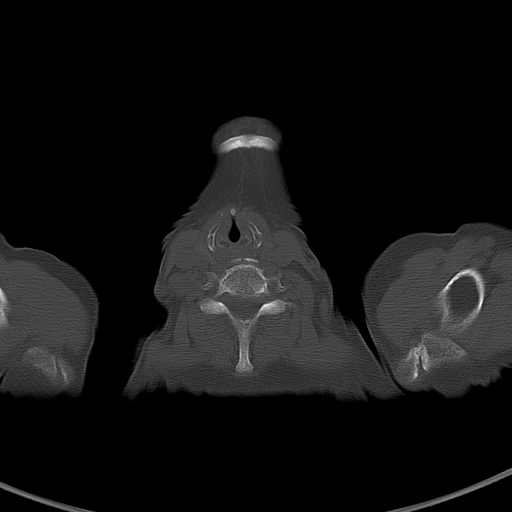

[Series 8: pet sk_thigh nac · axial · 5.0mm · 4.07mm/px · z∈[-921,-81]mm · 5 of 211 slices shown]
[im 1/211]
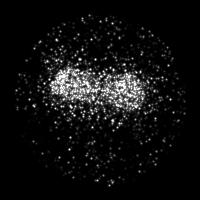
[im 53/211]
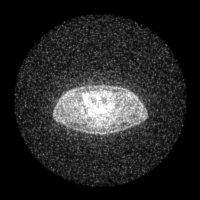
[im 106/211]
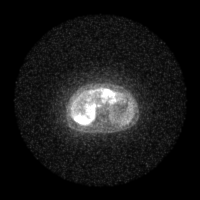
[im 158/211]
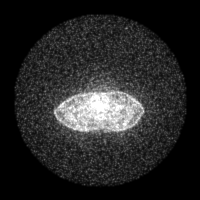
[im 211/211]
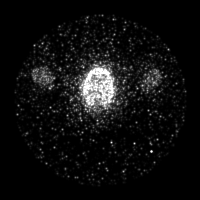

[Series 604: mip collection<mip range> · coronal · 1.74mm/px · 1 of 32 slices shown]
[im 1/32]
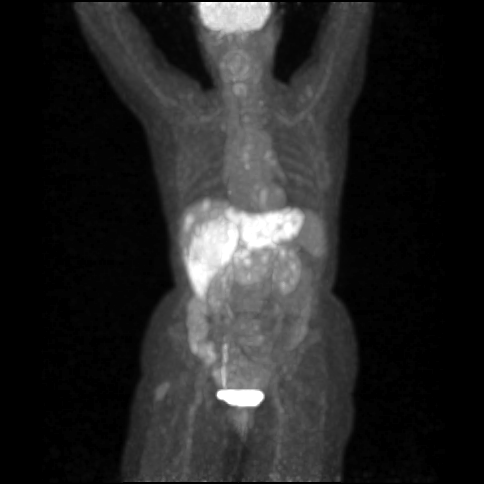

[Series 605: range-ct sk_thigh 5.0 (id)<alpha range> · 2 of 75 slices shown (1 of 2)]
[im 1/75]
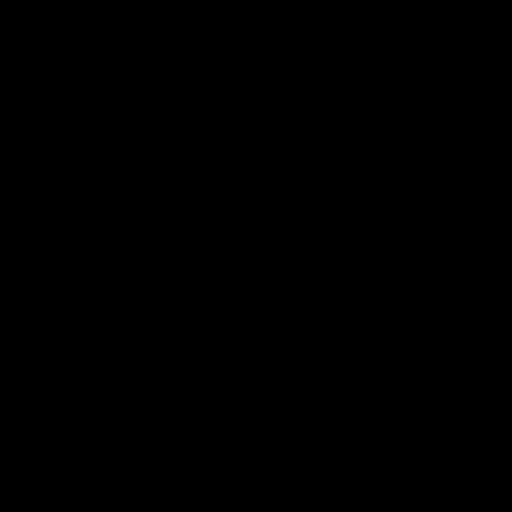
[im 75/75]
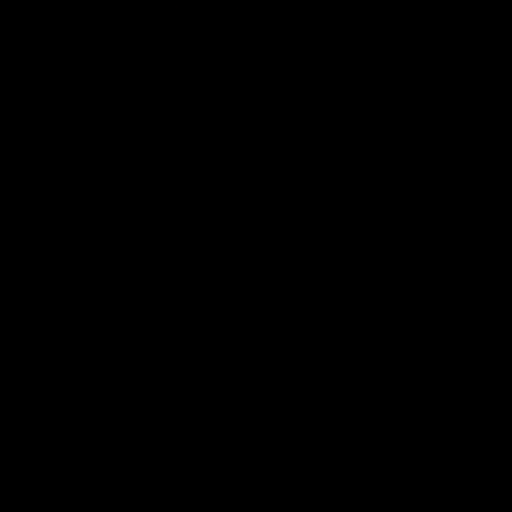

[Series 606: range-ct sk_thigh 5.0 (id)<alpha range> · 5 of 187 slices shown (2 of 2)]
[im 1/187]
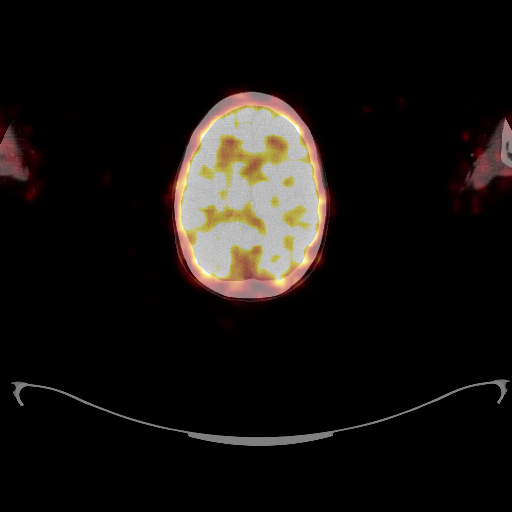
[im 47/187]
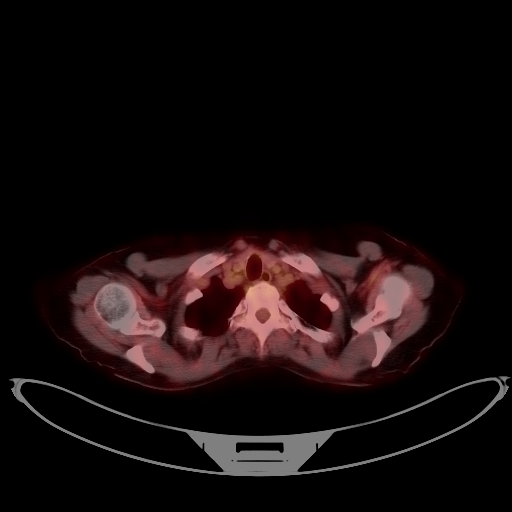
[im 94/187]
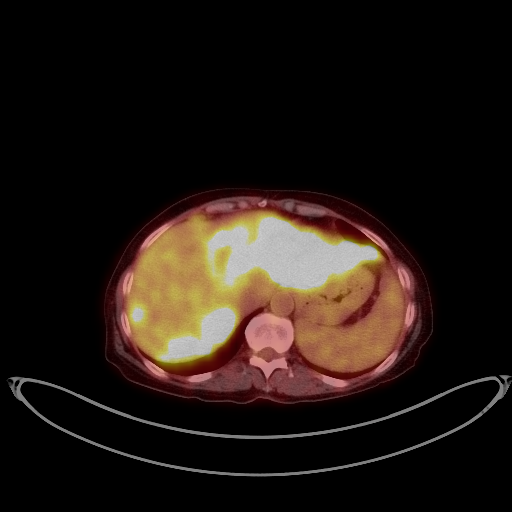
[im 140/187]
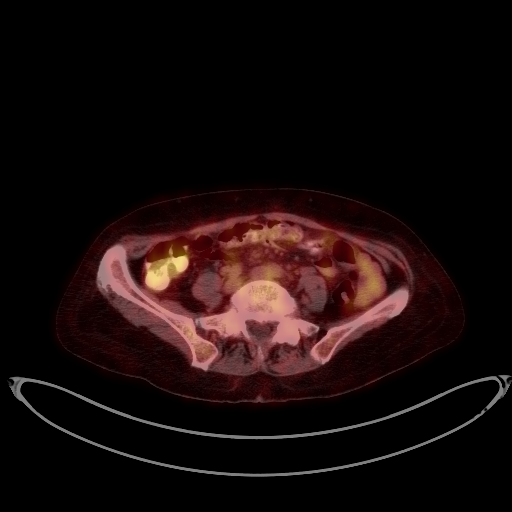
[im 187/187]
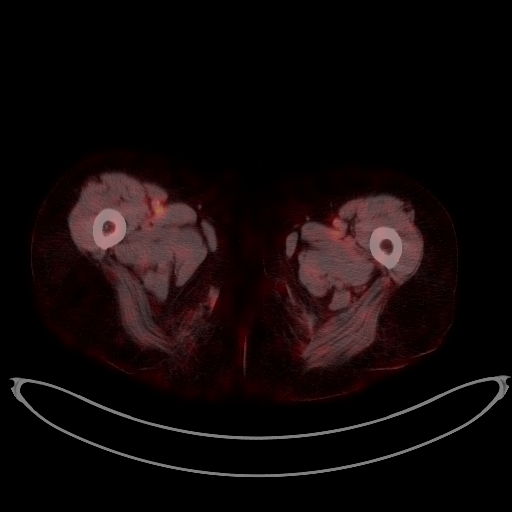

[Series 1034: results mm oncology reading · 0.94mm/px · 1 of 6 slices shown]
[im 1/6]
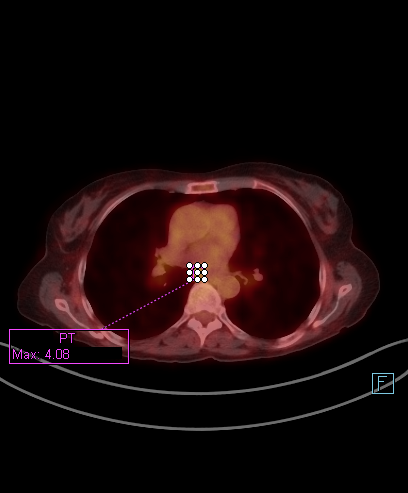

[25 of 25 positions shown; findings below may reference images not displayed]

FINDINGS: NECK

No hypermetabolic cervical lymph nodes are identified.There are no
lesions of the pharyngeal mucosal space. Dense right thyroid
calcification noted.

CHEST

There is minimal hypermetabolic activity in the left hilar and
subcarinal regions without apparent corresponding enlarged lymph
nodes. This activity has an SUV max of 3.7 and 4.1, respectively.
There is also mildly increased activity in the lower right
parasternal region with an SUV max of 3.3. This may correspond with
a pleural lesion or internal mammary lymph node, although no
discretely enlarged lymph node identified. There is no suspicious
pulmonary activity.

ABDOMEN/PELVIS

As demonstrated on CT, there is widespread hypermetabolic tumor
throughout the liver involving all segments. There are confluent
hypermetabolic components within the left lobe (SUV max 13.4) and
posteriorly in the right lobe (SUV max 10.5). There are
hypermetabolic lymph nodes within the upper abdomen involving the
porta hepatis and retroperitoneum (SUV max 7.0). No abnormal
metabolic activity seen within the spleen, adrenal glands or
pancreas. No bowel lesions identified. There is no pelvic
adenopathy.

SKELETON

There is no hypermetabolic activity to suggest osseous metastatic
disease. There is no abnormal activity within the breasts.
IMPRESSION: 1. Widespread neoplastic disease throughout the liver and lymph
nodes of the upper abdomen.
2. No definite extrahepatic primary malignancy identified.
3. Potential early nodal metastases within the right internal
mammary, subcarinal and left hilar stations.
4. Tissue sampling recommended.

## 2017-02-21 IMAGING — XA IR FLUORO GUIDE CV LINE*R*
1 series · 1 of 1 positions shown · non-contrast
Comparison: PET-CT -02/27/2025

INDICATION: Multiple hypermetabolic liver lesions of uncertain etiology. Please
perform ultrasound-guided liver lesion biopsy for tissue diagnostic
purposes.

[Series 1: run · 1 of 1 slices shown]
[im 1/1]
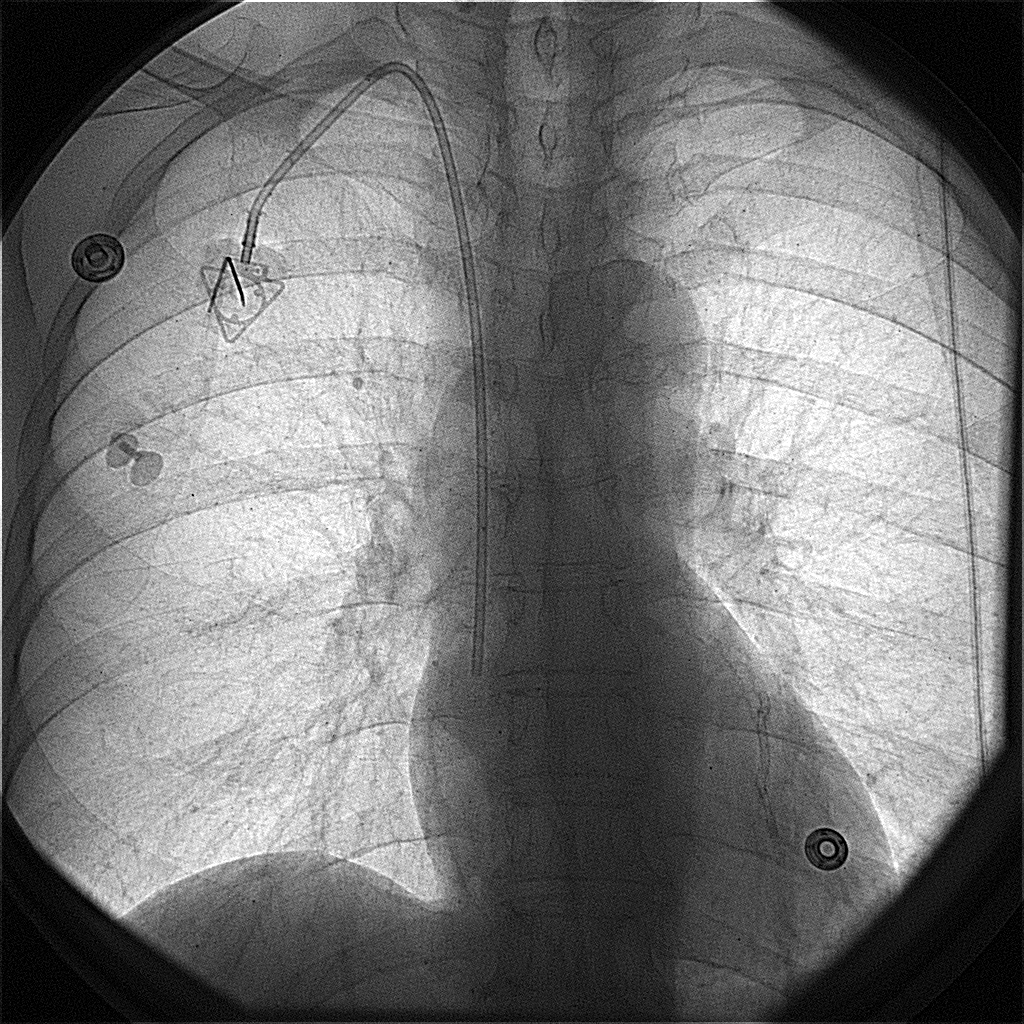

[1 of 1 positions shown; findings below may reference images not displayed]

Given history of presumed widely metastatic disease, request made
for placement of a port a catheter for durable intravenous access
for chemotherapy administration.

Given patient's history of recent mild cardial infarction assisting
placement of a coronary stent, both the ultrasound-guided liver
lesion biopsy as well as the Port a Catheter placement will be
performed simultaneously as to limit the amount of time the patient
is without anti coagulation.

EXAM:
1. IMPLANTED PORT A CATH PLACEMENT WITH ULTRASOUND AND FLUOROSCOPIC
GUIDANCE
2. ULTRASOUND-GUIDED LIVER LESION BIOPSY
MEDICATIONS:
Ancef 2 gm IV; The antibiotic was administered within an appropriate
time interval prior to skin puncture.

ANESTHESIA/SEDATION:
Versed 1.5 mg IV; Fentanyl 125 mcg IV;

Total Moderate Sedation Time

65  minutes.

CONTRAST:  None

FLUOROSCOPY TIME:  36 seconds (4.3 mGy)

COMPLICATIONS:
None immediate

PROCEDURE:
The procedure, risks, benefits, and alternatives were explained to
the patient. Questions regarding the procedure were encouraged and
answered. The patient understands and consents to the procedure.

The right neck and chest were prepped with chlorhexidine in a
sterile fashion, and a sterile drape was applied covering the
operative field. Maximum barrier sterile technique with sterile
gowns and gloves were used for the procedure. A timeout was
performed prior to the initiation of the procedure. Local anesthesia
was provided with 1% lidocaine with epinephrine.

After creating a small venotomy incision, a micropuncture kit was
utilized to access the internal jugular vein under direct, real-time
ultrasound guidance. Ultrasound image documentation was performed.
The microwire was kinked to measure appropriate catheter length.

A subcutaneous port pocket was then created along the upper chest
wall utilizing a combination of sharp and blunt dissection. The
pocket was irrigated with sterile saline. A single lumen ISP power
injectable port was chosen for placement. The 8 Fr catheter was
tunneled from the port pocket site to the venotomy incision. The
port was placed in the pocket. The external catheter was trimmed to
appropriate length. At the venotomy, an 8 Fr peel-away sheath was
placed over a guidewire under fluoroscopic guidance. The catheter
was then placed through the sheath and the sheath was removed. Final
catheter positioning was confirmed and documented with a
fluoroscopic spot radiograph. The port was accessed with Xisong Weber-Mollinger
needle, aspirated and flushed with heparinized saline.

The venotomy site was closed with an interrupted 4-0 Vicryl suture.
The port pocket incision was closed with interrupted 2-0 Vicryl
suture and the skin was opposed with a running subcuticular 4-0
Vicryl suture. Dermabond and Erickson were applied to both
incisions. Dressings were placed.

Attention was now paid towards the ultrasound-guided liver lesion
biopsy.

Ultrasound scanning was performed of the right upper abdominal
quadrant demonstrates multiple ill-defined hypoechoic liver lesions
compatible with the known extensive hepatic metastatic disease
demonstrated on preceding PET-CT. Note was made of a small amount of
intra-abdominal ascites, likely progressed since recently performed
PET-CT.

A dominant approximately 1.5 x 1.5 cm nodule within the medial
segment of the left lobe of the liver was targeted for biopsy given
lesion location and sonographic window. The procedure was planned.
The right upper abdominal quadrant was prepped and draped in the
usual sterile fashion. The overlying soft tissues were anesthetized
with 1% lidocaine with epinephrine. A 17 gauge, 6.8 cm co-axial
needle was advanced into a peripheral aspect of the lesion. This was
followed by 4 core biopsies with an 18 gauge core device under
direct ultrasound guidance. Multiple ultrasound images were saved
for documentation purposes.

The coaxial needle track was embolized with a small amount of
Gel-Foam slurry. The co-axial needle was removed and hemostasis was
obtained with manual compression. Post procedural scanning was
negative for definitive area of hemorrhage or additional
complication. A dressing was placed. The patient tolerated the
procedure well without immediate post procedural complication.

The patient tolerated the procedure well without immediate post
procedural complication.
FINDINGS: After catheter placement, the tip lies within the superior
cavoatrial junction. The catheter aspirates and flushes normally and
is ready for immediate use.

Ultrasound images demonstrate placement of the 18 gauge core needle
biopsy device within the central aspect of the dominant targeted
lesion within the medial segment of the left lobe of the liver.
IMPRESSION: 1. Successful placement of a right internal jugular approach power
injectable Port-A-Cath. The catheter is ready for immediate use.
2. Technically successful ultrasound-guided biopsy of dominant
lesion within the medial segment of the left lobe of the liver.
